# Patient Record
Sex: Female | Born: 1965
Health system: Southern US, Community
[De-identification: ages and names within clinical notes are randomized; demographics above are authoritative.]

## PROBLEM LIST (undated history)

## (undated) DIAGNOSIS — K644 Residual hemorrhoidal skin tags: Secondary | ICD-10-CM

## (undated) DIAGNOSIS — A159 Respiratory tuberculosis unspecified: Secondary | ICD-10-CM

## (undated) DIAGNOSIS — K648 Other hemorrhoids: Secondary | ICD-10-CM

## (undated) DIAGNOSIS — D509 Iron deficiency anemia, unspecified: Secondary | ICD-10-CM

## (undated) DIAGNOSIS — K56699 Other intestinal obstruction unspecified as to partial versus complete obstruction: Secondary | ICD-10-CM

## (undated) DIAGNOSIS — E538 Deficiency of other specified B group vitamins: Secondary | ICD-10-CM

## (undated) DIAGNOSIS — K602 Anal fissure, unspecified: Secondary | ICD-10-CM

## (undated) DIAGNOSIS — K5090 Crohn's disease, unspecified, without complications: Secondary | ICD-10-CM

## (undated) DIAGNOSIS — M1990 Unspecified osteoarthritis, unspecified site: Secondary | ICD-10-CM

## (undated) HISTORY — PX: APPENDECTOMY: SHX54

## (undated) HISTORY — DX: Residual hemorrhoidal skin tags: K64.4

## (undated) HISTORY — DX: Other hemorrhoids: K64.8

## (undated) HISTORY — DX: Gilbert syndrome: E80.4

## (undated) HISTORY — DX: Unspecified osteoarthritis, unspecified site: M19.90

## (undated) HISTORY — DX: Deficiency of other specified B group vitamins: E53.8

## (undated) HISTORY — PX: CHOLECYSTECTOMY: SHX55

## (undated) HISTORY — DX: Other intestinal obstruction unspecified as to partial versus complete obstruction: K56.699

## (undated) HISTORY — DX: Respiratory tuberculosis unspecified: A15.9

## (undated) HISTORY — PX: COLON RESECTION: SHX5231

## (undated) HISTORY — DX: Iron deficiency anemia, unspecified: D50.9

## (undated) HISTORY — PX: COLONOSCOPY: SHX174

## (undated) HISTORY — PX: UPPER GASTROINTESTINAL ENDOSCOPY: SHX188

## (undated) HISTORY — DX: Crohn's disease, unspecified, without complications: K50.90

## (undated) HISTORY — DX: Anal fissure, unspecified: K60.2

---

## 2000-03-22 HISTORY — PX: EXPLORATORY LAPAROTOMY: SUR591

## 2003-04-10 ENCOUNTER — Other Ambulatory Visit: Admission: RE | Admit: 2003-04-10 | Discharge: 2003-04-10 | Payer: Self-pay | Admitting: Obstetrics & Gynecology

## 2003-05-16 ENCOUNTER — Encounter: Admission: RE | Admit: 2003-05-16 | Discharge: 2003-05-16 | Payer: Self-pay | Admitting: Gastroenterology

## 2003-05-20 ENCOUNTER — Ambulatory Visit (HOSPITAL_COMMUNITY): Admission: RE | Admit: 2003-05-20 | Discharge: 2003-05-20 | Payer: Self-pay | Admitting: Gastroenterology

## 2003-05-20 ENCOUNTER — Encounter (INDEPENDENT_AMBULATORY_CARE_PROVIDER_SITE_OTHER): Payer: Self-pay | Admitting: *Deleted

## 2004-06-25 ENCOUNTER — Other Ambulatory Visit: Admission: RE | Admit: 2004-06-25 | Discharge: 2004-06-25 | Payer: Self-pay | Admitting: Obstetrics & Gynecology

## 2008-10-09 ENCOUNTER — Encounter: Admission: RE | Admit: 2008-10-09 | Discharge: 2008-10-09 | Payer: Self-pay

## 2009-05-15 ENCOUNTER — Ambulatory Visit (HOSPITAL_COMMUNITY): Admission: RE | Admit: 2009-05-15 | Discharge: 2009-05-15 | Payer: Self-pay | Admitting: Surgery

## 2010-02-10 ENCOUNTER — Encounter: Admission: RE | Admit: 2010-02-10 | Discharge: 2010-02-10 | Payer: Self-pay | Admitting: Obstetrics & Gynecology

## 2010-06-11 LAB — CBC
HCT: 40.6 % (ref 36.0–46.0)
Hemoglobin: 13.5 g/dL (ref 12.0–15.0)
MCHC: 33.3 g/dL (ref 30.0–36.0)
MCV: 93.2 fL (ref 78.0–100.0)
Platelets: 245 10*3/uL (ref 150–400)
RBC: 4.35 MIL/uL (ref 3.87–5.11)
RDW: 13.8 % (ref 11.5–15.5)
WBC: 9.1 10*3/uL (ref 4.0–10.5)

## 2010-06-11 LAB — COMPREHENSIVE METABOLIC PANEL
ALT: 12 U/L (ref 0–35)
AST: 20 U/L (ref 0–37)
Albumin: 3.3 g/dL — ABNORMAL LOW (ref 3.5–5.2)
Alkaline Phosphatase: 62 U/L (ref 39–117)
BUN: 9 mg/dL (ref 6–23)
CO2: 25 mEq/L (ref 19–32)
Calcium: 8.6 mg/dL (ref 8.4–10.5)
Chloride: 100 mEq/L (ref 96–112)
Creatinine, Ser: 0.67 mg/dL (ref 0.4–1.2)
GFR calc Af Amer: >60 mL/min (ref >60)
GFR calc non Af Amer: >60 mL/min (ref >60)
Glucose, Bld: 99 mg/dL (ref 70–99)
Potassium: 4 mEq/L (ref 3.5–5.1)
Sodium: 134 mEq/L — ABNORMAL LOW (ref 135–145)
Total Bilirubin: 1.2 mg/dL (ref 0.3–1.2)
Total Protein: 6.2 g/dL (ref 6.0–8.3)

## 2010-06-11 LAB — DIFFERENTIAL
Basophils Absolute: 0 10*3/uL (ref 0.0–0.1)
Basophils Relative: 0 % (ref 0–1)
Eosinophils Absolute: 0.1 10*3/uL (ref 0.0–0.7)
Eosinophils Relative: 1 % (ref 0–5)
Lymphocytes Relative: 24 % (ref 12–46)
Lymphs Abs: 2.1 10*3/uL (ref 0.7–4.0)
Monocytes Absolute: 0.8 10*3/uL (ref 0.1–1.0)
Monocytes Relative: 9 % (ref 3–12)
Neutro Abs: 6 10*3/uL (ref 1.7–7.7)
Neutrophils Relative %: 66 % (ref 43–77)

## 2010-08-07 NOTE — Op Note (Signed)
NAME:  Karen Knox, Karen Knox                       ACCOUNT NO.:  [id number]   MEDICAL RECORD NO.:  [id number]                   PATIENT TYPE:  AMB   LOCATION:  ENDO                                 FACILITY:  [facility]   PHYSICIAN:  Nelwyn Salisbury, M.D.               DATE OF BIRTH:  [birth year]   DATE OF PROCEDURE:  05/20/2003  DATE OF DISCHARGE:                                 OPERATIVE REPORT   PROCEDURE PERFORMED:  Colonoscopy with small bowel biopsies.   ENDOSCOPIST:  Nelwyn Salisbury, M.D.   INSTRUMENT USED:  Pediatric adjustable Olympus colonoscope.   INDICATIONS FOR PROCEDURE:  A 45 year old white female with a history of  Crohn's disease undergoing colonoscopy for what she thinks might be a recent  flare.  She is on Imuran and receives Remicade infusions every eight weeks.   PREPROCEDURE PREPARATION:  Informed consent was procured from the patient.  The patient was fasted for eight hours prior to the procedure and prepped  with a bottle of magnesium citrate and a gallon of GoLYTELY the night prior  to the procedure.   PREPROCEDURE PHYSICAL:  VITAL SIGNS:  The patient had stable vital signs.  NECK:  Supple.  CHEST: Clear to auscultation.  S1 and S2 regular.  ABDOMEN: Soft with normal bowel sounds.   DESCRIPTION OF PROCEDURE:  The patient was placed in the left lateral  decubitus position, sedated with 60 mg of Demerol and 6 mg of Versed  intravenously.  Once the patient was adequately sedated and maintained on  low flow oxygen and continuous cardiac monitoring, the Olympus video  colonoscope was advanced from the rectum to the anastomosis at 90 cm.  There  was ulceration at the anastomosis with scattered small bowel ulcerations  proximal to that.  Side-to-side anastomosis was noted.  Multiple biopsies of  these ulcers were done.  The entire colonic mucosa appeared healthy with  prominent internal hemorrhoids seen on retroflexion.  The patient tolerated  the procedure well without  immediate complications.   IMPRESSION:  1. Ulceration at anastomosis and in the distal small bowel.  Biopsies done.  2. Healthy-appearing colonic mucosa.  3. Prominent internal hemorrhoids.   RECOMMENDATIONS:  1. Await pathology results.  2. Follow up with upper GI and small bowel follow through results.  3. Outpatient follow up thereafter.  Further recommendations will be made     once the above-mentioned test results have been procured.  The patient     may require a surgical opinion for segmental resection if symptoms     worsen.                                               Nelwyn Salisbury, M.D.    JNM/MEDQ  D:  05/20/2003  T:  05/20/2003  Job:  [id number]   cc:   Thomos Lemons, M.D.  [employer]

## 2010-10-13 ENCOUNTER — Encounter: Payer: Self-pay | Admitting: Internal Medicine

## 2010-12-14 ENCOUNTER — Ambulatory Visit (INDEPENDENT_AMBULATORY_CARE_PROVIDER_SITE_OTHER): Payer: BC Managed Care – PPO | Admitting: Internal Medicine

## 2010-12-14 ENCOUNTER — Encounter: Payer: Self-pay | Admitting: Internal Medicine

## 2010-12-14 VITALS — BP 112/78 | HR 74 | Ht 62.0 in | Wt 112.0 lb

## 2010-12-14 DIAGNOSIS — R197 Diarrhea, unspecified: Secondary | ICD-10-CM

## 2010-12-14 DIAGNOSIS — K5010 Crohn's disease of large intestine without complications: Secondary | ICD-10-CM

## 2010-12-14 DIAGNOSIS — K5090 Crohn's disease, unspecified, without complications: Secondary | ICD-10-CM

## 2010-12-14 MED ORDER — HYDROCORTISONE ACE-PRAMOXINE 2.5-1 % RE CREA: TOPICAL_CREAM | Freq: Three times a day (TID) | RECTAL | Status: DC

## 2010-12-14 MED ORDER — PEG-KCL-NACL-NASULF-NA ASC-C 100 G PO SOLR: 1.0000 | Freq: Once | ORAL | Status: DC

## 2010-12-14 NOTE — Progress Notes (Signed)
Karen Knox 1965/09/06 MRN [id number]        History of Present Illness:  This is a 45 year old white female  status post terminal ileal resection in 1990 and subsequent  distal ileal resection in 2001 for anastomotic stricture. She has not seen a gastroenterologist in last 7 years. Colonoscopy in 2005 by Dr. Loreta Knox showed anastomotic Crohn's disease. She has been on Remicade for 9 years.at the dose of 5 mg per kilogram every 6 weeks administered by Dr. Ewell Poe office . She has chronic low-grade diarrhea. She denies aphthous stomatitis. She has had arthralgias controlled by Remicade. Her weight has remained stable. She has symptomatic hemorrhoids exacerbated by  diarrhea. There is a family history of colon cancer in her father. She had remote cholecystectomy in February 2011 call for cholelithiasis. She has been on B12 injections thousand micrograms every 2 weeks    Past Medical History  Diagnosis Date  . Anal fissure   . Crohn's disease   . Arthritis     ? IBD related?  . Small bowel stricture     multiple 07/05/00  . Gilbert's syndrome   . Iron deficiency anemia   . B12 deficiency   . External hemorrhoids   . Internal hemorrhoids    Past Surgical History  Procedure Date  . Exploratory laparotomy 2002    with extensive lysis of adhesions, resection of ileocolonic anastomosis,resection segment small bowel; anastomosis of small bowel  to small bowel; ileocolic anastomosis  . Appendectomy     with initial colon resection including terminal ileum  . Colon resection 1993  . Cholecystectomy     reports that she has never smoked. She has never used smokeless tobacco. She reports that she drinks alcohol. She reports that she does not use illicit drugs. family history includes Arthritis in her father; Colon cancer (age of onset:62) in her father; Melanoma in her mother; and Multiple sclerosis in her father. No Known Allergies      Review of Systems denies acid reflux.  Dysphagia odynophagia shortness of breath or chest pain   The remainder of the 10  point ROS is negative except as outlined in H&P   Physical Exam: General appearance  Well developed in no distress Eyes- non icteric HEENT nontraumatic, normocephalic Mouth no lesions, tongue papillated, no cheilosis Neck supple without adenopathy, thyroid not enlarged, no carotid bruits, no JVD Lungs Clear to auscultation bilaterally Cor normal S1 normal S2, regular rhythm , no murmur,  quiet precordium Abdomen  soft nontender with well-healed surgical scars. Hyperactive bowel sounds with soft abnormal rushes. Increased tympany throughout the abdomen  Rectal: large external skin tags. Normal rectal sphincter tone. Palpable nodularity in the rectal ampulla. Stool is Hemoccult negative. There is no fistula or drainage around the rectum  Extremities no pedal edema Skin no lesions Neurological alert and oriented x3 Psychological normal mood and  affect  ACrohn's disease of the terminal ileum status post remote resection x2. Doing reasonably well on Remicade. She will need a TB skin test. She is due for colonoscopy to assess the anastomosis. We talked about the use of probiotics a periodic antibiotics for bacterial overgrowth. She may not need B12 every 2 weeks but every month. She needs labs checked periodically. We prescribe Analpram cream 2.5% cream to use when necessary rectal irritations     12/14/2010 Karen Knox

## 2010-12-14 NOTE — Patient Instructions (Addendum)
You have been scheduled for a colonoscopy. Please follow written instructions given to you at your visit today.  Please pick up your Moviprep kit at the pharmacy within the next 2-3 days. We have sent the following medications to your pharmacy for you to pick up at your convenience: Analpram 2.5 % Please return here for your TB skin test reading between 48-72 hours from now. Cc: Dr Selena Batten

## 2010-12-15 ENCOUNTER — Encounter: Payer: Self-pay | Admitting: Internal Medicine

## 2011-01-25 ENCOUNTER — Ambulatory Visit (AMBULATORY_SURGERY_CENTER): Payer: BC Managed Care – PPO | Admitting: Internal Medicine

## 2011-01-25 ENCOUNTER — Encounter: Payer: Self-pay | Admitting: *Deleted

## 2011-01-25 ENCOUNTER — Encounter: Payer: Self-pay | Admitting: Internal Medicine

## 2011-01-25 DIAGNOSIS — K5190 Ulcerative colitis, unspecified, without complications: Secondary | ICD-10-CM

## 2011-01-25 DIAGNOSIS — K5289 Other specified noninfective gastroenteritis and colitis: Secondary | ICD-10-CM

## 2011-01-25 DIAGNOSIS — K5090 Crohn's disease, unspecified, without complications: Secondary | ICD-10-CM

## 2011-01-25 DIAGNOSIS — K5010 Crohn's disease of large intestine without complications: Secondary | ICD-10-CM

## 2011-01-25 DIAGNOSIS — R197 Diarrhea, unspecified: Secondary | ICD-10-CM

## 2011-01-25 DIAGNOSIS — K5080 Crohn's disease of both small and large intestine without complications: Secondary | ICD-10-CM

## 2011-01-25 MED ORDER — BUDESONIDE 3 MG PO CP24: 3.0000 mg | ORAL_CAPSULE | Freq: Three times a day (TID) | ORAL | Status: DC

## 2011-01-25 MED ORDER — SODIUM CHLORIDE 0.9 % IV SOLN: 500.0000 mL | INTRAVENOUS | Status: DC

## 2011-01-25 NOTE — Patient Instructions (Addendum)
Handouts given on crohns and polyps and hemorrhoids Discharge instructions per blue and green sheets Dr Juanda Chance did biopsies and we will mail you a letter in 1-2 weeks with the results and her recommendations.  At your next office visit you will discuss a more aggressive treatment with Dr Juanda Chance.  Repeat colonoscopy in 5 years- 2017. We will mail you a leter reminding you of this.  Call Dr Regino Schultze office tomorrow morning at [phone] to schedule your appointment for her next available time slot. To start entocort ec 3 mg 1 capsule by mouth 3 times a day.

## 2011-01-26 ENCOUNTER — Telehealth: Payer: Self-pay | Admitting: *Deleted

## 2011-01-26 NOTE — Telephone Encounter (Signed)
No answer. Message left on voicemail. 

## 2011-01-28 ENCOUNTER — Encounter: Payer: Self-pay | Admitting: Internal Medicine

## 2011-01-29 ENCOUNTER — Encounter: Payer: Self-pay | Admitting: Internal Medicine

## 2011-02-05 ENCOUNTER — Ambulatory Visit: Payer: BC Managed Care – PPO | Admitting: Internal Medicine

## 2011-02-26 ENCOUNTER — Ambulatory Visit (INDEPENDENT_AMBULATORY_CARE_PROVIDER_SITE_OTHER): Payer: BC Managed Care – PPO | Admitting: Internal Medicine

## 2011-02-26 ENCOUNTER — Other Ambulatory Visit: Payer: BC Managed Care – PPO

## 2011-02-26 ENCOUNTER — Encounter: Payer: Self-pay | Admitting: Internal Medicine

## 2011-02-26 VITALS — BP 136/60 | Ht 63.5 in | Wt 110.0 lb

## 2011-02-26 DIAGNOSIS — K5090 Crohn's disease, unspecified, without complications: Secondary | ICD-10-CM

## 2011-02-26 DIAGNOSIS — K5080 Crohn's disease of both small and large intestine without complications: Secondary | ICD-10-CM

## 2011-02-26 DIAGNOSIS — R197 Diarrhea, unspecified: Secondary | ICD-10-CM

## 2011-02-26 MED ORDER — CENTRUM SILVER PO TABS: 1.0000 | ORAL_TABLET | Freq: Every day | ORAL | Status: DC

## 2011-02-26 MED ORDER — MESALAMINE 1.2 G PO TBEC: DELAYED_RELEASE_TABLET | ORAL | Status: DC

## 2011-02-26 MED ORDER — INFLIXIMAB 100 MG IV SOLR: 10.0000 mg/kg | INTRAVENOUS | Status: DC

## 2011-02-26 MED ORDER — ALIGN 4 MG PO CAPS: 1.0000 | ORAL_CAPSULE | ORAL | Status: DC

## 2011-02-26 MED ORDER — CHOLESTYRAMINE 4 G PO PACK: 1.0000 | PACK | Freq: Every day | ORAL | Status: DC

## 2011-02-26 MED ORDER — ERGOCALCIFEROL 1.25 MG (50000 UT) PO CAPS: 50000.0000 [IU] | ORAL_CAPSULE | ORAL | Status: AC

## 2011-02-26 NOTE — Progress Notes (Signed)
Karen Knox Quant 1966-02-13 MRN [id number]    History of Present Illness:  This is a 45 year old white female with Crohn's disease of the terminal ileum who is post terminal ileal resection in 1990 and again in 2001. She is on Remicade 5 mg/kg every 6 weeks for the past 9 years. A recent colonoscopy 4 weeks ago showed Crohn's disease at the anastomosis. Biopsies from the distal ileum showed chronic active ileitis. Biopsies from the anastomosis showed ulcerated anastomotic mucosa with crypt distortion and patchy prominent plasma cells with neutrophilic infiltrate. Random biopsies from the colon showed patchy quiescent chronic colitis. A rectal polyp showed lymphoid hyperplasia. She has chronic diarrhea and occasional crampy abdominal pain. Her weight has been low but stable at around 112 pounds. She is here today to discuss increasing her treatment regimen for Crohn's disease. Her colonoscopy also showed mild narrowing in the distal ileum proximal to the anastomosis. She has been on Entocort 9 mg daily for 4 weeks without noticeable improvement of her symptoms. She was on Pentasa in the past but stopped taking it. She has used analpram cream for rectal Crohn's disease and uses B12 every 2 weeks.   Past Medical History  Diagnosis Date  . Anal fissure   . Crohn's disease   . Arthritis     ? IBD related?  . Small bowel stricture     multiple 07/05/00  . Gilbert's syndrome   . Iron deficiency anemia   . B12 deficiency   . External hemorrhoids   . Internal hemorrhoids    Past Surgical History  Procedure Date  . Exploratory laparotomy 2002    with extensive lysis of adhesions, resection of ileocolonic anastomosis,resection segment small bowel; anastomosis of small bowel  to small bowel; ileocolic anastomosis  . Appendectomy     with initial colon resection including terminal ileum  . Colon resection 1993  . Cholecystectomy     reports that she has never smoked. She has never used smokeless  tobacco. She reports that she drinks alcohol. She reports that she does not use illicit drugs. family history includes Arthritis in her father; Colon cancer (age of onset:62) in her father; Melanoma in her mother; and Multiple sclerosis in her father. No Known Allergies      Review of Systems: Denies dysphagia nausea shortness of breath or chest pain  The remainder of the 10 point ROS is negative except as outlined in H&P   Physical Exam: General appearance  Well developed, in no distress. Eyes- non icteric. HEENT nontraumatic, normocephalic. Mouth no lesions, tongue papillated, no cheilosis. No aphthous ulcers Neck supple without adenopathy, thyroid not enlarged, no carotid bruits, no JVD. Lungs Clear to auscultation bilaterally. Cor normal S1, normal S2, regular rhythm, no murmur,  quiet precordium. Abdomen: Soft relaxed with normoactive bowel sounds. No distention. Mild tenderness right lower quadrant. No mass effect. Rectal: Not repeated. Extremities no pedal edema. Skin no lesions., Thin hair and nails Neurological alert and oriented x 3. Psychological normal mood and affect.  Assessment and Plan:  Problem #1 Crohn's disease of the colon, anastomosis and terminal ileum as per recent colonoscopy. She also has involvement although not severe of the rectum. She has been on Remicade now for many years and needs to intensify her medical regimen. She has not noticed any improvement with Entocort. We will increase her Remicade to 10 mg/kg every 6 weeks. We are giving her a trial offLilda 1.2 g 4 tablets daily for the next 30 days. She will slowly  taper her Entocort to 6 mg for 4 weeks and subsequently 30 mg per 4 weeks before stopping it. He have discussed her diet and the need to be on a low-residue diet. We will call her in Questran 4 g packets daily at least 1 hour apart from other medications. She will continue her B12 every 4 weeks. She will also purchase protein supplements and  multiple vitamins with trace elements Centrum Silver. I have given her samples of a probiotic for bacteria overgrowth. We will check the vitamin D levels today and start her on vitamin D 50,000 units weekly for 12 weeks. I will see her again in 6 weeks.    02/26/2011 Karen Knox

## 2011-02-26 NOTE — Patient Instructions (Addendum)
We have sent the following medications to your pharmacy for you to pick up at your convenience: Questran 4 g packet once daily. Vitamin D 50,000 IU once weekly x 12 weeks We have given you samples of Align. This puts good bacteria back into your intestines. You should take 1 capsule by mouth once daily. If this works well for you, it can be purchased over the counter. We have given you samples of Lialda to take 4 tablets once daily. We will contact Dr Elmyra Ricks office with new Remicade orders for 10 mg/kg every 6 weeks. CC: Dr Selena Batten

## 2011-02-27 LAB — VITAMIN D 25 HYDROXY (VIT D DEFICIENCY, FRACTURES): Vit D, 25-Hydroxy: 47 ng/mL (ref 30–89)

## 2011-03-01 ENCOUNTER — Telehealth: Payer: Self-pay | Admitting: *Deleted

## 2011-03-01 NOTE — Telephone Encounter (Signed)
I have spoken to Dr Elmyra Ricks nurse, Dewayne Hatch to advise her that Dr Juanda Chance would like patient to begin on Remicade 10 mg/kg every 6 weeks instead of 5 mg/kg every 6 weeks. Ann verbalizes understanding and is aware that I have sent an updated order to their office.

## 2011-03-04 ENCOUNTER — Other Ambulatory Visit: Payer: Self-pay | Admitting: Internal Medicine

## 2011-03-26 ENCOUNTER — Telehealth: Payer: Self-pay | Admitting: Internal Medicine

## 2011-03-26 NOTE — Telephone Encounter (Signed)
Left voicemail for patient that she needs an appointment in order to get refills. Dr Juanda Chance wanted to see her 6 weeks after her last appointment which was on 02/26/11.

## 2011-03-28 ENCOUNTER — Other Ambulatory Visit: Payer: Self-pay | Admitting: Internal Medicine

## 2011-03-29 ENCOUNTER — Other Ambulatory Visit: Payer: Self-pay | Admitting: Internal Medicine

## 2011-03-29 MED ORDER — MESALAMINE 1.2 G PO TBEC: DELAYED_RELEASE_TABLET | ORAL | Status: DC

## 2011-03-29 NOTE — Telephone Encounter (Signed)
rx sent

## 2011-04-08 ENCOUNTER — Telehealth: Payer: Self-pay | Admitting: *Deleted

## 2011-04-08 NOTE — Telephone Encounter (Signed)
Received a phone call that Dr. Vincenza Hews Anderson's office does not have a copy of th orders sent to them on 03/01/11. Refaxed orders to [phone].

## 2011-04-20 ENCOUNTER — Other Ambulatory Visit (INDEPENDENT_AMBULATORY_CARE_PROVIDER_SITE_OTHER): Payer: BC Managed Care – PPO

## 2011-04-20 ENCOUNTER — Ambulatory Visit (INDEPENDENT_AMBULATORY_CARE_PROVIDER_SITE_OTHER): Payer: BC Managed Care – PPO | Admitting: Internal Medicine

## 2011-04-20 ENCOUNTER — Encounter: Payer: Self-pay | Admitting: Internal Medicine

## 2011-04-20 VITALS — BP 118/64 | HR 76 | Ht 63.0 in | Wt 110.0 lb

## 2011-04-20 DIAGNOSIS — K5090 Crohn's disease, unspecified, without complications: Secondary | ICD-10-CM

## 2011-04-20 DIAGNOSIS — Z79899 Other long term (current) drug therapy: Secondary | ICD-10-CM

## 2011-04-20 DIAGNOSIS — D849 Immunodeficiency, unspecified: Secondary | ICD-10-CM

## 2011-04-20 DIAGNOSIS — K5080 Crohn's disease of both small and large intestine without complications: Secondary | ICD-10-CM

## 2011-04-20 LAB — COMPREHENSIVE METABOLIC PANEL
ALT: 22 U/L (ref 0–35)
AST: 19 U/L (ref 0–37)
Albumin: 3.4 g/dL — ABNORMAL LOW (ref 3.5–5.2)
Alkaline Phosphatase: 53 U/L (ref 39–117)
BUN: 13 mg/dL (ref 6–23)
CO2: 27 mEq/L (ref 19–32)
Calcium: 8.6 mg/dL (ref 8.4–10.5)
Chloride: 105 mEq/L (ref 96–112)
Creatinine, Ser: 0.6 mg/dL (ref 0.4–1.2)
GFR: 107.48 mL/min (ref >60.00)
Glucose, Bld: 80 mg/dL (ref 70–99)
Potassium: 3.2 mEq/L — ABNORMAL LOW (ref 3.5–5.1)
Sodium: 139 mEq/L (ref 135–145)
Total Bilirubin: 2 mg/dL — ABNORMAL HIGH (ref 0.3–1.2)
Total Protein: 6.8 g/dL (ref 6.0–8.3)

## 2011-04-20 LAB — CBC WITH DIFFERENTIAL/PLATELET
Basophils Absolute: 0 10*3/uL (ref 0.0–0.1)
Basophils Relative: 0.7 % (ref 0.0–3.0)
Eosinophils Absolute: 0.1 10*3/uL (ref 0.0–0.7)
Eosinophils Relative: 1.4 % (ref 0.0–5.0)
HCT: 41.7 % (ref 36.0–46.0)
Hemoglobin: 13.8 g/dL (ref 12.0–15.0)
Lymphocytes Relative: 36.5 % (ref 12.0–46.0)
Lymphs Abs: 2 10*3/uL (ref 0.7–4.0)
MCHC: 33.1 g/dL (ref 30.0–36.0)
MCV: 87.7 fl (ref 78.0–100.0)
Monocytes Absolute: 0.6 10*3/uL (ref 0.1–1.0)
Monocytes Relative: 10.1 % (ref 3.0–12.0)
Neutro Abs: 2.9 10*3/uL (ref 1.4–7.7)
Neutrophils Relative %: 51.3 % (ref 43.0–77.0)
Platelets: 328 10*3/uL (ref 150.0–400.0)
RBC: 4.76 Mil/uL (ref 3.87–5.11)
RDW: 14.7 % — ABNORMAL HIGH (ref 11.5–14.6)
WBC: 5.6 10*3/uL (ref 4.5–10.5)

## 2011-04-20 LAB — SEDIMENTATION RATE: Sed Rate: 10 mm/hr (ref 0–22)

## 2011-04-20 MED ORDER — HYDROCORTISONE ACE-PRAMOXINE 2.5-1 % RE CREA: TOPICAL_CREAM | Freq: Three times a day (TID) | RECTAL | Status: DC

## 2011-04-20 NOTE — Patient Instructions (Addendum)
Your physician has requested that you go to the basement for the following lab work before leaving today: CBC, CMET, Sedimentation Rate We have sent the following medications to your pharmacy for you to pick up at your convenience: Analpram Please return to see Dr Juanda Chance in 3 months. CC: Dr Pearson Grippe

## 2011-04-20 NOTE — Progress Notes (Signed)
Karen Knox May 09, 1965 MRN [id number]    History of Present Illness:  This is a 46 year old white female with Crohn's disease at the ileocolic anastomosis and of the colon as per a recent colonoscopy in November 2012 which showed chronic active ileitis, ulcerated mucosa with crypt distortion and patchy prominent plasma cells as well as neutrophils. Random biopsies of the colon showed patchy quiescent colitis. The anastomosis was mildly stenosed. She had a prior terminal ileal resection in 1996 and again in 2001. We have increased her Remicade to 10 mg/kg every 8 weeks. Her Entocort has been tapered down to 3 mg a day. She is feeling overall better. The mild joint pains in her hands and her level of energy has been better,. The diarrhea remains about the same. She has not taken any Questran. She continues on mesalamine 4.8 g daily. She is on B12 supplements every 4 weeks, probiotics, vitamin D and Analpram cream.   Past Medical History  Diagnosis Date  . Anal fissure   . Crohn's disease   . Arthritis     ? IBD related?  . Small bowel stricture     multiple 07/05/00  . Gilbert's syndrome   . Iron deficiency anemia   . B12 deficiency   . External hemorrhoids   . Internal hemorrhoids    Past Surgical History  Procedure Date  . Exploratory laparotomy 2002    with extensive lysis of adhesions, resection of ileocolonic anastomosis,resection segment small bowel; anastomosis of small bowel  to small bowel; ileocolic anastomosis  . Appendectomy     with initial colon resection including terminal ileum  . Colon resection 1993  . Cholecystectomy     reports that she has never smoked. She has never used smokeless tobacco. She reports that she drinks alcohol. She reports that she does not use illicit drugs. family history includes Arthritis in her father; Colon cancer (age of onset:62) in her father; Melanoma in her mother; and Multiple sclerosis in her father. No Known Allergies       Review of Systems: Positive for diarrhea which has been improved on a low residue diet. Denies abdominal pain. Denies nausea. Weight has been stable  The remainder of the 10 point ROS is negative except as outlined in H&P   Physical Exam: General appearance  Well developed, in no distress. Eyes- non icteric. HEENT nontraumatic, normocephalic. Mouth no lesions, tongue papillated, no cheilosis. Neck supple without adenopathy, thyroid not enlarged, no carotid bruits, no JVD. Lungs Clear to auscultation bilaterally. Cor normal S1, normal S2, regular rhythm, no murmur,  quiet precordium. Abdomen: Soft abdomen with minimal tenderness in right lower quadrant and normal active bowel sounds. No distention. Rectal not done. Extremities no pedal edema., swollen right MCP joint right hand. Skin no lesions. Neurological alert and oriented x 3. Psychological normal mood and affect.  Assessment and Plan:  Problem #1 Active Crohn's colitis and ileitis at the ileocolonic anastomosis which has been resected twice. She is on complex medical therapy which she has been tolerating well. She will taper off her Entocort. She will continue all other medications. We will recheck her labs today and we will see her again in 3 months.   04/20/2011 Karen Knox

## 2011-04-21 ENCOUNTER — Telehealth: Payer: Self-pay | Admitting: *Deleted

## 2011-04-21 MED ORDER — POTASSIUM CHLORIDE CRYS ER 20 MEQ PO TBCR: EXTENDED_RELEASE_TABLET | ORAL | Status: DC

## 2011-04-21 NOTE — Telephone Encounter (Signed)
Spoke with patient and gave her results and recommendations. Rx to pharmacy.

## 2011-04-21 NOTE — Telephone Encounter (Signed)
Message copied by Daphine Deutscher on Wed Apr 21, 2011  2:39 PM ------      Message from: Hart Carwin      Created: Tue Apr 20, 2011  9:52 PM       Please call pt with normal blood tests except for low K+, due to diarrhea. Please call in [facility] , take 1 poqd x 1 week, #301 refill, , may repeat in case of severe diarrhea

## 2011-04-29 ENCOUNTER — Other Ambulatory Visit: Payer: Self-pay | Admitting: Internal Medicine

## 2011-06-08 ENCOUNTER — Other Ambulatory Visit: Payer: Self-pay | Admitting: Internal Medicine

## 2011-06-17 ENCOUNTER — Other Ambulatory Visit: Payer: Self-pay | Admitting: Internal Medicine

## 2011-07-02 ENCOUNTER — Other Ambulatory Visit: Payer: Self-pay | Admitting: Internal Medicine

## 2011-09-18 ENCOUNTER — Other Ambulatory Visit: Payer: Self-pay | Admitting: Internal Medicine

## 2011-09-21 ENCOUNTER — Other Ambulatory Visit: Payer: Self-pay | Admitting: Internal Medicine

## 2011-09-21 MED ORDER — MESALAMINE 1.2 G PO TBEC: DELAYED_RELEASE_TABLET | ORAL | Status: DC

## 2011-09-21 NOTE — Telephone Encounter (Signed)
rx sent

## 2011-10-22 ENCOUNTER — Ambulatory Visit: Payer: BC Managed Care – PPO

## 2011-10-22 ENCOUNTER — Ambulatory Visit (INDEPENDENT_AMBULATORY_CARE_PROVIDER_SITE_OTHER): Payer: BC Managed Care – PPO | Admitting: Internal Medicine

## 2011-10-22 ENCOUNTER — Encounter: Payer: Self-pay | Admitting: Internal Medicine

## 2011-10-22 VITALS — BP 138/80 | HR 107 | Ht 63.0 in | Wt 111.4 lb

## 2011-10-22 DIAGNOSIS — K5090 Crohn's disease, unspecified, without complications: Secondary | ICD-10-CM

## 2011-10-22 LAB — CBC WITH DIFFERENTIAL/PLATELET
Basophils Absolute: 0 10*3/uL (ref 0.0–0.1)
Basophils Relative: 0 % (ref 0–1)
Eosinophils Absolute: 0.1 10*3/uL (ref 0.0–0.7)
Eosinophils Relative: 1 % (ref 0–5)
HCT: 42 % (ref 36.0–46.0)
Hemoglobin: 13.7 g/dL (ref 12.0–15.0)
Lymphocytes Relative: 33 % (ref 12–46)
Lymphs Abs: 3.2 10*3/uL (ref 0.7–4.0)
MCH: 28.6 pg (ref 26.0–34.0)
MCHC: 32.6 g/dL (ref 30.0–36.0)
MCV: 87.7 fL (ref 78.0–100.0)
Monocytes Absolute: 1.1 10*3/uL — ABNORMAL HIGH (ref 0.1–1.0)
Monocytes Relative: 11 % (ref 3–12)
Neutro Abs: 5.4 10*3/uL (ref 1.7–7.7)
Neutrophils Relative %: 55 % (ref 43–77)
Platelets: 326 10*3/uL (ref 150–400)
RBC: 4.79 MIL/uL (ref 3.87–5.11)
RDW: 14.1 % (ref 11.5–15.5)
WBC: 9.9 10*3/uL (ref 4.0–10.5)

## 2011-10-22 LAB — SEDIMENTATION RATE: Sed Rate: 4 mm/hr (ref 0–22)

## 2011-10-22 MED ORDER — MESALAMINE 1.2 G PO TBEC: DELAYED_RELEASE_TABLET | ORAL | Status: DC

## 2011-10-22 MED ORDER — BUDESONIDE 3 MG PO CP24: ORAL_CAPSULE | ORAL | Status: DC

## 2011-10-22 MED ORDER — HYDROCORTISONE ACE-PRAMOXINE 2.5-1 % RE CREA: TOPICAL_CREAM | Freq: Three times a day (TID) | RECTAL | Status: DC

## 2011-10-22 NOTE — Progress Notes (Signed)
Karen Knox 1965-10-16 MRN [id number]    History of Present Illness:  This is a 46 year old white female with Crohn's disease of the terminal ileum who comes for refills of her medications. Her last appointment was in January 2013. She has an ileocolic anastomosis and is status post terminal ileum resection in 1996 and 2001. She also had a cholecystectomy. A colonoscopy in November 2012 showed chronic active ileitis, ulcerated mucosa with crypt distortion and patchy plasma cell and neutrophilic infiltrate in the colon. She has been on Remicade infusions 10 mg/kg every 6 weeks. She has chronic diarrhea which at times gets worse but she usually doesn't take anything other than occasional Questran. In addition to Remicade infusions, she is also on mesalamine 4.8 g daily. In the last several weeks, she has had  increased pain in right lower quadrant. She denies any obstructive symptoms, increased nausea, vomiting or pain. She has occasional irritation of the rectum secondary to diarrhea and she uses topical steroids for it. There is a history of Gilbert syndrome. Her weight has been stable. She is on B12 supplements monthly.   Past Medical History  Diagnosis Date  . Anal fissure   . Crohn's disease   . Arthritis     ? IBD related?  . Small bowel stricture     multiple 07/05/00  . Gilbert's syndrome   . Iron deficiency anemia   . B12 deficiency   . External hemorrhoids   . Internal hemorrhoids    Past Surgical History  Procedure Date  . Exploratory laparotomy 2002    with extensive lysis of adhesions, resection of ileocolonic anastomosis,resection segment small bowel; anastomosis of small bowel  to small bowel; ileocolic anastomosis  . Appendectomy     with initial colon resection including terminal ileum  . Colon resection 1993  . Cholecystectomy     reports that she has never smoked. She has never used smokeless tobacco. She reports that she drinks alcohol. She reports that she does  not use illicit drugs. family history includes Arthritis in her father; Colon cancer (age of onset:62) in her father; Melanoma in her mother; and Multiple sclerosis in her father. No Known Allergies      Review of Systems: Negative for reflux dysphagia  The remainder of the 10 point ROS is negative except as outlined in H&P   Physical Exam: General appearance  Well developed, in no distress. Eyes- non icteric. HEENT nontraumatic, normocephalic. Mouth no lesions, tongue papillated, no cheilosis. Neck supple without adenopathy, thyroid not enlarged, no carotid bruits, no JVD. Lungs Clear to auscultation bilaterally. Cor normal S1, normal S2, regular rhythm, no murmur,  quiet precordium. Abdomen: Very tender right lower quadrant with hyperactive bowel sounds. No rebound. Well-healed surgical scar. No fluid wave. Rectal: External hemorrhoidal tag. Normal rectal sphincter tone with nodule on anal canal secondary to Crohn's disease. Extremities no pedal edema. Skin no lesions. Neurological alert and oriented x 3. Psychological normal mood and affect.  Assessment and Plan:  Problem #1 Crohn's disease of the ileocolic anastomosis and of the colon and distal ileum she is on Remicade infusions and mesalamine. She has had a mild exacerbation recently. We will start her Entocort 9 mg daily for 2 weeks then down to 6 mg for 2 weeks then 3 mg for 2 weeks. We will refill her Analpram cream and her mesalamine. She will have labs today to check on her potassium, blood count and I will see her in 6 months. Her next Remicade infusion  will be August 23. She follows with Dr. Dareen Piano for IBD arthropathy.   10/22/2011 [name]

## 2011-10-22 NOTE — Patient Instructions (Addendum)
Your physician has requested that you go to the basement for the following lab work before leaving today: CMET, Sed Rate, CBC We have sent the following medications to your pharmacy for you to pick up at your convenience: Analpram Lialda Entocort Please follow up with Dr Juanda Chance in 6 months. CC: Dr Pearson Grippe, Dr Dareen Piano

## 2011-10-23 ENCOUNTER — Encounter: Payer: Self-pay | Admitting: Internal Medicine

## 2011-10-23 LAB — COMPREHENSIVE METABOLIC PANEL
ALT: 13 U/L (ref 0–35)
AST: 17 U/L (ref 0–37)
Albumin: 3.8 g/dL (ref 3.5–5.2)
Alkaline Phosphatase: 51 U/L (ref 39–117)
BUN: 11 mg/dL (ref 6–23)
CO2: 24 mEq/L (ref 19–32)
Calcium: 8.7 mg/dL (ref 8.4–10.5)
Chloride: 103 mEq/L (ref 96–112)
Creat: 0.6 mg/dL (ref 0.50–1.10)
Glucose, Bld: 63 mg/dL — ABNORMAL LOW (ref 70–99)
Potassium: 3.4 mEq/L — ABNORMAL LOW (ref 3.5–5.3)
Sodium: 138 mEq/L (ref 135–145)
Total Bilirubin: 2.2 mg/dL — ABNORMAL HIGH (ref 0.3–1.2)
Total Protein: 6.6 g/dL (ref 6.0–8.3)

## 2012-06-29 ENCOUNTER — Other Ambulatory Visit: Payer: Self-pay | Admitting: Internal Medicine

## 2012-09-08 ENCOUNTER — Other Ambulatory Visit: Payer: Self-pay | Admitting: Internal Medicine

## 2012-09-08 NOTE — Telephone Encounter (Signed)
NEEDS OFFICE VISIT FOR ANY FURTHER REFILLS! 

## 2012-11-14 ENCOUNTER — Other Ambulatory Visit: Payer: Self-pay | Admitting: Internal Medicine

## 2012-12-05 ENCOUNTER — Ambulatory Visit (INDEPENDENT_AMBULATORY_CARE_PROVIDER_SITE_OTHER): Payer: BC Managed Care – PPO | Admitting: Internal Medicine

## 2012-12-05 ENCOUNTER — Other Ambulatory Visit (INDEPENDENT_AMBULATORY_CARE_PROVIDER_SITE_OTHER): Payer: BC Managed Care – PPO

## 2012-12-05 ENCOUNTER — Encounter: Payer: Self-pay | Admitting: Internal Medicine

## 2012-12-05 VITALS — BP 130/84 | HR 60 | Ht 63.0 in | Wt 110.4 lb

## 2012-12-05 DIAGNOSIS — R197 Diarrhea, unspecified: Secondary | ICD-10-CM

## 2012-12-05 DIAGNOSIS — D849 Immunodeficiency, unspecified: Secondary | ICD-10-CM

## 2012-12-05 DIAGNOSIS — K5080 Crohn's disease of both small and large intestine without complications: Secondary | ICD-10-CM

## 2012-12-05 LAB — VITAMIN B12: Vitamin B-12: 838 pg/mL (ref 211–911)

## 2012-12-05 LAB — IBC PANEL
Iron: 50 ug/dL (ref 42–145)
Saturation Ratios: 10.1 % — ABNORMAL LOW (ref 20.0–50.0)
Transferrin: 353.2 mg/dL (ref 212.0–360.0)

## 2012-12-05 LAB — COMPREHENSIVE METABOLIC PANEL
ALT: 20 U/L (ref 0–35)
AST: 17 U/L (ref 0–37)
Albumin: 3.5 g/dL (ref 3.5–5.2)
Alkaline Phosphatase: 60 U/L (ref 39–117)
BUN: 11 mg/dL (ref 6–23)
CO2: 30 mEq/L (ref 19–32)
Calcium: 8.4 mg/dL (ref 8.4–10.5)
Chloride: 100 mEq/L (ref 96–112)
Creatinine, Ser: 0.7 mg/dL (ref 0.4–1.2)
GFR: 90.22 mL/min (ref >60.00)
Glucose, Bld: 95 mg/dL (ref 70–99)
Potassium: 3.7 mEq/L (ref 3.5–5.1)
Sodium: 136 mEq/L (ref 135–145)
Total Bilirubin: 1.5 mg/dL — ABNORMAL HIGH (ref 0.3–1.2)
Total Protein: 7 g/dL (ref 6.0–8.3)

## 2012-12-05 LAB — SEDIMENTATION RATE: Sed Rate: 14 mm/hr (ref 0–22)

## 2012-12-05 LAB — HEPATITIS B SURFACE ANTIGEN: Hepatitis B Surface Ag: NEGATIVE

## 2012-12-05 LAB — HEPATITIS B SURFACE ANTIBODY,QUALITATIVE: Hep B S Ab: NEGATIVE

## 2012-12-05 MED ORDER — POTASSIUM CHLORIDE CRYS ER 20 MEQ PO TBCR: EXTENDED_RELEASE_TABLET | ORAL | Status: DC

## 2012-12-05 MED ORDER — MESALAMINE 1.2 G PO TBEC: DELAYED_RELEASE_TABLET | ORAL | Status: DC

## 2012-12-05 MED ORDER — HYDROCORTISONE ACE-PRAMOXINE 2.5-1 % RE CREA: TOPICAL_CREAM | Freq: Three times a day (TID) | RECTAL | Status: DC

## 2012-12-05 NOTE — Patient Instructions (Addendum)
We have sent the following medications to your pharmacy for you to pick up at your convenience: Analpram Lialda Potassium  Your physician has requested that you go to the basement for the following lab work before leaving today: Hepatitis A and B serologies, B12, IBC, Sed Rate, CMET  We will call you with a TB skin test date.   CC: Dr Azzie Roup

## 2012-12-05 NOTE — Progress Notes (Signed)
Karen Knox June 02, 1965 MRN [id number]  History of Present Illness:  This is a 47 year old white female with known Crohn's disease of the terminal ileum, the rectum and colon. She had a terminal ileal resection in 1992 and again in June 2002 in [facility]. She also had a laparoscopic cholecystectomy. Patient has been on Remicade since 03/22/2001. Most recently, she has been on 10 mg/kg every 6 weeks. She has  IBD arthropathy, B12 deficiency and a history of anal fissures. She is having 4-5 loose bowel movements a day. Prior colonoscopies in April 2002, February 2003, February 2005 and November 2012 showed chronic active colitis and ulcerated mucosa with crypt distortion neutrophilic infiltrate consistent with active colitis at the anastomosis. She is doing well. Her weight has been stable. She denies rectal bleeding. She just completed a bone density. She also had a pneumococcal vaccine in 2013. She needs TB skin testing and updated blood tests. She needs a refill on her mesalamine, cortisone cream and potassium supplements.   Past Medical History  Diagnosis Date  . Anal fissure   . Crohn's disease   . Arthritis     ? IBD related?  . Small bowel stricture     multiple 07/05/00  . Gilbert's syndrome   . Iron deficiency anemia   . B12 deficiency   . External hemorrhoids   . Internal hemorrhoids    Past Surgical History  Procedure Laterality Date  . Exploratory laparotomy  2002    with extensive lysis of adhesions, resection of ileocolonic anastomosis,resection segment small bowel; anastomosis of small bowel  to small bowel; ileocolic anastomosis  . Appendectomy      with initial colon resection including terminal ileum  . Colon resection  1993  . Cholecystectomy      reports that she has never smoked. She has never used smokeless tobacco. She reports that  drinks alcohol. She reports that she does not use illicit drugs. family history includes Arthritis in her father;  Colon cancer (age of onset: 17) in her father; Melanoma in her mother; Multiple sclerosis in her father. No Known Allergies      Review of Systems: Denies heartburn dysphagia rectal bleeding  The remainder of the 10 point ROS is negative except as outlined in H&P   Physical Exam: General appearance  Well developed, in no distress. Eyes- non icteric. HEENT nontraumatic, normocephalic. Mouth no lesions, tongue papillated, no cheilosis. Neck supple without adenopathy, thyroid not enlarged, no carotid bruits, no JVD. Lungs Clear to auscultation bilaterally. Cor normal S1, normal S2, regular rhythm, no murmur,  quiet precordium. Abdomen: Soft with good muscle tone. Tenderness in right lower quadrant. No mass, hyperactive bowel sounds. Rectal: External skin tags. Normal rectal sphincter tone slightly tender. Stool is Hemoccult negative. Extremities no pedal edema. Skin no lesions. Neurological alert and oriented x 3. Psychological normal mood and affect.  Assessment and Plan:  Problem #4 47 year old white female with stable Crohn's disease of the terminal ileum and colon . She will continue on Remicade infusions. We will check on her hepatitis serologies and refill mesalamine, hydrocortisone cream and potassium. We will also check B12 levels, iron levels and liver function tests. A TB skin test will be applied when we get the serum in.   12/05/2012 Karen Knox

## 2012-12-06 ENCOUNTER — Other Ambulatory Visit: Payer: Self-pay | Admitting: *Deleted

## 2012-12-06 DIAGNOSIS — D509 Iron deficiency anemia, unspecified: Secondary | ICD-10-CM

## 2012-12-06 LAB — HEPATITIS A ANTIBODY, TOTAL: Hep A Total Ab: NEGATIVE

## 2012-12-22 ENCOUNTER — Telehealth: Payer: Self-pay | Admitting: Internal Medicine

## 2012-12-22 ENCOUNTER — Ambulatory Visit (INDEPENDENT_AMBULATORY_CARE_PROVIDER_SITE_OTHER): Payer: BC Managed Care – PPO | Admitting: Internal Medicine

## 2012-12-22 DIAGNOSIS — K529 Noninfective gastroenteritis and colitis, unspecified: Secondary | ICD-10-CM

## 2012-12-22 DIAGNOSIS — K5289 Other specified noninfective gastroenteritis and colitis: Secondary | ICD-10-CM

## 2012-12-22 MED ORDER — TUBERCULIN PPD 5 UNIT/0.1ML ID SOLN: 5.0000 [IU] | Freq: Once | INTRADERMAL | Status: DC

## 2012-12-22 NOTE — Telephone Encounter (Signed)
Spoke to Robin at [facility]. Advised her that patient states that she got a tube of analpram that says "#60 grams" but is on a 30 gram tube and she only got 1 tube. Advised that patient should have received 2 tubes. Zella Ball states that she will get an additional tube of Analpram ready for the patien to pick up at no charge. Patient also advised of this and I asked that in the future, she open the medication bags while at the pharmacy to make certain that she truly is getting 60 grams. She verbalizes understanding.

## 2013-02-01 ENCOUNTER — Telehealth: Payer: Self-pay | Admitting: *Deleted

## 2013-02-01 NOTE — Telephone Encounter (Signed)
Message copied by Daphine Deutscher on Thu Feb 01, 2013  8:24 AM ------      Message from: Daphine Deutscher      Created: Wed Dec 06, 2012  9:10 AM       Call and remind patient due for IBC panel for DB on 02/05/13. Lab in EPIC ------

## 2013-02-01 NOTE — Telephone Encounter (Signed)
Spoke with patient and she just had labs drawn at [facility]. She will see if they drew an iron panel or if they will add one and send [place] the results. If they cannot do this, she will come for labs here next week.

## 2013-02-21 ENCOUNTER — Telehealth: Payer: Self-pay | Admitting: *Deleted

## 2013-02-21 NOTE — Telephone Encounter (Signed)
Spoke with patient and she will come for labs sometime in the near future. Her PCP did not draw it.

## 2013-02-21 NOTE — Telephone Encounter (Signed)
Message copied by Daphine Deutscher on Wed Feb 21, 2013  2:26 PM ------      Message from: Daphine Deutscher      Created: Thu Feb 01, 2013  8:29 AM       Did patient have labs for DB or have them faxed from [facility]? ------

## 2013-03-08 ENCOUNTER — Other Ambulatory Visit: Payer: Self-pay | Admitting: Internal Medicine

## 2013-04-09 ENCOUNTER — Encounter: Payer: Self-pay | Admitting: *Deleted

## 2013-04-20 ENCOUNTER — Telehealth: Payer: Self-pay | Admitting: *Deleted

## 2013-04-20 NOTE — Telephone Encounter (Signed)
Patient was due for iron studies. Called and mailed a letter to patient. She has not had labs done.

## 2013-04-25 ENCOUNTER — Other Ambulatory Visit (INDEPENDENT_AMBULATORY_CARE_PROVIDER_SITE_OTHER): Payer: BC Managed Care – PPO

## 2013-04-25 DIAGNOSIS — D509 Iron deficiency anemia, unspecified: Secondary | ICD-10-CM

## 2013-04-25 LAB — IBC PANEL
Iron: 80 ug/dL (ref 42–145)
Saturation Ratios: 17.9 % — ABNORMAL LOW (ref 20.0–50.0)
Transferrin: 319.4 mg/dL (ref 212.0–360.0)

## 2013-06-17 ENCOUNTER — Other Ambulatory Visit: Payer: Self-pay | Admitting: Internal Medicine

## 2013-09-18 ENCOUNTER — Other Ambulatory Visit: Payer: Self-pay | Admitting: Internal Medicine

## 2013-09-26 ENCOUNTER — Other Ambulatory Visit: Payer: Self-pay | Admitting: Internal Medicine

## 2013-12-25 ENCOUNTER — Ambulatory Visit (INDEPENDENT_AMBULATORY_CARE_PROVIDER_SITE_OTHER): Payer: BC Managed Care – PPO | Admitting: Internal Medicine

## 2013-12-25 ENCOUNTER — Other Ambulatory Visit (INDEPENDENT_AMBULATORY_CARE_PROVIDER_SITE_OTHER): Payer: BC Managed Care – PPO

## 2013-12-25 ENCOUNTER — Encounter: Payer: Self-pay | Admitting: Internal Medicine

## 2013-12-25 VITALS — BP 126/84 | HR 68 | Ht 63.0 in | Wt 114.4 lb

## 2013-12-25 DIAGNOSIS — K5080 Crohn's disease of both small and large intestine without complications: Secondary | ICD-10-CM

## 2013-12-25 DIAGNOSIS — R195 Other fecal abnormalities: Secondary | ICD-10-CM

## 2013-12-25 DIAGNOSIS — K5010 Crohn's disease of large intestine without complications: Secondary | ICD-10-CM

## 2013-12-25 DIAGNOSIS — Z9225 Personal history of immunosupression therapy: Secondary | ICD-10-CM

## 2013-12-25 LAB — CBC WITH DIFFERENTIAL/PLATELET
Basophils Absolute: 0 10*3/uL (ref 0.0–0.1)
Basophils Relative: 0.4 % (ref 0.0–3.0)
Eosinophils Absolute: 0.1 10*3/uL (ref 0.0–0.7)
Eosinophils Relative: 1.5 % (ref 0.0–5.0)
HCT: 42 % (ref 36.0–46.0)
Hemoglobin: 14 g/dL (ref 12.0–15.0)
Lymphocytes Relative: 32 % (ref 12.0–46.0)
Lymphs Abs: 2.4 10*3/uL (ref 0.7–4.0)
MCHC: 33.4 g/dL (ref 30.0–36.0)
MCV: 88.4 fl (ref 78.0–100.0)
Monocytes Absolute: 0.7 10*3/uL (ref 0.1–1.0)
Monocytes Relative: 8.7 % (ref 3.0–12.0)
Neutro Abs: 4.3 10*3/uL (ref 1.4–7.7)
Neutrophils Relative %: 57.4 % (ref 43.0–77.0)
Platelets: 356 10*3/uL (ref 150.0–400.0)
RBC: 4.75 Mil/uL (ref 3.87–5.11)
RDW: 13.5 % (ref 11.5–15.5)
WBC: 7.5 10*3/uL (ref 4.0–10.5)

## 2013-12-25 LAB — SEDIMENTATION RATE: Sed Rate: 10 mm/hr (ref 0–22)

## 2013-12-25 MED ORDER — POTASSIUM CHLORIDE CRYS ER 20 MEQ PO TBCR: 20.0000 meq | EXTENDED_RELEASE_TABLET | Freq: Every day | ORAL | Status: DC

## 2013-12-25 MED ORDER — CYANOCOBALAMIN 1000 MCG/ML IJ SOLN: 1000.0000 ug | INTRAMUSCULAR | Status: DC

## 2013-12-25 MED ORDER — MESALAMINE 1.2 G PO TBEC: DELAYED_RELEASE_TABLET | ORAL | Status: DC

## 2013-12-25 MED ORDER — HYDROCORTISONE ACE-PRAMOXINE 2.5-1 % RE CREA: TOPICAL_CREAM | Freq: Three times a day (TID) | RECTAL | Status: DC

## 2013-12-25 NOTE — Progress Notes (Signed)
Karen Knox 10/24/1965 [id number]  Note: This dictation was prepared with Dragon digital system. Any transcriptional errors that result from this procedure are unintentional.   History of Present Illness: This is a 48 year old white female with Crohn's disease of the terminal ileum and colon. She had a terminal ileum resection in 1992 and again in June 2002 at [facility]. She has been on Remicade 10 mg/kg every 6 weeks since January 2003. She has IBD arthropathy. Patient has a history of a prior cholecystectomy. Her prior colonoscopies were in 2002, 2003, 2005 and in November 2012 and showed chronic active colitis as well as ileitis including the ileocolic anastomosis. There is a family history of colon cancer in her father. Her last lab work in January 2015 showed an iron saturation of 17% and B12 level of 838. She is here today for refills of her medications. She has chronic diarrhea. Her last office visit was in September 2014. She has no specific complaints today. There has been no rectal bleeding, abdominal pain or weight loss. She is having 2-3 loose bowel movements a day.    Past Medical History  Diagnosis Date  . Anal fissure   . Crohn's disease   . Arthritis     ? IBD related?  . Small bowel stricture     multiple 07/05/00  . Gilbert's syndrome   . Iron deficiency anemia   . B12 deficiency   . External hemorrhoids   . Internal hemorrhoids     Past Surgical History  Procedure Laterality Date  . Exploratory laparotomy  2002    with extensive lysis of adhesions, resection of ileocolonic anastomosis,resection segment small bowel; anastomosis of small bowel  to small bowel; ileocolic anastomosis  . Appendectomy      with initial colon resection including terminal ileum  . Colon resection  1993  . Cholecystectomy      No Known Allergies  Family history and social history have been reviewed.  Review of Systems:   The remainder of the 10 point ROS is  negative except as outlined in the H&P  Physical Exam: General Appearance Well developed, in no distress Eyes  Non icteric  HEENT  Non traumatic, normocephalic  Mouth No lesion, tongue papillated, no cheilosis Neck Supple without adenopathy, thyroid not enlarged, no carotid bruits, no JVD Lungs Clear to auscultation bilaterally COR Normal S1, normal S2, regular rhythm, no murmur, quiet precordium Abdomen hyperactive bowel sounds. Soft mild diffuse tenderness greater in the right lower quadrant. No palpable mass. Well-healed surgical scars Rectal external skin tags consistent with Crohn's disease. Normal rectal sphincter tone. Hemoccult-positive stool Extremities  No pedal edema Skin No lesions Neurological Alert and oriented x 3 Psychological Normal mood and affect  Assessment and Plan:   Problem #691 48 year old white female with Crohn's colitis, ileitis and rectal involvement who is doing well on Remicade 10 mg/kg every 6 weeks and mesalamine 4.8 g daily. She needs a TB skin test today. She needs all labs repeated today  including  blood count, B12, iron studies, sedimentation rate, metabolic panel and TSH. We will refill her medications which include B12injectable,, Analpram cream, mesalamine, and potassium.    Karen Knox 12/25/2013

## 2013-12-25 NOTE — Patient Instructions (Signed)
We have sent the following medications to your pharmacy for you to pick up at your convenience: B12 Analpram Lialda Potassium  We have given you a TB skin test today. Please make certain to come back to the office for a reading between 48-72 hours from now to avoid requiring repeat testing.  Your physician has requested that you go to the basement for the following lab work before leaving today: CBC, B12, IBC, CMET, SED rate, TSH  CC:Dr Pearson Grippe

## 2013-12-26 LAB — COMPREHENSIVE METABOLIC PANEL
ALT: 13 U/L (ref 0–35)
AST: 19 U/L (ref 0–37)
Albumin: 3.5 g/dL (ref 3.5–5.2)
Alkaline Phosphatase: 56 U/L (ref 39–117)
BUN: 10 mg/dL (ref 6–23)
CO2: 26 mEq/L (ref 19–32)
Calcium: 8.9 mg/dL (ref 8.4–10.5)
Chloride: 104 mEq/L (ref 96–112)
Creatinine, Ser: 0.6 mg/dL (ref 0.4–1.2)
GFR: 106.18 mL/min (ref >60.00)
Glucose, Bld: 46 mg/dL — CL (ref 70–99)
Potassium: 4.5 mEq/L (ref 3.5–5.1)
Sodium: 139 mEq/L (ref 135–145)
Total Bilirubin: 1.4 mg/dL — ABNORMAL HIGH (ref 0.2–1.2)
Total Protein: 7 g/dL (ref 6.0–8.3)

## 2013-12-26 LAB — IBC PANEL
Iron: 79 ug/dL (ref 42–145)
Saturation Ratios: 16.5 % — ABNORMAL LOW (ref 20.0–50.0)
Transferrin: 342.8 mg/dL (ref 212.0–360.0)

## 2013-12-26 LAB — VITAMIN B12: Vitamin B-12: 311 pg/mL (ref 211–911)

## 2013-12-26 LAB — TSH: TSH: 1.24 u[IU]/mL (ref 0.35–4.50)

## 2013-12-27 LAB — TB SKIN TEST
Induration: 0 mm
TB Skin Test: NEGATIVE

## 2013-12-28 ENCOUNTER — Telehealth: Payer: Self-pay | Admitting: *Deleted

## 2013-12-28 NOTE — Telephone Encounter (Signed)
Spoke with patient and she is calling back because she would like Dr. Juanda Chance to recommend a doctor for her to see about the low blood sugar. She states her PCP Dr. Selena Batten has not done anything about it in the past. She has had several lab tests with low blood sugar and wants to have it worked up further. Please, advise.

## 2013-12-29 NOTE — Telephone Encounter (Signed)
Dr Urbano Heir with [facility] is an excellent endoscrinologist,I recommend her.

## 2013-12-31 NOTE — Telephone Encounter (Signed)
Left a message for patient to call back. 

## 2013-12-31 NOTE — Telephone Encounter (Signed)
Patient given Dr. Brodie's recommendation. 

## 2014-01-21 ENCOUNTER — Telehealth: Payer: Self-pay | Admitting: *Deleted

## 2014-01-21 MED ORDER — POTASSIUM CHLORIDE CRYS ER 20 MEQ PO TBCR: 20.0000 meq | EXTENDED_RELEASE_TABLET | Freq: Every day | ORAL | Status: DC

## 2014-01-21 NOTE — Telephone Encounter (Signed)
Patient requests refills on Klor Con. Okay to continue filling?

## 2014-01-21 NOTE — Telephone Encounter (Signed)
Rx sent 

## 2014-01-21 NOTE — Telephone Encounter (Signed)
OK to refill Clor Kon.

## 2014-02-07 ENCOUNTER — Other Ambulatory Visit: Payer: Self-pay | Admitting: Obstetrics & Gynecology

## 2014-02-11 LAB — CYTOLOGY - PAP

## 2014-03-13 ENCOUNTER — Other Ambulatory Visit: Payer: Self-pay | Admitting: Internal Medicine

## 2014-04-03 ENCOUNTER — Telehealth: Payer: Self-pay | Admitting: Internal Medicine

## 2014-04-03 NOTE — Telephone Encounter (Signed)
I have sent a discount card for Lialda to patient's home address. I have left a voicemail to advise patient.

## 2014-05-07 ENCOUNTER — Other Ambulatory Visit: Payer: Self-pay | Admitting: Internal Medicine

## 2014-05-16 ENCOUNTER — Other Ambulatory Visit: Payer: Self-pay | Admitting: Internal Medicine

## 2014-05-16 ENCOUNTER — Telehealth: Payer: Self-pay | Admitting: Internal Medicine

## 2014-05-16 NOTE — Telephone Encounter (Signed)
Talked to the patient at 10:55 am on 05/16/14 regarding needing a refill on their B-12 shots. Jesse Fall, RN, said that since the patient's PCP has changed how they are taking their B-12, their PCP needs to put the refill in for them. I explained this to the patient and she stated she understood.

## 2014-06-09 ENCOUNTER — Other Ambulatory Visit: Payer: Self-pay | Admitting: Internal Medicine

## 2014-11-28 ENCOUNTER — Telehealth: Payer: Self-pay | Admitting: *Deleted

## 2014-11-28 NOTE — Telephone Encounter (Signed)
Left a message for patient to call back. (Needs new GI MD) 

## 2014-11-28 NOTE — Telephone Encounter (Signed)
Spoke with patient and scheduled and OV with Dr. Lavon Paganini.

## 2015-01-17 ENCOUNTER — Ambulatory Visit
Admission: RE | Admit: 2015-01-17 | Discharge: 2015-01-17 | Disposition: A | Discharge status: Home / Self Care | Payer: No Typology Code available for payment source | Source: Ambulatory Visit | Attending: Infectious Disease | Admitting: Infectious Disease

## 2015-01-17 ENCOUNTER — Other Ambulatory Visit: Payer: Self-pay | Admitting: Infectious Disease

## 2015-01-17 DIAGNOSIS — R7611 Nonspecific reaction to tuberculin skin test without active tuberculosis: Secondary | ICD-10-CM

## 2015-01-24 ENCOUNTER — Ambulatory Visit (INDEPENDENT_AMBULATORY_CARE_PROVIDER_SITE_OTHER)
Admission: RE | Admit: 2015-01-24 | Discharge: 2015-01-24 | Disposition: A | Discharge status: Home / Self Care | Payer: BLUE CROSS/BLUE SHIELD | Source: Ambulatory Visit | Attending: Gastroenterology | Admitting: Gastroenterology

## 2015-01-24 ENCOUNTER — Other Ambulatory Visit (INDEPENDENT_AMBULATORY_CARE_PROVIDER_SITE_OTHER): Payer: BLUE CROSS/BLUE SHIELD

## 2015-01-24 ENCOUNTER — Encounter: Payer: Self-pay | Admitting: Gastroenterology

## 2015-01-24 ENCOUNTER — Ambulatory Visit (INDEPENDENT_AMBULATORY_CARE_PROVIDER_SITE_OTHER): Payer: BLUE CROSS/BLUE SHIELD | Admitting: Gastroenterology

## 2015-01-24 VITALS — BP 110/60 | HR 66 | Ht 63.0 in | Wt 114.8 lb

## 2015-01-24 DIAGNOSIS — R7612 Nonspecific reaction to cell mediated immunity measurement of gamma interferon antigen response without active tuberculosis: Secondary | ICD-10-CM | POA: Diagnosis not present

## 2015-01-24 DIAGNOSIS — D649 Anemia, unspecified: Secondary | ICD-10-CM | POA: Diagnosis not present

## 2015-01-24 DIAGNOSIS — K5090 Crohn's disease, unspecified, without complications: Secondary | ICD-10-CM | POA: Diagnosis not present

## 2015-01-24 LAB — CBC WITH DIFFERENTIAL/PLATELET
Basophils Absolute: 0 10*3/uL (ref 0.0–0.1)
Basophils Relative: 0.2 % (ref 0.0–3.0)
Eosinophils Absolute: 0.1 10*3/uL (ref 0.0–0.7)
Eosinophils Relative: 1.2 % (ref 0.0–5.0)
HCT: 40.2 % (ref 36.0–46.0)
Hemoglobin: 13.2 g/dL (ref 12.0–15.0)
Lymphocytes Relative: 34.2 % (ref 12.0–46.0)
Lymphs Abs: 3.5 10*3/uL (ref 0.7–4.0)
MCHC: 32.8 g/dL (ref 30.0–36.0)
MCV: 88.5 fl (ref 78.0–100.0)
Monocytes Absolute: 1 10*3/uL (ref 0.1–1.0)
Monocytes Relative: 10.1 % (ref 3.0–12.0)
Neutro Abs: 5.5 10*3/uL (ref 1.4–7.7)
Neutrophils Relative %: 54.3 % (ref 43.0–77.0)
Platelets: 341 10*3/uL (ref 150.0–400.0)
RBC: 4.54 Mil/uL (ref 3.87–5.11)
RDW: 13.8 % (ref 11.5–15.5)
WBC: 10.1 10*3/uL (ref 4.0–10.5)

## 2015-01-24 LAB — BASIC METABOLIC PANEL
BUN: 9 mg/dL (ref 6–23)
CO2: 29 mEq/L (ref 19–32)
Calcium: 8.7 mg/dL (ref 8.4–10.5)
Chloride: 104 mEq/L (ref 96–112)
Creatinine, Ser: 0.64 mg/dL (ref 0.40–1.20)
GFR: 105.68 mL/min (ref >60.00)
Glucose, Bld: 77 mg/dL (ref 70–99)
Potassium: 3.5 mEq/L (ref 3.5–5.1)
Sodium: 138 mEq/L (ref 135–145)

## 2015-01-24 LAB — HEPATIC FUNCTION PANEL
ALT: 19 U/L (ref 0–35)
AST: 15 U/L (ref 0–37)
Albumin: 3.5 g/dL (ref 3.5–5.2)
Alkaline Phosphatase: 57 U/L (ref 39–117)
Bilirubin, Direct: 0.2 mg/dL (ref 0.0–0.3)
Total Bilirubin: 1.2 mg/dL (ref 0.2–1.2)
Total Protein: 6.6 g/dL (ref 6.0–8.3)

## 2015-01-24 LAB — VITAMIN B12: Vitamin B-12: 346 pg/mL (ref 211–911)

## 2015-01-24 LAB — HIGH SENSITIVITY CRP: CRP, High Sensitivity: 1.34 mg/L (ref 0.000–5.000)

## 2015-01-24 LAB — SEDIMENTATION RATE: Sed Rate: 16 mm/hr (ref 0–22)

## 2015-01-24 LAB — FOLATE: Folate: 14.4 ng/mL (ref >5.9)

## 2015-01-24 LAB — FERRITIN: Ferritin: 3.7 ng/mL — ABNORMAL LOW (ref 10.0–291.0)

## 2015-01-24 NOTE — Patient Instructions (Signed)
Go to the basement for labs and chest xray We will refer you to pulmonary and contact you with that appointment We will contact [facility] for your records

## 2015-01-24 NOTE — Progress Notes (Signed)
Karen Knox    [id number]    1965-08-05  Primary Care Physician:KIM, Fayrene Fearing, MD  Referring Physician: Pearson Grippe, MD [address] [place], [place] [address]  Chief complaint:  Crohn's disease  HPI:  49 year old white female status post terminal ileal resection in 1990 and subsequent distal ileal resection in 2001 for anastomotic stricture currently in remission on Remicade 5 mg per kilogram every 6 weeks administered by Dr. Tawana Scale office . She had positive quantiferon TB gold and recently sero converted. Denies any night sweat or fevers. No weight loss. No cough or sputum production. No h/o exposure to TB. Per patient she was referred to ID by Dr Shawnee Knapp office to latent TB and she was sent to [facility] clinic for evaluation instead. She had a CXR there for report not available yet.   Outpatient Encounter Prescriptions as of 01/24/2015  Medication Sig  . cyanocobalamin (,VITAMIN B-12,) 1000 MCG/ML injection Inject 1 mL (1,000 mcg total) into the muscle every 30 (thirty) days.  . hydrocortisone-pramoxine (ANALPRAM-HC) 2.5-1 % rectal cream Place rectally 3 (three) times daily.  Marland Kitchen inFLIXimab (REMICADE) 100 MG injection Inject 500 mg into the vein every 6 (six) weeks.  Marland Kitchen LIALDA 1.2 G EC tablet TAKE 4 TABLETS BY MOUTH EVERY DAY  . potassium chloride SA (K-DUR,KLOR-CON) 20 MEQ tablet Take 1 tablet (20 mEq total) by mouth daily.  . [DISCONTINUED] LIALDA 1.2 G EC tablet TAKE 4 TABLETS BY MOUTH EVERY DAY   Facility-Administered Encounter Medications as of 01/24/2015  Medication  . 0.9 %  sodium chloride infusion  . tuberculin injection 5 Units    Allergies as of 01/24/2015  . (No Known Allergies)    Past Medical History  Diagnosis Date  . Anal fissure   . Crohn's disease (HCC)   . Arthritis     ? IBD related?  . Small bowel stricture (HCC)     multiple 07/05/00  . Gilbert's syndrome   . Iron deficiency anemia   . B12 deficiency   . External  hemorrhoids   . Internal hemorrhoids   . TB (tuberculosis)     Past Surgical History  Procedure Laterality Date  . Exploratory laparotomy  2002    with extensive lysis of adhesions, resection of ileocolonic anastomosis,resection segment small bowel; anastomosis of small bowel  to small bowel; ileocolic anastomosis  . Appendectomy      with initial colon resection including terminal ileum  . Colon resection  1993  . Cholecystectomy      Family History  Problem Relation Age of Onset  . Arthritis Father     and on mothers side  . Multiple sclerosis Father   . Colon cancer Father 26  . Melanoma Mother     Social History   Social History  . Marital Status: Legally Separated    Spouse Name: N/A  . Number of Children: 2  . Years of Education: N/A   Occupational History  . [employer]     Social History Main Topics  . Smoking status: Never Smoker   . Smokeless tobacco: Never Used  . Alcohol Use: Yes     Comment: occasional  . Drug Use: No  . Sexual Activity: Not on file   Other Topics Concern  . Not on file   Social History Narrative      Review of systems: Review of Systems  Constitutional: Negative for fever and chills.  HENT: Negative.  Eyes: Negative for blurred vision.  Respiratory: Negative for cough, shortness of breath and wheezing.   Cardiovascular: Negative for chest pain and palpitations.  Gastrointestinal: as per HPI Genitourinary: Negative for dysuria, urgency, frequency and hematuria.  Musculoskeletal: Negative for myalgias, back pain and joint pain.  Skin: Negative for itching and rash.  Neurological: Negative for dizziness, tremors, focal weakness, seizures and loss of consciousness.  Endo/Heme/Allergies: Negative for environmental allergies.  Psychiatric/Behavioral: Negative for depression, suicidal ideas and hallucinations.  All other systems reviewed and are negative.   Physical Exam: Filed Vitals:   01/24/15 1408  BP: 110/60    Pulse: 66   Gen:      No acute distress HEENT:  EOMI, sclera anicteric Neck:     No masses; no thyromegaly Lungs:    Clear to auscultation bilaterally; normal respiratory effort CV:         Regular rate and rhythm; no murmurs Abd:      + bowel sounds; soft, non-tender; no palpable masses, no distension Ext:    No edema; adequate peripheral perfusion Skin:      Warm and dry; no rash Neuro: alert and oriented x 3 Psych: normal mood and affect  Data Reviewed: Colonoscopy 2012: Distal ileal stricture Evidence of colitis near ileo colonic anastamosis   Assessment and Plan/Recommendations:  49 year old female wit history of Crohn's disease currently on Remicade 5 mg/kgevery 6 weeks with positive quantiferon TB. Patient has no symptoms of active pulmonary TB Obtain CXR and Referral to pulmonary for latent TB Due for next dose of Remicade on Nov 16, we may need to hold Remicade. Will discuss her case with IBD specialist  [name] , MD [phone] Mon-Fri 8a-5p [phone] after 5p, weekends, holidays

## 2015-01-27 LAB — QUANTIFERON TB GOLD ASSAY (BLOOD)
Interferon Gamma Release Assay: POSITIVE — AB
Mitogen value: >10 IU/mL
Quantiferon Nil Value: 2.26 IU/mL
Quantiferon Tb Ag Minus Nil Value: 0.64 IU/mL
TB Ag value: 2.9 IU/mL

## 2015-01-28 ENCOUNTER — Telehealth: Payer: Self-pay | Admitting: Gastroenterology

## 2015-01-29 DIAGNOSIS — K5010 Crohn's disease of large intestine without complications: Secondary | ICD-10-CM | POA: Insufficient documentation

## 2015-01-29 NOTE — Telephone Encounter (Signed)
Please advise on her recent labs

## 2015-01-29 NOTE — Telephone Encounter (Signed)
Patient calling back regarding this. Best # [phone]

## 2015-01-29 NOTE — Telephone Encounter (Signed)
The patient had her Rheumatologist fax the results from the Quantiferon Gold test done by his office.

## 2015-01-31 ENCOUNTER — Other Ambulatory Visit: Payer: Self-pay | Admitting: Internal Medicine

## 2015-01-31 DIAGNOSIS — Z227 Latent tuberculosis: Secondary | ICD-10-CM

## 2015-01-31 MED ORDER — ISONIAZID 100 MG PO TABS: 200.0000 mg | ORAL_TABLET | ORAL | Status: DC

## 2015-01-31 MED ORDER — RIFAPENTINE 150 MG PO TABS: 900.0000 mg | ORAL_TABLET | ORAL | Status: DC

## 2015-01-31 MED ORDER — VITAMIN B-6 100 MG PO TABS: 100.0000 mg | ORAL_TABLET | Freq: Every day | ORAL | Status: DC

## 2015-01-31 MED ORDER — ISONIAZID 300 MG PO TABS: 600.0000 mg | ORAL_TABLET | ORAL | Status: DC

## 2015-01-31 MED ORDER — ONDANSETRON 8 MG PO TBDP: 8.0000 mg | ORAL_TABLET | Freq: Three times a day (TID) | ORAL | Status: DC | PRN

## 2015-01-31 NOTE — Progress Notes (Unsigned)
Karen Knox is a 49yo F with crohn's disease s/p colon resection, who is on every 6 wk remicade infusion. Her last evaluation for LTBI showed + quantiferon. She has clean cxr. No hx of TB exposure. No cough, weight loss, nightsweats .  Discussed risk of reactivation due to anti-TNF treatment, LTBI treatment options.  For now: we will try modified DOT of rifapentin-INH once a week x 12 weeks. To start on 01/31/2015  She weighs 115 lb, 52.3 kg  Will prescribe: zofran 8mg  ODT x 20 tabs to use prn Inh 800 mg rifapentin 900 mg Vit b 6 daily x 12 wk

## 2015-02-03 ENCOUNTER — Telehealth: Payer: Self-pay

## 2015-02-03 NOTE — Telephone Encounter (Signed)
Patient has moved her appointment to 02/19/15. She will call is she acutely worsens before this.

## 2015-02-03 NOTE — Telephone Encounter (Signed)
-----   Message from Napoleon Form, MD sent at 01/31/2015 11:34 AM EST ----- Regarding: RE: + quantiferon on remicade Thank you, we will try to reschedule her remicade infusion a week or if no significant worsening of symptoms for 2 weeks later.  Tyrone Nine ----- Message -----    From: Judyann Munson, MD    Sent: 01/31/2015  10:51 AM      To: Iva Boop, MD, Marlowe Kays, CMA, # Subject: RE: + quantiferon on remicade                  Bufford Spikes,  I spoke to [facility] regarding LTBI treatment. In an ideal scenario, I would like her to get 2 months of LTBI before her infusion. Given that she gets symptomatic when she gets out of a 6 wk remicade schedule, lets plan to push out this coming infusion 02/03/15 -> til the following week, possibly the 28th if she has relatively little symptoms.  In the meantime, I will start her on 12 weekly doses of rifapentin-INH which is the shortest LTBI course, but often the most intolerable SE.  She is to call me mid to late week to see how her GI symptoms are doing to decide if can push out a 2nd week.  Thanks. Call if questions [phone].  Aram Beecham ----- Message -----    From: Napoleon Form, MD    Sent: 01/30/2015   9:42 AM      To: Iva Boop, MD, Marlowe Kays, CMA, # Subject: RE: + quantiferon on remicade                  Thank you Dr Drue Second for willing to see her soon. She is due for her Remicade infusion on 02/03/15, should we hold or go ahead with it. She has not done well in the past when she skipped Remicade or it was delayed Scherry Ran  ----- Message -----    From: Iva Boop, MD    Sent: 01/29/2015   4:59 PM      To: Marlowe Kays, CMA, Judyann Munson, MD, # Subject: + quantiferon on remicade                      This is the lady that has the latent TB that I texted you about Aram Beecham.  I am ccing Veena and her CMA Zella Ball about this - thank you for being willing to see her.  Baldo Ash

## 2015-02-06 ENCOUNTER — Encounter: Payer: Self-pay | Admitting: Internal Medicine

## 2015-02-06 ENCOUNTER — Ambulatory Visit (INDEPENDENT_AMBULATORY_CARE_PROVIDER_SITE_OTHER): Payer: BLUE CROSS/BLUE SHIELD | Admitting: Internal Medicine

## 2015-02-06 VITALS — BP 121/81 | HR 64 | Temp 98.8°F | Wt 112.0 lb

## 2015-02-06 DIAGNOSIS — R7611 Nonspecific reaction to tuberculin skin test without active tuberculosis: Secondary | ICD-10-CM

## 2015-02-06 DIAGNOSIS — R110 Nausea: Secondary | ICD-10-CM

## 2015-02-06 DIAGNOSIS — Z227 Latent tuberculosis: Secondary | ICD-10-CM

## 2015-02-06 DIAGNOSIS — K50119 Crohn's disease of large intestine with unspecified complications: Secondary | ICD-10-CM | POA: Diagnosis not present

## 2015-02-06 NOTE — Progress Notes (Signed)
RFV: referral for latent TB Subjective:    Patient ID: Karen Knox, female    DOB: 10/10/1965, 49 y.o.   MRN: [id number]  HPI 49 yo F with crohns colitis, which she was diagnosed at the age of 65, she has had 2 surgeries with small and large bowel resection. She has been on remicaide for greater than 6 years. She usually gets her infusions every 6 weeks since she become symptomatic between 7-8 wks time frame. In her annual visits and screening for LTBI, she was positive by quantiferon this year whereas it had been negative in the years past. She did have a skin test in the last 21 months which was negative. We discussed various treatment options for LTBI and decided upon doing the 12 -weekly dosing regimen with INH plus rifapentin plus vit b6. She took her first dose of 12 dose of INH and rifapentin on 11/14. She premedicated with zofran which helped. She has noticed more frequent diarrhea, nad worsening headache that she is attributing to her menstrual cycle.  She is worried to delaying her remicade infusion too long since she would have significant symptoms. She is currently scheduled to have remicade infusion on 11/30  Tb risk =  No travel outside of [place]. 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   No Known Allergies Current Outpatient Prescriptions on File Prior to Visit  Medication Sig Dispense Refill  . cyanocobalamin (,VITAMIN B-12,) 1000 MCG/ML injection Inject 1 mL (1,000 mcg total) into the muscle every 30 (thirty) days. 10 mL 0  . hydrocortisone-pramoxine (ANALPRAM-HC) 2.5-1 % rectal cream Place rectally 3 (three) times daily. 60 g 2  . inFLIXimab (REMICADE) 100 MG injection Inject 500 mg into the vein every 6 (six) weeks. 1 each 3  . isoniazid (NYDRAZID) 100 MG tablet Take 2 tablets (200 mg total) by mouth once a week. In addn to 2 tab of  isoniazid for a total of  24  tablet 0  . isoniazid (NYDRAZID) 300 MG tablet Take 2 tablets (600 mg total) by mouth once a week. In addition to 2 tabsof  isoniazid for a total of  24 tablet 0  . LIALDA 1.2 G EC tablet TAKE 4 TABLETS BY MOUTH EVERY DAY 120 tablet 2  . ondansetron (ZOFRAN ODT) 8 MG disintegrating tablet Take 1 tablet (8 mg total) by mouth every 8 (eight) hours as needed for nausea or vomiting. 20 tablet 1  . potassium chloride SA (K-DUR,KLOR-CON) 20 MEQ tablet Take 1 tablet (20 mEq total) by mouth daily. 30 tablet 1  . pyridOXINE (VITAMIN B-6) 100 MG tablet Take 1 tablet (100 mg total) by mouth daily. 90 tablet 0  . Rifapentine 150 MG TABS Take 6 tablets (900 mg total) by mouth once a week. 72 each 0   Current Facility-Administered Medications on File Prior to Visit  Medication Dose Route Frequency Provider Last Rate Last Dose  . 0.9 %  sodium chloride infusion  500 mL Intravenous Continuous Hart Carwin, MD      . tuberculin injection 5 Units  5 Units Intradermal Once Hart Carwin, MD       Active Ambulatory Problems    Diagnosis Date Noted  . Crohn's colitis (HCC) 01/29/2015   Resolved Ambulatory Problems    Diagnosis Date Noted  . No Resolved Ambulatory Problems   Past Medical History  Diagnosis Date  . Anal  fissure   . Crohn's disease (HCC)   . Arthritis   . Small bowel stricture (HCC)   . Gilbert's syndrome   . Iron deficiency anemia   . B12 deficiency   . External hemorrhoids   . Internal hemorrhoids   . TB (tuberculosis)    - 2 surgeries, small and large bowel resection  Social History  Substance Use Topics  . Smoking status: Never Smoker   . Smokeless tobacco: Never Used  . Alcohol Use: Yes     Comment: occasional   family history includes Arthritis in her father; Colon cancer (age of onset: 22) in her father; Melanoma in her mother; Multiple sclerosis in her father.  Review of Systems + headache, nausea. Frequent loose bowel. Usually 3 times- usually, but recently  5-6 times (more frequent, less volume), no abdominal cramping, no blood in stool  Otherwise 10 point ros is negative    Objective:   Physical Exam  BP 121/81 mmHg  Pulse 64  Temp(Src) 98.8 F (37.1 C) (Oral)  Wt 112 lb (50.803 kg)  LMP 01/09/2015 Physical Exam  Constitutional:  oriented to person, place, and time. appears well-developed and well-nourished. No distress.  HENT: [place]/AT, PERRLA, no scleral icterus Mouth/Throat: Oropharynx is clear and moist. No oropharyngeal exudate.  Cardiovascular: Normal rate, regular rhythm and normal heart sounds. Exam reveals no gallop and no friction rub.  No murmur heard.  Pulmonary/Chest: Effort normal and breath sounds normal. No respiratory distress.  has no wheezes.  Neck = supple, no nuchal rigidity Abdominal: Soft. Bowel sounds are normal.  exhibits no distension. mils tenderness in RLQ Lymphadenopathy: no cervical adenopathy. No axillary adenopathy Neurological: alert and oriented to person, place, and time.  Skin: Skin is warm and dry. No rash noted. No erythema.  Psychiatric: a normal mood and affect.  behavior is normal.      Assessment & Plan:  Latent TB = will do a 12 wkly dose regimen of rifapentin plus inh, to take daily vit b6 during this period. She will be taking the meds every Monday night. She has tolerated her first of 12 doses. Ideally would like to have her 4 wk on LTBI before getting humira, will have to weigh her GI symptoms to see if she could tolerate any further postponement. Patient will continue to text after each dose for updates  For now, she will have had 3 doses prior to her humira infusion.  Will get monthly cmp while on ltbi treatment  Nausea = continue to premedicate with zofran and use as needed  Crohn's = plan for next infusion on 11/30

## 2015-02-24 ENCOUNTER — Telehealth: Payer: Self-pay | Admitting: *Deleted

## 2015-02-24 MED ORDER — MESALAMINE 1.2 G PO TBEC: 4.8000 g | DELAYED_RELEASE_TABLET | Freq: Every day | ORAL | Status: DC

## 2015-02-24 NOTE — Telephone Encounter (Signed)
Medication Lialda sent per fax request from pharmacy

## 2015-03-03 ENCOUNTER — Other Ambulatory Visit: Payer: Self-pay | Admitting: Obstetrics & Gynecology

## 2015-03-03 DIAGNOSIS — N6310 Unspecified lump in the right breast, unspecified quadrant: Secondary | ICD-10-CM

## 2015-03-07 ENCOUNTER — Other Ambulatory Visit: Payer: BLUE CROSS/BLUE SHIELD

## 2015-03-18 ENCOUNTER — Other Ambulatory Visit: Payer: Self-pay | Admitting: Internal Medicine

## 2015-04-16 ENCOUNTER — Telehealth: Payer: Self-pay | Admitting: *Deleted

## 2015-04-16 NOTE — Telephone Encounter (Signed)
Faxed office note from 11/17 to [facility] at [phone] per their request. Andree Coss, RN

## 2015-05-11 ENCOUNTER — Other Ambulatory Visit: Payer: Self-pay | Admitting: Internal Medicine

## 2015-11-10 ENCOUNTER — Encounter: Payer: Self-pay | Admitting: Gastroenterology

## 2016-01-05 ENCOUNTER — Encounter: Payer: Self-pay | Admitting: Gastroenterology

## 2016-01-13 ENCOUNTER — Other Ambulatory Visit: Payer: Self-pay | Admitting: *Deleted

## 2016-01-13 MED ORDER — HYDROCORTISONE ACE-PRAMOXINE 2.5-1 % RE CREA: TOPICAL_CREAM | Freq: Three times a day (TID) | RECTAL | 2 refills | Status: DC

## 2016-01-13 NOTE — Telephone Encounter (Signed)
ANAL PRAM SENT PER FAX REQUEST FROM PHARMACY

## 2016-01-16 ENCOUNTER — Other Ambulatory Visit: Payer: Self-pay | Admitting: *Deleted

## 2016-01-16 NOTE — Telephone Encounter (Signed)
Fax request for Analpram but was already refilled on 01/13/2016

## 2016-02-07 ENCOUNTER — Other Ambulatory Visit: Payer: Self-pay | Admitting: Gastroenterology

## 2016-03-10 ENCOUNTER — Other Ambulatory Visit: Payer: Self-pay | Admitting: Obstetrics & Gynecology

## 2016-03-10 ENCOUNTER — Other Ambulatory Visit: Payer: Self-pay | Admitting: Nurse Practitioner

## 2016-03-10 DIAGNOSIS — N6310 Unspecified lump in the right breast, unspecified quadrant: Secondary | ICD-10-CM

## 2016-03-18 ENCOUNTER — Ambulatory Visit
Admission: RE | Admit: 2016-03-18 | Discharge: 2016-03-18 | Disposition: A | Discharge status: Home / Self Care | Payer: BLUE CROSS/BLUE SHIELD | Source: Ambulatory Visit | Attending: Nurse Practitioner | Admitting: Nurse Practitioner

## 2016-03-18 DIAGNOSIS — N6310 Unspecified lump in the right breast, unspecified quadrant: Secondary | ICD-10-CM

## 2016-08-31 ENCOUNTER — Other Ambulatory Visit (INDEPENDENT_AMBULATORY_CARE_PROVIDER_SITE_OTHER): Payer: BLUE CROSS/BLUE SHIELD

## 2016-08-31 ENCOUNTER — Encounter: Payer: Self-pay | Admitting: Gastroenterology

## 2016-08-31 ENCOUNTER — Ambulatory Visit (INDEPENDENT_AMBULATORY_CARE_PROVIDER_SITE_OTHER): Payer: BLUE CROSS/BLUE SHIELD | Admitting: Gastroenterology

## 2016-08-31 VITALS — BP 138/90 | HR 60 | Ht 63.0 in | Wt 112.0 lb

## 2016-08-31 DIAGNOSIS — K50018 Crohn's disease of small intestine with other complication: Secondary | ICD-10-CM

## 2016-08-31 DIAGNOSIS — K5000 Crohn's disease of small intestine without complications: Secondary | ICD-10-CM

## 2016-08-31 LAB — VITAMIN B12: Vitamin B-12: 573 pg/mL (ref 211–911)

## 2016-08-31 LAB — FERRITIN: Ferritin: 5.1 ng/mL — ABNORMAL LOW (ref 10.0–291.0)

## 2016-08-31 LAB — HIGH SENSITIVITY CRP: CRP, High Sensitivity: 2.72 mg/L (ref 0.000–5.000)

## 2016-08-31 LAB — FOLATE: Folate: 16.4 ng/mL (ref >5.9)

## 2016-08-31 MED ORDER — NA SULFATE-K SULFATE-MG SULF 17.5-3.13-1.6 GM/177ML PO SOLN: ORAL | 0 refills | Status: DC

## 2016-08-31 NOTE — Patient Instructions (Addendum)
Go to the basement for labs today  Follow up with your primary care provider for blood pressure check in 1-2 weeks   You have been scheduled for a colonoscopy. Please follow written instructions given to you at your visit today.  Please pick up your prep supplies at the pharmacy within the next 1-3 days. If you use inhalers (even only as needed), please bring them with you on the day of your procedure. Your physician has requested that you go to www.startemmi.com and enter the access code given to you at your visit today. This web site gives a general overview about your procedure. However, you should still follow specific instructions given to you by our office regarding your preparation for the procedure.  Follow up in 6 months

## 2016-08-31 NOTE — Progress Notes (Addendum)
Karen Knox    [id number]    11/11/1965  Primary Care Physician:Kim, Fayrene Fearing, MD  Referring Physician: Donnetta Hail, MD [address] [place], [place] [address]  Chief complaint:  Crohn's disease  HPI: 51 year old female with history of Crohn's disease involving small bowel with stricture status post terminal ileal resection in 1990 and resection of neoterminal ileum in 2001 for an anastomotic stricture. Patient is on anti-TNF. She had positive TB quantiferon in November 2016, was negative prior to that. Chest x-ray was negative and no symptoms of active TB . She was treated for latent TB by Dr. Ilsa Iha. She restarted anti-TNF after completion of treatment for latent TB. Patient is doing well. She continues to have intermittent episodes of lower abdominal discomfort associated with constipation and bloating infrequently pelvic once every 2 months, lasts about 24-48 hours. She usually changes her diet to liquid and it resolves within a day or 2. She notices it when she eats high-fiber diet, pork, granola or salads.   Colonoscopy November 2012: Multiple aphthous ulcers in the distal neoterminal ileum and colonic anastomosis associated with 5 cm stenosis in the neoterminal ileum.  Pathology showed evidence of chronic active ileitis and anastomotic involvement with Crohn's disease  Outpatient Encounter Prescriptions as of 08/31/2016  Medication Sig  . cyanocobalamin (,VITAMIN B-12,) 1000 MCG/ML injection Inject 1 mL (1,000 mcg total) into the muscle every 30 (thirty) days.  . inFLIXimab (REMICADE) 100 MG injection Inject 500 mg into the vein every 6 (six) weeks.  . mesalamine (LIALDA) 1.2 g EC tablet TAKE 4 TABLETS EVERY DAY  . potassium chloride SA (K-DUR,KLOR-CON) 20 MEQ tablet Take 20 mEq by mouth daily.  . ondansetron (ZOFRAN-ODT) 8 MG disintegrating tablet TAKE 1 TABLET (8 MG TOTAL) BY MOUTH EVERY 8 (EIGHT) HOURS AS NEEDED FOR NAUSEA OR VOMITING. (Patient  not taking: Reported on 08/31/2016)  . [DISCONTINUED] hydrocortisone-pramoxine (ANALPRAM-HC) 2.5-1 % rectal cream Place rectally 3 (three) times daily.  . [DISCONTINUED] isoniazid (NYDRAZID) 100 MG tablet Take 2 tablets (200 mg total) by mouth once a week. In addn to 2 tab of 300mg  isoniazid for a total of 800mg   . [DISCONTINUED] isoniazid (NYDRAZID) 300 MG tablet Take 2 tablets (600 mg total) by mouth once a week. In addition to 2 tabsof 100mg  isoniazid for a total of 800mg   . [DISCONTINUED] potassium chloride SA (K-DUR,KLOR-CON) 20 MEQ tablet Take 1 tablet (20 mEq total) by mouth daily.  . [DISCONTINUED] pyridOXINE (VITAMIN B-6) 100 MG tablet Take 1 tablet (100 mg total) by mouth daily.  . [DISCONTINUED] Rifapentine 150 MG TABS Take 6 tablets (900 mg total) by mouth once a week.   Facility-Administered Encounter Medications as of 08/31/2016  Medication  . 0.9 %  sodium chloride infusion  . tuberculin injection 5 Units    Allergies as of 08/31/2016  . (No Known Allergies)    Past Medical History:  Diagnosis Date  . Anal fissure   . Arthritis    ? IBD related?  . B12 deficiency   . Crohn's disease (HCC)   . External hemorrhoids   . Gilbert's syndrome   . Internal hemorrhoids   . Iron deficiency anemia   . Small bowel stricture (HCC)    multiple 07/05/00  . TB (tuberculosis)     Past Surgical History:  Procedure Laterality Date  . APPENDECTOMY     with initial colon resection including terminal ileum  . CHOLECYSTECTOMY    .  COLON RESECTION  1993  . EXPLORATORY LAPAROTOMY  2002   with extensive lysis of adhesions, resection of ileocolonic anastomosis,resection segment small bowel; anastomosis of small bowel  to small bowel; ileocolic anastomosis    Family History  Problem Relation Age of Onset  . Arthritis Father        and on mothers side  . Multiple sclerosis Father   . Colon cancer Father 47  . Melanoma Mother     Social History   Social History  . Marital  status: Legally Separated    Spouse name: N/A  . Number of children: 2  . Years of education: N/A   Occupational History  . [employer]     Social History Main Topics  . Smoking status: Never Smoker  . Smokeless tobacco: Never Used  . Alcohol use Yes     Comment: occasional  . Drug use: No  . Sexual activity: Not on file   Other Topics Concern  . Not on file   Social History Narrative  . No narrative on file      Review of systems: Review of Systems  Constitutional: Negative for fever and chills.  HENT: Negative.   Eyes: Negative for blurred vision.  Respiratory: Negative for cough, shortness of breath and wheezing.   Cardiovascular: Negative for chest pain and palpitations.  Gastrointestinal: as per HPI Genitourinary: Negative for dysuria, urgency, frequency and hematuria.  Musculoskeletal: Negative for myalgias, back pain and joint pain.  Skin: Negative for itching and rash.  Neurological: Negative for dizziness, tremors, focal weakness, seizures and loss of consciousness.  Endo/Heme/Allergies: Negative for seasonal allergies.  Psychiatric/Behavioral: Negative for depression, suicidal ideas and hallucinations.  All other systems reviewed and are negative.   Physical Exam: Vitals:   08/31/16 0936  BP: 138/90  Pulse: 60   Body mass index is 19.84 kg/m. Gen:      No acute distress HEENT:  EOMI, sclera anicteric Neck:     No masses; no thyromegaly Lungs:    Clear to auscultation bilaterally; normal respiratory effort CV:         Regular rate and rhythm; no murmurs Abd:      + bowel sounds; soft, non-tender; no palpable masses, no distension Ext:    No edema; adequate peripheral perfusion Skin:      Warm and dry; no rash Neuro: alert and oriented x 3 Psych: normal mood and affect  Data Reviewed:  Reviewed labs, radiology imaging, old records and pertinent past GI work up   Assessment and Plan/Recommendations:  51 year old female with history of  Crohn's disease predominantly involving terminal ileum with stricturing disease status post resection of terminal ileum in 1990 and 2001 with evidence of stenosis in the neoterminal ileum and active disease at site of ileocolonic anastomosis She is on infliximab infusion 10 mg/kg every 6 weeks Status post therapy for latent TB  We'll check TB QuantiFERON, CRP, ferritin, B12 and folate level She had CBC and CMP done last week through Dr. Shawnee Knapp office, we'll request the labs  Due for colonoscopy for surveillance, we'll schedule it The risks and benefits as well as alternatives of endoscopic procedure(s) have been discussed and reviewed. All questions answered. The patient agrees to proceed.  Blood pressure elevated at 138/90, she had high blood pressure prior to Remicade infusion and last week at doctor's visit too, thinks it's related to stress Advised patient to recheck blood pressure at home and if it continues to be persistently elevated, will need follow  up with PMD  Return in 6 months or sooner if needed  K. Scherry Ran , MD [phone] Mon-Fri 8a-5p [phone] after 5p, weekends, holidays  CC: Donnetta Hail, MD  Addendum Reviewed labs Total bilirubin elevated at 1.9 do not have direct or indirect bilirubin fraction Alkaline phosphatase 70, AST 13, ALT 17, albumin 3.7 Creatinine 0.6 WBC 6.6 Hemoglobin 12.7 and hematocrit 40.8 with low MCHC 31.1

## 2016-09-02 LAB — QUANTIFERON TB GOLD ASSAY (BLOOD)
Interferon Gamma Release Assay: NEGATIVE
Mitogen-Nil: 3.38 IU/mL
Quantiferon Nil Value: 5.38 IU/mL
Quantiferon Tb Ag Minus Nil Value: <0 IU/mL

## 2016-09-09 ENCOUNTER — Encounter: Payer: Self-pay | Admitting: Gastroenterology

## 2016-09-23 ENCOUNTER — Ambulatory Visit (AMBULATORY_SURGERY_CENTER): Payer: BLUE CROSS/BLUE SHIELD | Admitting: Gastroenterology

## 2016-09-23 ENCOUNTER — Encounter: Payer: Self-pay | Admitting: Gastroenterology

## 2016-09-23 VITALS — BP 125/71 | HR 53 | Temp 97.1°F | Resp 10 | Ht 63.0 in | Wt 112.0 lb

## 2016-09-23 DIAGNOSIS — K5000 Crohn's disease of small intestine without complications: Secondary | ICD-10-CM | POA: Diagnosis present

## 2016-09-23 MED ORDER — SODIUM CHLORIDE 0.9 % IV SOLN: 500.0000 mL | INTRAVENOUS | Status: DC

## 2016-09-23 NOTE — Progress Notes (Signed)
Report to PACU, RN, vss, BBS= Clear.  

## 2016-09-23 NOTE — Progress Notes (Signed)
Called to room to assist during endoscopic procedure.  Patient ID and intended procedure confirmed with present staff. Received instructions for my participation in the procedure from the performing physician.  

## 2016-09-23 NOTE — Patient Instructions (Signed)
YOU HAD AN ENDOSCOPIC PROCEDURE TODAY AT THE [facility]:   Refer to the procedure report that was given to you for any specific questions about what was found during the examination.  If the procedure report does not answer your questions, please call your gastroenterologist to clarify.  If you requested that your care partner not be given the details of your procedure findings, then the procedure report has been included in a sealed envelope for you to review at your convenience later.  YOU SHOULD EXPECT: Some feelings of bloating in the abdomen. Passage of more gas than usual.  Walking can help get rid of the air that was put into your GI tract during the procedure and reduce the bloating. If you had a lower endoscopy (such as a colonoscopy or flexible sigmoidoscopy) you may notice spotting of blood in your stool or on the toilet paper. If you underwent a bowel prep for your procedure, you may not have a normal bowel movement for a few days.  Please Note:  You might notice some irritation and congestion in your nose or some drainage.  This is from the oxygen used during your procedure.  There is no need for concern and it should clear up in a day or so.  SYMPTOMS TO REPORT IMMEDIATELY:   Following lower endoscopy (colonoscopy or flexible sigmoidoscopy):  Excessive amounts of blood in the stool  Significant tenderness or worsening of abdominal pains  Swelling of the abdomen that is new, acute  Fever of 100F or higher    For urgent or emergent issues, a gastroenterologist can be reached at any hour by calling [phone].   DIET:  We do recommend a small meal at first, but then you may proceed to your regular diet.  Drink plenty of fluids but you should avoid alcoholic beverages for 24 hours.  ACTIVITY:  You should plan to take it easy for the rest of today and you should NOT DRIVE or use heavy machinery until tomorrow (because of the sedation medicines used during the test).     FOLLOW UP: Our staff will call the number listed on your records the next business day following your procedure to check on you and address any questions or concerns that you may have regarding the information given to you following your procedure. If we do not reach you, we will leave a message.  However, if you are feeling well and you are not experiencing any problems, there is no need to return our call.  We will assume that you have returned to your regular daily activities without incident.  If any biopsies were taken you will be contacted by phone or by letter within the next 1-3 weeks.  Please call us at [phone] if you have not heard about the biopsies in 3 weeks.    SIGNATURES/CONFIDENTIALITY: You and/or your care partner have signed paperwork which will be entered into your electronic medical record.  These signatures attest to the fact that that the information above on your After Visit Summary has been reviewed and is understood.  Full responsibility of the confidentiality of this discharge information lies with you and/or your care-partner.   Resume medication.Return to see doctor in 2 months.

## 2016-09-23 NOTE — Op Note (Signed)
[facility] Patient Name: Karen Knox Procedure Date: 09/23/2016 8:25 AM MRN: [id number] Endoscopist: Napoleon Form , MD Age: 51 Referring MD:  Date of Birth: 01-04-1966 Gender: Female Account #: [id number] Procedure:                Colonoscopy Indications:              High risk colon cancer surveillance: Crohn's                            disease small intestine Medicines:                Monitored Anesthesia Care Procedure:                Pre-Anesthesia Assessment:                           - Prior to the procedure, a History and Physical                            was performed, and patient medications and                            allergies were reviewed. The patient's tolerance of                            previous anesthesia was also reviewed. The risks                            and benefits of the procedure and the sedation                            options and risks were discussed with the patient.                            All questions were answered, and informed consent                            was obtained. Prior Anticoagulants: The patient has                            taken no previous anticoagulant or antiplatelet                            agents. ASA Grade Assessment: II - A patient with                            mild systemic disease. After reviewing the risks                            and benefits, the patient was deemed in                            satisfactory condition to undergo the procedure.  After obtaining informed consent, the colonoscope                            was passed under direct vision. Throughout the                            procedure, the patient's blood pressure, pulse, and                            oxygen saturations were monitored continuously. The                            Colonoscope was introduced through the anus and                            advanced to the the cecum, identified  by                            appendiceal orifice and ileocecal valve. The                            colonoscopy was performed without difficulty. The                            patient tolerated the procedure well. The quality                            of the bowel preparation was excellent. The                            terminal ileum, ileocecal valve, appendiceal                            orifice, and rectum were photographed. Scope In: 8:31:15 AM Scope Out: 8:47:27 AM Scope Withdrawal Time: 0 hours 13 minutes 14 seconds  Total Procedure Duration: 0 hours 16 minutes 12 seconds  Findings:                 The perianal and digital rectal examinations were                            normal.                           There was evidence of a patent end-to-side                            ileo-colonic anastomosis found in the neo-terminal                            Ileum. This was characterized by erosion, erythema,                            friable mucosa, severe stenosis and ulceration.  This could not be traversed. Biopsies were taken                            with a cold forceps for histology.                           Non-bleeding internal hemorrhoids were found during                            retroflexion. The hemorrhoids were small.                           Normal mucosa was found in the entire colon. Complications:            No immediate complications. Estimated Blood Loss:     Estimated blood loss was minimal. Impression:               - Patent end-to-side ileo-colonic anastomosis,                            characterized by erosion, erythema, friable mucosa,                            ulceration and severe stenosis. Biopsied.                           - Non-bleeding internal hemorrhoids.                           - Normal mucosa in the entire examined colon. Recommendation:           - Patient has a contact number available for                             emergencies. The signs and symptoms of potential                            delayed complications were discussed with the                            patient. Return to normal activities tomorrow.                            Written discharge instructions were provided to the                            patient.                           - Resume previous diet.                           - Continue present medications.                           - Await pathology results.                           -  Repeat colonoscopy date to be determined after                            pending pathology results are reviewed for                            surveillance.                           - Return to GI clinic in 2 months. Napoleon Form, MD 09/23/2016 8:56:11 AM This report has been signed electronically.

## 2016-09-24 ENCOUNTER — Telehealth: Payer: Self-pay | Admitting: *Deleted

## 2016-09-24 NOTE — Telephone Encounter (Signed)
  Follow up Call-  Call back number 09/23/2016  Post procedure Call Back phone  # [phone]  Permission to leave phone message Yes  Some recent data might be hidden     Patient questions:  Do you have a fever, pain , or abdominal swelling? No. Pain Score  0 *  Have you tolerated food without any problems? Yes.    Have you been able to return to your normal activities? Yes.    Do you have any questions about your discharge instructions: Diet   No. Medications  No. Follow up visit  No.  Do you have questions or concerns about your Care? No.  Actions: * If pain score is 4 or above: No action needed, pain <4.

## 2016-09-28 ENCOUNTER — Telehealth: Payer: Self-pay | Admitting: Gastroenterology

## 2016-09-28 DIAGNOSIS — K50018 Crohn's disease of small intestine with other complication: Secondary | ICD-10-CM

## 2016-09-28 MED ORDER — BUDESONIDE 3 MG PO CPEP: 9.0000 mg | ORAL_CAPSULE | Freq: Every day | ORAL | 1 refills | Status: DC

## 2016-09-28 NOTE — Telephone Encounter (Signed)
Patient returning Dr.Nandigam's call about results from colon on 7.5.18

## 2016-09-28 NOTE — Telephone Encounter (Signed)
Medication sent to pharmacy and lab orders put in for 7/19  Pt aware to have drawn

## 2016-09-28 NOTE — Telephone Encounter (Signed)
Patient called to check if the prescription was sent. Can you please send Rx for Entocort (Budesonide 9mg  daily) X 30 days with 1 refills and also put in labs for Remicade trough and Ab level on July 19th. Thanks

## 2016-10-07 ENCOUNTER — Other Ambulatory Visit: Payer: BLUE CROSS/BLUE SHIELD

## 2016-10-07 DIAGNOSIS — K50018 Crohn's disease of small intestine with other complication: Secondary | ICD-10-CM

## 2016-10-12 LAB — INFLIXIMAB (IFX) CONC+ IFX AB
Anti-Infliximab Antibody: <22 ng/mL
Infliximab Drug Level: 21 ug/mL

## 2016-10-19 ENCOUNTER — Telehealth: Payer: Self-pay | Admitting: Gastroenterology

## 2016-10-27 ENCOUNTER — Telehealth: Payer: Self-pay | Admitting: Gastroenterology

## 2016-10-28 ENCOUNTER — Emergency Department (HOSPITAL_COMMUNITY)
Admission: EM | Admit: 2016-10-28 | Discharge: 2016-10-28 | Disposition: A | Discharge status: Home / Self Care | Payer: BLUE CROSS/BLUE SHIELD | Attending: Emergency Medicine | Admitting: Emergency Medicine

## 2016-10-28 ENCOUNTER — Encounter (HOSPITAL_COMMUNITY): Payer: Self-pay | Admitting: Emergency Medicine

## 2016-10-28 DIAGNOSIS — W228XXA Striking against or struck by other objects, initial encounter: Secondary | ICD-10-CM | POA: Insufficient documentation

## 2016-10-28 DIAGNOSIS — Y999 Unspecified external cause status: Secondary | ICD-10-CM | POA: Diagnosis not present

## 2016-10-28 DIAGNOSIS — Z23 Encounter for immunization: Secondary | ICD-10-CM | POA: Diagnosis not present

## 2016-10-28 DIAGNOSIS — Y929 Unspecified place or not applicable: Secondary | ICD-10-CM | POA: Insufficient documentation

## 2016-10-28 DIAGNOSIS — S61411A Laceration without foreign body of right hand, initial encounter: Secondary | ICD-10-CM | POA: Diagnosis not present

## 2016-10-28 DIAGNOSIS — Z79899 Other long term (current) drug therapy: Secondary | ICD-10-CM | POA: Diagnosis not present

## 2016-10-28 DIAGNOSIS — Y9389 Activity, other specified: Secondary | ICD-10-CM | POA: Insufficient documentation

## 2016-10-28 DIAGNOSIS — S6991XA Unspecified injury of right wrist, hand and finger(s), initial encounter: Secondary | ICD-10-CM | POA: Diagnosis present

## 2016-10-28 MED ORDER — LIDOCAINE HCL 2 % IJ SOLN
20.0000 mL | Freq: Once | INTRAMUSCULAR | Status: AC
  Administered 2016-10-28: 400 mg via INTRADERMAL
  Filled 2016-10-28: qty 20

## 2016-10-28 MED ORDER — TETANUS-DIPHTH-ACELL PERTUSSIS 5-2.5-18.5 LF-MCG/0.5 IM SUSP
0.5000 mL | Freq: Once | INTRAMUSCULAR | Status: AC
  Administered 2016-10-28: 0.5 mL via INTRAMUSCULAR
  Filled 2016-10-28: qty 0.5

## 2016-10-28 NOTE — ED Triage Notes (Signed)
Pt has laceration between right thumb and index finger. Pt pulling on horse bridle and hook broke.

## 2016-10-28 NOTE — Telephone Encounter (Signed)
Called patient, unable to reach. Left a message for patient to call back. If she is not noticing any significant difference, can stop taking entocort. I wanted to try for 4 weeks. Follow up in office visit.

## 2016-10-28 NOTE — Discharge Instructions (Signed)
Keep area clean and dry for 24 hours. You can wash with soap and water Change bandage at least once daily, more if it is dirty Watch for signs of infection (redness, drainage) Have stitches removed in 10 days

## 2016-10-28 NOTE — Telephone Encounter (Signed)
Pt to call back, has appt already scheduled for 12/08/16@8 :30am.

## 2016-10-28 NOTE — ED Provider Notes (Signed)
[facility]EMERGENCY DEPT Provider Note   CSN: [id number] Arrival date & time: 10/28/16  2058     History   Chief Complaint Chief Complaint  Patient presents with  . Laceration    HPI Karen Knox is a 51 y.o.right hand dominant female who presents with a right hand laceration. She states that about 2-3 hours ago she was holding a lead rope which was attached to her horses' halter and the horse threw his had and caused the rope to cut in to her hand. It caused a laceration between her thumb and index finger. She was able to get bleeding under control and washed her hand prior to arrival. She has full range of motion of her thumb. She denies numbness, tingling, weakness. Tetanus is not up to date.  HPI  Past Medical History:  Diagnosis Date  . Anal fissure   . Arthritis    ? IBD related?  . B12 deficiency   . Crohn's disease (HCC)   . External hemorrhoids   . Gilbert's syndrome   . Internal hemorrhoids   . Iron deficiency anemia   . Small bowel stricture (HCC)    multiple 07/05/00  . TB (tuberculosis)    "latent"- was treated because pt was on Remicade; October 2016    Patient Active Problem List   Diagnosis Date Noted  . Crohn's colitis (HCC) 01/29/2015    Past Surgical History:  Procedure Laterality Date  . APPENDECTOMY     with initial colon resection including terminal ileum  . CHOLECYSTECTOMY    . COLON RESECTION  1993, 2002   x2  . COLONOSCOPY    . EXPLORATORY LAPAROTOMY  2002   with extensive lysis of adhesions, resection of ileocolonic anastomosis,resection segment small bowel; anastomosis of small bowel  to small bowel; ileocolic anastomosis  . UPPER GASTROINTESTINAL ENDOSCOPY      OB History    No data available       Home Medications    Prior to Admission medications   Medication Sig Start Date End Date Taking? Authorizing Provider  budesonide (ENTOCORT EC) 3 MG 24 hr capsule Take 3 capsules (9 mg total) by mouth daily. 09/28/16   Napoleon Form, MD  cyanocobalamin (,VITAMIN B-12,) 1000 MCG/ML injection Inject 1 mL (1,000 mcg total) into the muscle every 30 (thirty) days. 12/25/13   Hart Carwin, MD  inFLIXimab (REMICADE) 100 MG injection Inject 500 mg into the vein every 6 (six) weeks. 02/26/11   Hart Carwin, MD  mesalamine (LIALDA) 1.2 g EC tablet TAKE 4 TABLETS EVERY DAY 02/09/16   Nandigam, Eleonore Chiquito, MD  ondansetron (ZOFRAN-ODT) 8 MG disintegrating tablet TAKE 1 TABLET (8 MG TOTAL) BY MOUTH EVERY 8 (EIGHT) HOURS AS NEEDED FOR NAUSEA OR VOMITING. Patient not taking: Reported on 08/31/2016 03/20/15   Judyann Munson, MD  potassium chloride SA (K-DUR,KLOR-CON) 20 MEQ tablet Take 20 mEq by mouth daily.    [provider]    Family History Family History  Problem Relation Age of Onset  . Arthritis Father        and on mothers side  . Multiple sclerosis Father   . Colon cancer Father 81  . Melanoma Mother   . Esophageal cancer Neg Hx   . Rectal cancer Neg Hx   . Stomach cancer Neg Hx     Social History Social History  Substance Use Topics  . Smoking status: Never Smoker  . Smokeless tobacco: Never Used  . Alcohol use Yes  Comment: occasional     Allergies   Patient has no known allergies.   Review of Systems Review of Systems  Skin: Positive for wound.  Neurological: Negative for weakness and numbness.  Hematological: Does not bruise/bleed easily.     Physical Exam Updated Vital Signs BP (!) 167/102 (BP Location: Left Arm)   Pulse 67   Temp 98.5 F (36.9 C) (Oral)   Resp 16   Ht 5\' 3"  (1.6 m)   Wt 49.9 kg (110 lb)   SpO2 99%   BMI 19.49 kg/m   Physical Exam  Constitutional: She is oriented to person, place, and time. She appears well-developed and well-nourished. No distress.  HENT:  Head: Normocephalic and atraumatic.  Eyes: Pupils are equal, round, and reactive to light. Conjunctivae are normal. Right eye exhibits no discharge. Left eye exhibits no discharge. No scleral  icterus.  Neck: Normal range of motion.  Cardiovascular: Normal rate.   Pulmonary/Chest: Effort normal. No respiratory distress.  Abdominal: She exhibits no distension.  Neurological: She is alert and oriented to person, place, and time.  Skin: Skin is warm and dry.  4cm laceration in the web space between thumb and index finger. Bleeding is controlled. FROM of thumb and index finger. 2+ radial pulse  Psychiatric: She has a normal mood and affect. Her behavior is normal.  Nursing note and vitals reviewed.    ED Treatments / Results  Labs (all labs ordered are listed, but only abnormal results are displayed) Labs Reviewed - No data to display  EKG  EKG Interpretation None       Radiology No results found.  Procedures Procedures (including critical care time)  LACERATION REPAIR Performed by: Bethel Born Authorized by: Bethel Born Consent: Verbal consent obtained. Risks and benefits: risks, benefits and alternatives were discussed Consent given by: patient Patient identity confirmed: provided demographic data Prepped and Draped in normal sterile fashion Wound explored  Laceration Location: Right web space between thumb and index finger  Laceration Length: 4 cm  No Foreign Bodies seen or palpated  Anesthesia: local infiltration  Local anesthetic: lidocaine 2%  Anesthetic total: 8 ml  Irrigation method: Saf-Clens Amount of cleaning: standard  Skin closure: 4-0 Prolene  Number of sutures: 4  Technique: Simple interrupted  Patient tolerance: Patient tolerated the procedure well with no immediate complications.   Medications Ordered in ED Medications  lidocaine (XYLOCAINE) 2 % (with pres) injection 400 mg (400 mg Intradermal Given 10/28/16 2320)  Tdap (BOOSTRIX) injection 0.5 mL (0.5 mLs Intramuscular Given 10/28/16 2321)     Initial Impression / Assessment and Plan / ED Course  I have reviewed the triage vital signs and the nursing  notes.  Pertinent labs & imaging results that were available during my care of the patient were reviewed by me and considered in my medical decision making (see chart for details).  51 year old with right hand laceration. It was repaired and irrigated in the ED. Bottom of the wound visualized and bleeding controlled. 4 sutures placed. Wound care discussed and advised to return to have stitches removed in 10 days. Tdap was updated. Return precautions discussed.   Final Clinical Impressions(s) / ED Diagnoses   Final diagnoses:  Laceration of right hand without foreign body, initial encounter    New Prescriptions New Prescriptions   No medications on file     Beryle Quant 10/29/16 Barbette Reichmann, MD 11/01/16 [phone]

## 2016-10-28 NOTE — Telephone Encounter (Signed)
Patient called back. She feels better with entocort and low fiber diet. Advised her to stop Entocort as she completed the 4 weeks course. She will call back and let me know in 2 weeks if she notices any worsening of symptoms off entocort.  Will increased the Remicade infusion interval to every 8 weeks instead of every 6 weeks as drug trough was high at 21.  Follow up in office visit and will recheck Remicade trough again in few months.

## 2016-11-17 ENCOUNTER — Other Ambulatory Visit: Payer: Self-pay | Admitting: Gastroenterology

## 2016-11-19 ENCOUNTER — Other Ambulatory Visit: Payer: Self-pay | Admitting: Gastroenterology

## 2016-12-08 ENCOUNTER — Encounter: Payer: Self-pay | Admitting: Gastroenterology

## 2016-12-08 ENCOUNTER — Ambulatory Visit: Payer: BLUE CROSS/BLUE SHIELD | Admitting: Gastroenterology

## 2016-12-08 ENCOUNTER — Ambulatory Visit (INDEPENDENT_AMBULATORY_CARE_PROVIDER_SITE_OTHER): Payer: BLUE CROSS/BLUE SHIELD | Admitting: Gastroenterology

## 2016-12-08 VITALS — BP 122/80 | HR 64 | Ht 63.0 in | Wt 110.5 lb

## 2016-12-08 DIAGNOSIS — K50018 Crohn's disease of small intestine with other complication: Secondary | ICD-10-CM | POA: Diagnosis not present

## 2016-12-08 NOTE — Patient Instructions (Signed)
You will need to come to our lab in the basement at your 16th week Remicade treatment , the day before your treatment have these labs drawn  (4 months) )CBC CMET CRP Infliximab concentration)  The orders will be in the system for you to have them drawn  STOP Mesalamine   Follow up in 6 months

## 2016-12-08 NOTE — Progress Notes (Signed)
Karen Knox    [id number]    September 22, 1965  Primary Care Physician:Kim, Jeneen Rinks, MD  Referring Physician: Jani Gravel, MD [address] [place], [place] [address]  Chief complaint: Crohn's disease  HPI: 20 yr with h/o Crohn's ileitis is here for follow up visit. Colonoscopy 09/23/2016 shows near terminal ileal stricture with erosions and friable tissue, biopsies showed evidence of active ileitis. Patient treated with Entocort for 30 days with significant improvement.  Infliximab antibody level undetectable with elevated drug level of 21, Remicade infusion changed to every 8 weeks instead of every 6 weeks that she was getting previously. Last infusion on Dec 01, 2016.  She is avoiding high fiber diet and is doing small frequent meals.  Denies any nausea, vomiting, abdominal pain, melena or bright red blood per rectum    Outpatient Encounter Prescriptions as of 12/08/2016  Medication Sig  . budesonide (ENTOCORT EC) 3 MG 24 hr capsule TAKE 3 CAPSULES (9 MG TOTAL) BY MOUTH DAILY.  . cyanocobalamin (,VITAMIN B-12,) 1000 MCG/ML injection Inject 1 mL (1,000 mcg total) into the muscle every 30 (thirty) days.  . inFLIXimab (REMICADE) 100 MG injection Inject 500 mg into the vein every 6 (six) weeks.  . mesalamine (LIALDA) 1.2 g EC tablet TAKE 4 TABLETS EVERY DAY  . mesalamine (LIALDA) 1.2 g EC tablet TAKE 4 TABLETS EVERY DAY  . ondansetron (ZOFRAN-ODT) 8 MG disintegrating tablet TAKE 1 TABLET (8 MG TOTAL) BY MOUTH EVERY 8 (EIGHT) HOURS AS NEEDED FOR NAUSEA OR VOMITING. (Patient not taking: Reported on 08/31/2016)  . potassium chloride SA (K-DUR,KLOR-CON) 20 MEQ tablet Take 20 mEq by mouth daily.   Facility-Administered Encounter Medications as of 12/08/2016  Medication  . 0.9 %  sodium chloride infusion    Allergies as of 12/08/2016  . (No Known Allergies)    Past Medical History:  Diagnosis Date  . Anal fissure   . Arthritis    ? IBD related?  . B12  deficiency   . Crohn's disease ([facility])   . External hemorrhoids   . Gilbert's syndrome   . Internal hemorrhoids   . Iron deficiency anemia   . Small bowel stricture ([place])    multiple 07/05/00  . TB (tuberculosis)    "latent"- was treated because pt was on Remicade; October 2016    Past Surgical History:  Procedure Laterality Date  . APPENDECTOMY     with initial colon resection including terminal ileum  . CHOLECYSTECTOMY    . [place], 2002   x2  . COLONOSCOPY    . EXPLORATORY LAPAROTOMY  2002   with extensive lysis of adhesions, resection of ileocolonic anastomosis,resection segment small bowel; anastomosis of small bowel  to small bowel; ileocolic anastomosis  . UPPER GASTROINTESTINAL ENDOSCOPY      Family History  Problem Relation Age of Onset  . Arthritis Father        and on mothers side  . Multiple sclerosis Father   . Colon cancer Father 89  . Melanoma Mother   . Esophageal cancer Neg Hx   . Rectal cancer Neg Hx   . Stomach cancer Neg Hx     Social History   Social History  . Marital status: Legally Separated    Spouse name: N/A  . Number of children: 2  . Years of education: N/A   Occupational History  . [employer]     Social History Main Topics  .  Smoking status: Never Smoker  . Smokeless tobacco: Never Used  . Alcohol use Yes     Comment: occasional  . Drug use: No  . Sexual activity: Yes    Birth control/ protection: Other-see comments     Comment: pt has had vasectomy   Other Topics Concern  . Not on file   Social History Narrative  . No narrative on file      Review of systems: Review of Systems  Constitutional: Negative for fever and chills.  HENT: Negative.   Eyes: Negative for blurred vision.  Respiratory: Negative for cough, shortness of breath and wheezing.   Cardiovascular: Negative for chest pain and palpitations.  Gastrointestinal: as per HPI Genitourinary: Negative for dysuria, urgency, frequency and  hematuria.  Musculoskeletal: Negative for myalgias, back pain and joint pain.  Skin: Negative for itching and rash.  Neurological: Negative for dizziness, tremors, focal weakness, seizures and loss of consciousness.  Endo/Heme/Allergies: Positive for seasonal allergies.  Psychiatric/Behavioral: Negative for depression, suicidal ideas and hallucinations.  All other systems reviewed and are negative.   Physical Exam: Vitals:   12/08/16 0828  BP: 122/80  Pulse: 64   Body mass index is 19.57 kg/m. Gen:      No acute distress HEENT:  EOMI, sclera anicteric Neck:     No masses; no thyromegaly Lungs:    Clear to auscultation bilaterally; normal respiratory effort CV:         Regular rate and rhythm; no murmurs Abd:      + bowel sounds; soft, non-tender; no palpable masses, no distension Ext:    No edema; adequate peripheral perfusion Skin:      Warm and dry; no rash Neuro: alert and oriented x 3 Psych: normal mood and affect  Data Reviewed:  Reviewed labs, radiology imaging, old records and pertinent past GI work up   Assessment and Plan/Recommendations:  37 yr F with h/o Crohn's ileitis s/p terminal ileal resection with evidence of active inflammation and stricture in the terminal ileum on last colnoscopy in July 2018 Patient was treated with short course of entocort with improvement of symptoms Elevated Infliximab level, increased interval between infusion to every 8 weeks Recheck Infliximab trough in [redacted] weeks along with CBC, CMP and CRP  DC Mesalamine, reassess disease activity with colonoscopy in 6-8 months  Continue dietary and lifestyle modifications Avoid NSAID's Return in 6 months or sooner if needed 25 minutes was spent face-to-face with the patient. Greater than 50% of the time used for counseling as well as treatment plan and follow-up. She had multiple questions which were answered to her satisfaction  K. Denzil Magnuson , MD [phone] Mon-Fri 8a-5p [phone] after  5p, weekends, holidays  CC: Jani Gravel, MD

## 2016-12-22 ENCOUNTER — Telehealth: Payer: Self-pay | Admitting: Gastroenterology

## 2016-12-22 DIAGNOSIS — K5090 Crohn's disease, unspecified, without complications: Secondary | ICD-10-CM

## 2016-12-22 NOTE — Telephone Encounter (Signed)
Can patient have potassium rx refills?

## 2016-12-22 NOTE — Telephone Encounter (Signed)
Please advise patient to do a BMP check prior to Potassium refill, no recent labs

## 2016-12-22 NOTE — Telephone Encounter (Signed)
Patient states she needs potassium medication refilled at [facility]

## 2016-12-23 NOTE — Telephone Encounter (Signed)
Spoke with patient, we will refill Potassium, after she has the BMET drawn  Orders are in computer for BMET ONLY, The other lab orders are for Jan before remicade infusion

## 2017-01-12 ENCOUNTER — Telehealth: Payer: Self-pay

## 2017-01-17 NOTE — Telephone Encounter (Signed)
Last procedure done 01-25-11.which she did get billed $1067.50. She called patient accounts and made a payment on 03-10-11 with her [employer] card.  Looks like her procedures are not routine screening at all. Called pt back but she did not answer. Left a voicemail to return call.

## 2017-02-21 NOTE — Telephone Encounter (Signed)
Patient calling back if questions regarding the procedure she had this year, and would like to discuss this with someone else.

## 2017-03-24 ENCOUNTER — Telehealth: Payer: Self-pay

## 2017-03-24 NOTE — Telephone Encounter (Signed)
-----   Message from Marlowe Kays, CMA sent at 12/08/2016  9:02 AM EDT ----- Regarding: LABS BEFORE REMICADE  Make sure pt is reminded to have labs drawn before 16th week remicade treatment  Orders are in

## 2017-03-24 NOTE — Telephone Encounter (Signed)
Left a message on her voicemail reminding her to have her labs drawn as ordered.

## 2017-04-19 ENCOUNTER — Other Ambulatory Visit (INDEPENDENT_AMBULATORY_CARE_PROVIDER_SITE_OTHER): Payer: BLUE CROSS/BLUE SHIELD

## 2017-04-19 ENCOUNTER — Other Ambulatory Visit: Payer: Self-pay | Admitting: *Deleted

## 2017-04-19 DIAGNOSIS — K50018 Crohn's disease of small intestine with other complication: Secondary | ICD-10-CM

## 2017-04-19 LAB — COMPREHENSIVE METABOLIC PANEL
ALT: 13 U/L (ref 0–35)
AST: 13 U/L (ref 0–37)
Albumin: 3.7 g/dL (ref 3.5–5.2)
Alkaline Phosphatase: 54 U/L (ref 39–117)
BUN: 12 mg/dL (ref 6–23)
CO2: 28 mEq/L (ref 19–32)
Calcium: 8.5 mg/dL (ref 8.4–10.5)
Chloride: 104 mEq/L (ref 96–112)
Creatinine, Ser: 0.6 mg/dL (ref 0.40–1.20)
GFR: 112.78 mL/min (ref >60.00)
Glucose, Bld: 97 mg/dL (ref 70–99)
Potassium: 4.3 mEq/L (ref 3.5–5.1)
Sodium: 138 mEq/L (ref 135–145)
Total Bilirubin: 1.7 mg/dL — ABNORMAL HIGH (ref 0.2–1.2)
Total Protein: 6.8 g/dL (ref 6.0–8.3)

## 2017-04-19 LAB — CBC WITH DIFFERENTIAL/PLATELET
Basophils Absolute: 0.1 10*3/uL (ref 0.0–0.1)
Basophils Relative: 0.6 % (ref 0.0–3.0)
Eosinophils Absolute: 0.2 10*3/uL (ref 0.0–0.7)
Eosinophils Relative: 2 % (ref 0.0–5.0)
HCT: 41.3 % (ref 36.0–46.0)
Hemoglobin: 13.7 g/dL (ref 12.0–15.0)
Lymphocytes Relative: 35.2 % (ref 12.0–46.0)
Lymphs Abs: 2.9 10*3/uL (ref 0.7–4.0)
MCHC: 33.2 g/dL (ref 30.0–36.0)
MCV: 87.5 fl (ref 78.0–100.0)
Monocytes Absolute: 0.7 10*3/uL (ref 0.1–1.0)
Monocytes Relative: 8.6 % (ref 3.0–12.0)
Neutro Abs: 4.4 10*3/uL (ref 1.4–7.7)
Neutrophils Relative %: 53.6 % (ref 43.0–77.0)
Platelets: 340 10*3/uL (ref 150.0–400.0)
RBC: 4.72 Mil/uL (ref 3.87–5.11)
RDW: 14.5 % (ref 11.5–15.5)
WBC: 8.3 10*3/uL (ref 4.0–10.5)

## 2017-04-19 LAB — HIGH SENSITIVITY CRP: CRP, High Sensitivity: 1.18 mg/L (ref 0.000–5.000)

## 2017-04-28 LAB — INFLIXIMAB (IFX) CONC+ IFX AB
Anti-Infliximab Antibody: <22 ng/mL
Infliximab Drug Level: 11 ug/mL

## 2017-05-06 LAB — HM PAP SMEAR
HM Pap smear: NORMAL
HPV, high-risk: NEGATIVE

## 2017-05-10 ENCOUNTER — Other Ambulatory Visit: Payer: Self-pay | Admitting: Nurse Practitioner

## 2017-05-10 DIAGNOSIS — N6310 Unspecified lump in the right breast, unspecified quadrant: Secondary | ICD-10-CM

## 2017-05-16 ENCOUNTER — Ambulatory Visit
Admission: RE | Admit: 2017-05-16 | Discharge: 2017-05-16 | Disposition: A | Discharge status: Home / Self Care | Payer: BLUE CROSS/BLUE SHIELD | Source: Ambulatory Visit | Attending: Nurse Practitioner | Admitting: Nurse Practitioner

## 2017-05-16 DIAGNOSIS — N6310 Unspecified lump in the right breast, unspecified quadrant: Secondary | ICD-10-CM

## 2017-05-16 LAB — HM PAP SMEAR

## 2017-06-06 ENCOUNTER — Ambulatory Visit: Payer: BLUE CROSS/BLUE SHIELD | Admitting: Physician Assistant

## 2017-06-06 ENCOUNTER — Encounter: Payer: Self-pay | Admitting: Physician Assistant

## 2017-06-06 ENCOUNTER — Other Ambulatory Visit (INDEPENDENT_AMBULATORY_CARE_PROVIDER_SITE_OTHER): Payer: BLUE CROSS/BLUE SHIELD

## 2017-06-06 VITALS — BP 160/100 | HR 60 | Ht 63.0 in | Wt 116.2 lb

## 2017-06-06 DIAGNOSIS — D509 Iron deficiency anemia, unspecified: Secondary | ICD-10-CM

## 2017-06-06 DIAGNOSIS — R1084 Generalized abdominal pain: Secondary | ICD-10-CM | POA: Diagnosis not present

## 2017-06-06 DIAGNOSIS — K50919 Crohn's disease, unspecified, with unspecified complications: Secondary | ICD-10-CM

## 2017-06-06 LAB — IBC PANEL
Iron: 39 ug/dL — ABNORMAL LOW (ref 42–145)
Saturation Ratios: 8.2 % — ABNORMAL LOW (ref 20.0–50.0)
Transferrin: 338 mg/dL (ref 212.0–360.0)

## 2017-06-06 LAB — FERRITIN: Ferritin: 13.5 ng/mL (ref 10.0–291.0)

## 2017-06-06 NOTE — Progress Notes (Signed)
[facility] 

## 2017-06-06 NOTE — Progress Notes (Signed)
Chief Complaint: Crohn's disease, abdominal pain, reflux  HPI:    Karen Knox is a 52 year old female with a past medical history of Crohn's ileitis, who follows with Dr. Silverio Decamp and presents clinic today with a complaint of abdominal pain and reflux.    Last OV 12/08/16 had just been treated with Entocort for 30 days after her last colonoscopy 09/23/16 showing terminal ileal stricture with erosions and friable tissue with biopsies showing evidence of active ileitis.  Infliximab antibody level undetectable with elevated drug level of 21, Remicade infusion changed to every 8 weeks instead of every 6 weeks she was getting previously.  At that time it was recommended she have a repeat colonoscopy in a year and recheck of infliximab trough 16 weeks was ordered along with CBC, CMP and CRP.  She was told to DC mesalamine and reassess disease activity a colonoscopy in July of this year.    Today, presents to clinic and explains that she recently went from having Remicade infusions every 6 weeks to every 8 weeks back in September of last year.  She has noticed breakthrough symptoms of abdominal tightening and decreased food intake as well as diarrhea within weeks 6-8.  Patient is tired of living with these symptoms.  She tells me that when she was having infusions every 6 weeks this prevented these times.  These have not been consistent every day over the 2 weeks interim but are becoming more frequent.  Patient tells me what she was previously on 8 weeks more than 20 years ago at the beginning of her diagnosis and they found that she started with symptoms around 7 weeks, so this is why it was decreased to 6 weeks between infusions initially.      Also describes symptoms of burping constantly.  Describes that she feels as though she needs to burp in order to get food in.  Her family has heartburn and reflux problems and empirically she started Nexium 20 mg twice a day which she has been over on over the past  week.  Her symptoms have improved and she is wondering what to do from here.  She believes this may have had something to do with the iron supplementation she was on for a decreased ferritin per Dr. Silverio Decamp at the end of last year.  She is not currently on iron any longer and also wonders what her levels are like.  Does describe some dizzy spells she was having when her iron was low and these have gotten better.    Denies fever, chills, blood in her stool, melena, weight loss, anorexia, nausea or vomiting.  Past Medical History:  Diagnosis Date  . Anal fissure   . Arthritis    ? IBD related?  . B12 deficiency   . Crohn's disease ([place])   . External hemorrhoids   . Gilbert's syndrome   . Internal hemorrhoids   . Iron deficiency anemia   . Small bowel stricture ([place])    multiple 07/05/00  . TB (tuberculosis)    "latent"- was treated because pt was on Remicade; October 2016    Past Surgical History:  Procedure Laterality Date  . APPENDECTOMY     with initial colon resection including terminal ileum  . CHOLECYSTECTOMY    . [place], 2002   x2  . COLONOSCOPY    . EXPLORATORY LAPAROTOMY  2002   with extensive lysis of adhesions, resection of ileocolonic anastomosis,resection segment small bowel; anastomosis of small bowel  to  small bowel; ileocolic anastomosis  . UPPER GASTROINTESTINAL ENDOSCOPY      Current Outpatient Medications  Medication Sig Dispense Refill  . cyanocobalamin (,VITAMIN B-12,) 1000 MCG/ML injection Inject 1 mL (1,000 mcg total) into the muscle every 30 (thirty) days. 10 mL 0  . inFLIXimab (REMICADE) 100 MG injection Inject 500 mg into the vein every 6 (six) weeks. 1 each 3  . ondansetron (ZOFRAN-ODT) 8 MG disintegrating tablet TAKE 1 TABLET (8 MG TOTAL) BY MOUTH EVERY 8 (EIGHT) HOURS AS NEEDED FOR NAUSEA OR VOMITING. 20 tablet 1   Current Facility-Administered Medications  Medication Dose Route Frequency Provider Last Rate Last Dose  . 0.9 %  sodium  chloride infusion  500 mL Intravenous Continuous Mauri Pole, MD        Allergies as of 06/06/2017  . (No Known Allergies)    Family History  Problem Relation Age of Onset  . Arthritis Father        and on mothers side  . Multiple sclerosis Father   . Colon cancer Father 85  . Melanoma Mother   . Breast cancer Mother 64  . Esophageal cancer Neg Hx   . Rectal cancer Neg Hx   . Stomach cancer Neg Hx     Social History   Socioeconomic History  . Marital status: Legally Separated    Spouse name: Not on file  . Number of children: 2  . Years of education: Not on file  . Highest education level: Not on file  Social Needs  . Financial resource strain: Not on file  . Food insecurity - worry: Not on file  . Food insecurity - inability: Not on file  . Transportation needs - medical: Not on file  . Transportation needs - non-medical: Not on file  Occupational History  . Occupation: [employer]   Tobacco Use  . Smoking status: Never Smoker  . Smokeless tobacco: Never Used  Substance and Sexual Activity  . Alcohol use: Yes    Comment: occasional  . Drug use: No  . Sexual activity: Yes    Birth control/protection: Other-see comments    Comment: pt has had vasectomy  Other Topics Concern  . Not on file  Social History Narrative  . Not on file    Review of Systems:    Constitutional: No weight loss, fever or chills Cardiovascular: No chest pain   Respiratory: No SOB  Gastrointestinal: See HPI and otherwise negative   Physical Exam:  Vital signs: BP (!) 160/100 (BP Location: Left Arm, Patient Position: Sitting, Cuff Size: Normal)   Pulse 60   Ht _0  (1.6 m)   Wt 116 lb 4 oz (52.7 kg)   LMP 05/18/2017   BMI 20.59 kg/m   Constitutional:   Pleasant Caucasian female appears to be in NAD, Well developed, Well nourished, alert and cooperative Head:  Normocephalic and atraumatic. Eyes:   PEERL, EOMI. No icterus. Conjunctiva pink. Ears:  Normal auditory  acuity. Neck:  Supple Throat: Oral cavity and pharynx without inflammation, swelling or lesion.  Respiratory: Respirations even and unlabored. Lungs clear to auscultation bilaterally.   No wheezes, crackles, or rhonchi.  Cardiovascular: Normal S1, S2. No MRG. Regular rate and rhythm. No peripheral edema, cyanosis or pallor.  Gastrointestinal:  Soft, nondistended, mild generalized ttp. No rebound or guarding. Normal bowel sounds. No appreciable masses or hepatomegaly. Rectal:  Not performed.  Msk:  Symmetrical without gross deformities. Without edema, no deformity or joint abnormality.  Neurologic:  Alert and  oriented x4;  grossly normal neurologically.  Skin:   Dry and intact without significant lesions or rashes. Psychiatric: Demonstrates good judgement and reason without abnormal affect or behaviors.  RELEVANT LABS AND IMAGING: CBC    Component Value Date/Time   WBC 8.3 04/19/2017 1027   RBC 4.72 04/19/2017 1027   HGB 13.7 04/19/2017 1027   HCT 41.3 04/19/2017 1027   PLT 340.0 04/19/2017 1027   MCV 87.5 04/19/2017 1027   MCH 28.6 10/22/2011 1737   MCHC 33.2 04/19/2017 1027   RDW 14.5 04/19/2017 1027   LYMPHSABS 2.9 04/19/2017 1027   MONOABS 0.7 04/19/2017 1027   EOSABS 0.2 04/19/2017 1027   BASOSABS 0.1 04/19/2017 1027    CMP     Component Value Date/Time   NA 138 04/19/2017 1027   K 4.3 04/19/2017 1027   CL 104 04/19/2017 1027   CO2 28 04/19/2017 1027   GLUCOSE 97 04/19/2017 1027   BUN 12 04/19/2017 1027   CREATININE 0.60 04/19/2017 1027   CREATININE 0.60 10/22/2011 1737   CALCIUM 8.5 04/19/2017 1027   PROT 6.8 04/19/2017 1027   ALBUMIN 3.7 04/19/2017 1027   AST 13 04/19/2017 1027   ALT 13 04/19/2017 1027   ALKPHOS 54 04/19/2017 1027   BILITOT 1.7 (H) 04/19/2017 1027   GFRNONAA >60 05/14/2009 1342   GFRAA  05/14/2009 1342    >60        The eGFR has been calculated using the MDRD equation. This calculation has not been validated in all  clinical situations. eGFR's persistently <60 mL/min signify possible Chronic Kidney Disease.    Assessment: 1.  Crohn's ileitis: Status post terminal ileal resection with evidence of active inflammation and stricture in the terminal ileum on last colonoscopy in July 2018, treated with a short course of Entocort with improvement of symptoms, elevated infliximab level, interval increase between infusions from 6 to every 8 weeks.  Recent infliximab trough normal in January.  Patient continued on this regimen, but now with intermittent symptoms on 8-week regimen, Mesalamine was also DC'd recently 2.  GERD: Better on Nexium 20 mg twice daily, described as burping 3. IDA  Plan: 1.  Recommend patient stay on her OTC Nexium 20 mg twice daily which she has been taking for the past week for 2 weeks.  She may then discontinue and see how she feels.  If she feels the need to restart due to symptoms would recommend she start 20 mg daily initially. 2.  Ordered labs include an iron panel and ferritin 3.  We had a very long discussion regarding the necessity of repeat colonoscopy in July of this year and why this cannot be classified as a "screening" and how in fact none of her colonoscopies would be classified as "screening".  Patient was discussing cost as her insurance is not able to cover much of these colonoscopies.  Discussed that she is at increased risk for colon cancer with her history of Crohn's and with known inflammation in her ileum at time of last colonoscopy, we need to ensure that this is better on her current medication regimen or we may need to discuss changing medication regimens completely.  This makes patient very nervous as she has done well "symptom wise" on the Remicade over the past 27 years.  Explained that I will let her and Dr. Silverio Decamp have this discussion further. 4.  Patient requesting to move her Remicade infusions back to every 6 weeks.  These were lengthened due to elevated drug  levels at last check in the fall.  Patient had previously been on this regimen for 20+ years.  She is due for her next infusion on 28 March and believes she can make it until then.  Will discuss with Dr. Silverio Decamp prior to need for another dose. 5.  Offered Entocort and patient declined. 6. Could consider more regular monitoring with CRP/ESR and Lactoferrin, will leave decision to Dr. Silverio Decamp 7.  Patient scheduled for follow-up with Dr. Silverio Decamp in 2 months so they can discuss the need for colonoscopies further and possibly other biologics  Over 40 minutes spent in discussion/education with this patient.  Ellouise Newer, PA-C [facility] 06/06/2017, 12:34 PM  Cc: Jani Gravel, MD

## 2017-06-06 NOTE — Patient Instructions (Signed)
If you are age 50 or older, your body mass index should be between 23-30. Your Body mass index is 20.59 kg/m. If this is out of the aforementioned range listed, please consider follow up with your Primary Care Provider.  If you are age 1 or younger, your body mass index should be between 19-25. Your Body mass index is 20.59 kg/m. If this is out of the aformentioned range listed, please consider follow up with your Primary Care Provider.   Your physician has requested that you go to the basement for  work before leaving today.  Please purchase the following medications over the counter and take as directed: Nexium 20mg  two times daily for two bottles worth then stop.

## 2017-06-13 NOTE — Progress Notes (Signed)
See Dr. Sharol Given addendum on my last note. Please restart oral iron and order recheck of infliximab levels prior to next infusion. Dr. Lavon Paganini does not want to change to 6 week dosing yet. Thanks-JLL

## 2017-06-13 NOTE — Progress Notes (Signed)
Reviewed and agree with documentation and assessment and plan. We can recheck Infliximab drug trough and Ab level prior to next infusion to check if her drug levels are in the therapeutic range. CRP is normal. Ferritin and iron saturation is low, restart oral iron.  [name] , MD

## 2017-06-20 ENCOUNTER — Telehealth: Payer: Self-pay

## 2017-06-20 DIAGNOSIS — K50119 Crohn's disease of large intestine with unspecified complications: Secondary | ICD-10-CM

## 2017-06-20 NOTE — Telephone Encounter (Signed)
-----   Message from Unk Lightning, [place] sent at 06/13/2017  9:15 AM EDT -----   ----- Message ----- From: Napoleon Form, MD Sent: 06/13/2017   9:11 AM To: Unk Lightning, PA    ----- Message ----- From: Unk Lightning, [place] Sent: 06/06/2017   1:10 PM To: Napoleon Form, MD

## 2017-06-20 NOTE — Telephone Encounter (Signed)
Left message on machine to call back  

## 2017-06-20 NOTE — Telephone Encounter (Signed)
Author: Unk Lightning, PA Service: Gastroenterology Author Type: Physician Assistant  Filed: 06/13/2017 9:15 AM Encounter Date: 06/06/2017 Status: Signed  Editor: Unk Lightning, PA (Physician Assistant)       [] Hide copied text  [] Hover for details   See Dr. Sharol Given addendum on my last note. Please restart oral iron and order recheck of infliximab levels prior to next infusion. Dr. Lavon Paganini does not want to change to 6 week dosing yet. Thanks-JLL

## 2017-06-21 NOTE — Telephone Encounter (Signed)
The pt has been advised to have labs prior to next infusion and restart oral iron.  Lab order is in [employer]

## 2017-07-21 ENCOUNTER — Encounter: Payer: Self-pay | Admitting: Gastroenterology

## 2017-07-21 ENCOUNTER — Ambulatory Visit: Payer: BLUE CROSS/BLUE SHIELD | Admitting: Gastroenterology

## 2017-07-21 VITALS — BP 132/76 | HR 64 | Ht 63.0 in | Wt 114.1 lb

## 2017-07-21 DIAGNOSIS — E611 Iron deficiency: Secondary | ICD-10-CM

## 2017-07-21 DIAGNOSIS — Z79899 Other long term (current) drug therapy: Secondary | ICD-10-CM | POA: Diagnosis not present

## 2017-07-21 DIAGNOSIS — K50919 Crohn's disease, unspecified, with unspecified complications: Secondary | ICD-10-CM

## 2017-07-21 DIAGNOSIS — Z79620 Long term (current) use of immunosuppressive biologic: Secondary | ICD-10-CM

## 2017-07-21 NOTE — Patient Instructions (Addendum)
If you are age 50 or older, your body mass index should be between 23-30. Your Body mass index is 20.22 kg/m. If this is out of the aforementioned range listed, please consider follow up with your Primary Care Provider.  If you are age 52 or younger, your body mass index should be between 19-25. Your Body mass index is 20.22 kg/m. If this is out of the aformentioned range listed, please consider follow up with your Primary Care Provider.   Your provider has requested that you go to the basement level for lab work on 08/09/2017. Press "B" on the elevator. The lab is located at the first door on the left as you exit the elevator.  Please purchase the following medications over the counter and take as directed: Ferrous Gluconate 1 tablet twice daily.  IB Gard I capsule three times daily as needed.    Thank you for choosing [facility]  Dr. Lavon Paganini

## 2017-07-21 NOTE — Progress Notes (Signed)
Karen Knox    [id number]    10-03-1965  Primary Care Physician:Kim, Fayrene Fearing, MD  Referring Physician: Pearson Grippe, MD [address] [place], [place] [address]  Chief complaint: Crohn's disease HPI:  52 year old female with history of Crohn's ileitis status post terminal ileal resection with ileocolonic anastomosis.  Colonoscopy July 2018 showed terminal ileal stricture with erosions and friable tissue, biopsies showed evidence of active ileitis.  She was treated with Entocort for 30 days with improvement of symptoms. Infliximab infusion frequency was changed from every 8 weeks to every 6 weeks after noted elevated drug trough of 21 with undetectable antibody October 07, 2016.  Repeat infliximab drug trough was 11 with undetectable antibodies on April 19, 2017. Since the infusion was switched to every 8 weeks she is noticing mild abdominal bloating and cramps about 2 to 3 weeks prior to her infusion date. Denies any nausea, vomiting, abdominal pain, melena or bright red blood per rectum   Outpatient Encounter Medications as of 07/21/2017  Medication Sig  . cyanocobalamin (,VITAMIN B-12,) 1000 MCG/ML injection Inject 1 mL (1,000 mcg total) into the muscle every 30 (thirty) days.  . inFLIXimab (REMICADE) 100 MG injection Inject 500 mg into the vein every 6 (six) weeks.  . [DISCONTINUED] ondansetron (ZOFRAN-ODT) 8 MG disintegrating tablet TAKE 1 TABLET (8 MG TOTAL) BY MOUTH EVERY 8 (EIGHT) HOURS AS NEEDED FOR NAUSEA OR VOMITING.   Facility-Administered Encounter Medications as of 07/21/2017  Medication  . 0.9 %  sodium chloride infusion    Allergies as of 07/21/2017  . (No Known Allergies)    Past Medical History:  Diagnosis Date  . Anal fissure   . Arthritis    ? IBD related?  . B12 deficiency   . Crohn's disease ([facility])   . External hemorrhoids   . Gilbert's syndrome   . Internal hemorrhoids   . Iron deficiency anemia   . Small bowel stricture ([facility])     multiple 07/05/00  . TB (tuberculosis)    "latent"- was treated because pt was on Remicade; October 2016    Past Surgical History:  Procedure Laterality Date  . APPENDECTOMY     with initial colon resection including terminal ileum  . CHOLECYSTECTOMY    . COLON RESECTION  1993, 2002   x2  . COLONOSCOPY    . EXPLORATORY LAPAROTOMY  2002   with extensive lysis of adhesions, resection of ileocolonic anastomosis,resection segment small bowel; anastomosis of small bowel  to small bowel; ileocolic anastomosis  . UPPER GASTROINTESTINAL ENDOSCOPY      Family History  Problem Relation Age of Onset  . Arthritis Father        and on mothers side  . Multiple sclerosis Father   . Colon cancer Father 57  . Melanoma Mother   . Breast cancer Mother 59  . Esophageal cancer Neg Hx   . Rectal cancer Neg Hx   . Stomach cancer Neg Hx     Social History   Socioeconomic History  . Marital status: Legally Separated    Spouse name: Not on file  . Number of children: 2  . Years of education: Not on file  . Highest education level: Not on file  Occupational History  . Occupation: Field seismologist   Social Needs  . Financial resource strain: Not on file  . Food insecurity:    Worry: Not on file    Inability: Not on file  .  Transportation needs:    Medical: Not on file    Non-medical: Not on file  Tobacco Use  . Smoking status: Never Smoker  . Smokeless tobacco: Never Used  Substance and Sexual Activity  . Alcohol use: Yes    Comment: occasional  . Drug use: No  . Sexual activity: Yes    Birth control/protection: Other-Knox comments    Comment: pt has had vasectomy  Lifestyle  . Physical activity:    Days per week: Not on file    Minutes per session: Not on file  . Stress: Not on file  Relationships  . Social connections:    Talks on phone: Not on file    Gets together: Not on file    Attends religious service: Not on file    Active member of club or organization: Not on file     Attends meetings of clubs or organizations: Not on file    Relationship status: Not on file  . Intimate partner violence:    Fear of current or ex partner: Not on file    Emotionally abused: Not on file    Physically abused: Not on file    Forced sexual activity: Not on file  Other Topics Concern  . Not on file  Social History Narrative  . Not on file      Review of systems: Review of Systems  Constitutional: Negative for fever and chills.  HENT: Negative.   Eyes: Negative for blurred vision.  Respiratory: Negative for cough, shortness of breath and wheezing.   Cardiovascular: Negative for chest pain and palpitations.  Gastrointestinal: as per HPI Genitourinary: Negative for dysuria, urgency, frequency and hematuria.  Musculoskeletal: Negative for myalgias, back pain and joint pain.  Skin: Negative for itching and rash.  Neurological: Negative for dizziness, tremors, focal weakness, seizures and loss of consciousness.  Endo/Heme/Allergies: Negative for seasonal allergies.  Psychiatric/Behavioral: Negative for depression, suicidal ideas and hallucinations.  All other systems reviewed and are negative.   Physical Exam: Vitals:   07/21/17 0837  BP: 132/76  Pulse: 64   Body mass index is 20.22 kg/m. Gen:      No acute distress HEENT:  EOMI, sclera anicteric Neck:     No masses; no thyromegaly Lungs:    Clear to auscultation bilaterally; normal respiratory effort CV:         Regular rate and rhythm; no murmurs Abd:      + bowel sounds; soft, non-tender; no palpable masses, no distension Ext:    No edema; adequate peripheral perfusion Skin:      Warm and dry; no rash Neuro: alert and oriented x 3 Psych: normal mood and affect  Data Reviewed:  Reviewed labs, radiology imaging, old records and pertinent past GI work up   Assessment and Plan/Recommendations: 52 year old female with history of Crohn's disease, ileitis status post terminal ileal resection with  ileocolonic anastomosis on chronic infliximab infusion here for follow-up visit Patient started noticing mild bloating and abdominal cramps since her infusion frequency was switched from every 6 weeks to every 8 weeks She did have features suggestive of active ileitis on biopsies in July 2018 At this point it is unclear if her symptoms are related to irritable bowel syndrome versus active Crohn's disease versus secondary to ileocolonic anastomosis with altered anatomy and adhesions Will recheck infliximab drug trough and antibody level, if drug trough is lower than 10, we will consider switching her infusion back to every 6 weeks Trial of IBgard 1 capsule up  to 3 times daily as needed Low ferritin and iron saturation: Advised patient to start ferrous gluconate 1 tablet twice daily with meals  25 minutes was spent face-to-face with the patient. Greater than 50% of the time used for counseling as well as treatment plan and follow-up. She had multiple questions which were answered to her satisfaction  K. Scherry Ran , MD [phone]    CC: Pearson Grippe, MD

## 2017-07-26 ENCOUNTER — Encounter: Payer: Self-pay | Admitting: Gastroenterology

## 2017-08-16 ENCOUNTER — Other Ambulatory Visit: Payer: Self-pay

## 2017-08-16 ENCOUNTER — Other Ambulatory Visit (INDEPENDENT_AMBULATORY_CARE_PROVIDER_SITE_OTHER): Payer: BLUE CROSS/BLUE SHIELD

## 2017-08-16 DIAGNOSIS — E611 Iron deficiency: Secondary | ICD-10-CM | POA: Diagnosis not present

## 2017-08-16 DIAGNOSIS — K50919 Crohn's disease, unspecified, with unspecified complications: Secondary | ICD-10-CM

## 2017-08-16 LAB — C-REACTIVE PROTEIN: CRP: 0.1 mg/dL — ABNORMAL LOW (ref 0.5–20.0)

## 2017-08-20 LAB — INFLIXIMAB (IFX) CONC+ IFX AB
Anti-Infliximab Antibody: <22 ng/mL
Infliximab Drug Level: 8 ug/mL

## 2018-01-16 ENCOUNTER — Encounter: Payer: Self-pay | Admitting: Gastroenterology

## 2018-02-02 LAB — CBC AND DIFFERENTIAL
HCT: 43 (ref 36–46)
Hemoglobin: 14 (ref 12.0–16.0)
Neutrophils Absolute: 47
Platelets: 346 (ref 150–399)
WBC: 6

## 2018-02-02 LAB — BASIC METABOLIC PANEL
BUN: 11 (ref 4–21)
Creatinine: 0.8 (ref 0.5–1.1)
Glucose: 47
Potassium: 3.9 (ref 3.4–5.3)
Sodium: 142 (ref 137–147)

## 2018-02-02 LAB — HEPATIC FUNCTION PANEL
ALT: 18 (ref 7–35)
AST: 18 (ref 13–35)
Alkaline Phosphatase: 67 (ref 25–125)
Bilirubin, Total: 1.6

## 2018-02-14 ENCOUNTER — Ambulatory Visit: Payer: BLUE CROSS/BLUE SHIELD | Admitting: Family Medicine

## 2018-02-14 ENCOUNTER — Other Ambulatory Visit: Payer: Self-pay

## 2018-02-14 ENCOUNTER — Encounter: Payer: Self-pay | Admitting: Family Medicine

## 2018-02-14 VITALS — BP 112/78 | HR 68 | Temp 98.1°F | Resp 16 | Ht 63.0 in | Wt 115.4 lb

## 2018-02-14 DIAGNOSIS — E559 Vitamin D deficiency, unspecified: Secondary | ICD-10-CM

## 2018-02-14 DIAGNOSIS — Z23 Encounter for immunization: Secondary | ICD-10-CM

## 2018-02-14 DIAGNOSIS — Z0000 Encounter for general adult medical examination without abnormal findings: Secondary | ICD-10-CM | POA: Insufficient documentation

## 2018-02-14 DIAGNOSIS — Z13220 Encounter for screening for lipoid disorders: Secondary | ICD-10-CM | POA: Diagnosis not present

## 2018-02-14 LAB — VITAMIN D 25 HYDROXY (VIT D DEFICIENCY, FRACTURES): VITD: 17.73 ng/mL — ABNORMAL LOW (ref 30.00–100.00)

## 2018-02-14 LAB — LIPID PANEL
Cholesterol: 141 mg/dL (ref 0–200)
HDL: 70.5 mg/dL (ref >39.00)
LDL Cholesterol: 35 mg/dL (ref 0–99)
NonHDL: 70.9
Total CHOL/HDL Ratio: 2
Triglycerides: 180 mg/dL — ABNORMAL HIGH (ref 0.0–149.0)
VLDL: 36 mg/dL (ref 0.0–40.0)

## 2018-02-14 NOTE — Assessment & Plan Note (Signed)
Pt's PE WNL.  UTD on GYN, colonoscopy, Tdap.  Due for Pneumovax- given today.  Reviewed labs from Dr Dierdre Forth- WNL.  Will get screening lipids and Vit D as these were not done.  Anticipatory guidance provided.

## 2018-02-14 NOTE — Addendum Note (Signed)
Addended by: Davis Gourd on: 02/14/2018 09:18 AM   Modules accepted: Orders

## 2018-02-14 NOTE — Assessment & Plan Note (Signed)
Pt has hx of this.  Check labs.  Replete prn. 

## 2018-02-14 NOTE — Progress Notes (Signed)
   Subjective:    Patient ID: Karen Knox, female    DOB: [birth year], 52 y.o.   MRN: [id number]  HPI New to establish.  Previous MD- Selena B[facility] but has not seen recently)  UTD on pap, mammo.  UTD on Tdap.  Due for Pneumovax (q5 schedule due to Remicade).  Declines flu shot.   Review of Systems Patient reports no vision/ hearing changes, adenopathy,fever, weight change,  persistant/recurrent hoarseness , swallowing issues, chest pain, palpitations, edema, persistant/recurrent cough, hemoptysis, dyspnea (rest/exertional/paroxysmal nocturnal), gastrointestinal bleeding (melena, rectal bleeding), abdominal pain, significant heartburn, bowel changes, GU symptoms (dysuria, hematuria, incontinence), Gyn symptoms (abnormal  bleeding, pain),  syncope, focal weakness, memory loss, numbness & tingling, skin/hair/nail changes, abnormal bruising or bleeding, anxiety, or depression.     Objective:   Physical Exam General Appearance:    Alert, cooperative, no distress, appears stated age  Head:    Normocephalic, without obvious abnormality, atraumatic  Eyes:    PERRL, conjunctiva/corneas clear, EOM's intact, fundi    benign, both eyes  Ears:    Normal TM's and external ear canals, both ears  Nose:   Nares normal, septum midline, mucosa normal, no drainage    or sinus tenderness  Throat:   Lips, mucosa, and tongue normal; teeth and gums normal  Neck:   Supple, symmetrical, trachea midline, no adenopathy;    Thyroid: no enlargement/tenderness/nodules  Back:     Symmetric, no curvature, ROM normal, no CVA tenderness  Lungs:     Clear to auscultation bilaterally, respirations unlabored  Chest Wall:    No tenderness or deformity   Heart:    Regular rate and rhythm, S1 and S2 normal, no murmur, rub   or gallop  Breast Exam:    Deferred to GYN  Abdomen:     Soft, non-tender, bowel sounds active all four quadrants,    no masses, no organomegaly  Genitalia:    Deferred to GYN  Rectal:      Extremities:   Extremities normal, atraumatic, no cyanosis or edema  Pulses:   2+ and symmetric all extremities  Skin:   Skin color, texture, turgor normal, no rashes or lesions  Lymph nodes:   Cervical, supraclavicular, and axillary nodes normal  Neurologic:   CNII-XII intact, normal strength, sensation and reflexes    throughout          Assessment & Plan:

## 2018-02-14 NOTE — Patient Instructions (Signed)
Follow up in 1 year or as needed We'll notify you of your lab results and make any changes if needed Keep up the good work!  You look great!! Call with any questions or concerns Welcome!  We're glad to have you! Happy Holidays!!!

## 2018-02-15 ENCOUNTER — Other Ambulatory Visit: Payer: Self-pay | Admitting: *Deleted

## 2018-02-15 ENCOUNTER — Encounter: Payer: Self-pay | Admitting: General Practice

## 2018-02-15 MED ORDER — VITAMIN D (ERGOCALCIFEROL) 1.25 MG (50000 UNIT) PO CAPS: 50000.0000 [IU] | ORAL_CAPSULE | ORAL | 0 refills | Status: DC

## 2018-02-20 ENCOUNTER — Ambulatory Visit: Payer: Self-pay | Admitting: *Deleted

## 2018-02-20 ENCOUNTER — Ambulatory Visit: Payer: Self-pay

## 2018-02-20 NOTE — Telephone Encounter (Signed)
Received Pneumonia vaccine on 02/14/18. Reports that evening having fever, aches, redness,itching  And swelling from the site to the elbow. All of these symptoms resolved within 3-4 days. Patient wanted this information on her record. No triage completed.  Answer Assessment - Initial Assessment Questions 1. SYMPTOMS: "What is the main symptom?" (e.g., redness, swelling, pain)      No symptoms at this time. She had redness, swelling, itching, body aching and fever after the Pneumonia vaccine was given. 2. ONSET: "When was the vaccine (shot) given?" "How much later did the __ begin?" (e.g., hours, days ago)      Later in the evening.  3. SEVERITY: "How bad is it?"       4. FEVER: "Is there a fever?" If so, ask: "What is it, how was it measured, and when did it start?"      Could not say how high. 5. IMMUNIZATIONS GIVEN: "What shots have you recently received?"     Pneumonia 6. PAST REACTIONS: "Have you reacted to immunizations before?" If so, ask: "What happened?"     no 7. OTHER SYMPTOMS: "Do you have any other symptoms?"     none  Protocols used: IMMUNIZATION REACTIONS-A-AH

## 2018-03-03 ENCOUNTER — Ambulatory Visit: Payer: BLUE CROSS/BLUE SHIELD | Admitting: Family Medicine

## 2018-05-03 ENCOUNTER — Other Ambulatory Visit: Payer: Self-pay | Admitting: Family Medicine

## 2018-11-22 ENCOUNTER — Ambulatory Visit (INDEPENDENT_AMBULATORY_CARE_PROVIDER_SITE_OTHER): Payer: BC Managed Care – PPO | Admitting: Family Medicine

## 2018-11-22 ENCOUNTER — Encounter: Payer: Self-pay | Admitting: Family Medicine

## 2018-11-22 ENCOUNTER — Other Ambulatory Visit: Payer: Self-pay

## 2018-11-22 VITALS — BP 120/82 | HR 76 | Temp 97.9°F | Resp 16 | Ht 63.0 in | Wt 113.2 lb

## 2018-11-22 DIAGNOSIS — K50119 Crohn's disease of large intestine with unspecified complications: Secondary | ICD-10-CM

## 2018-11-22 DIAGNOSIS — Z23 Encounter for immunization: Secondary | ICD-10-CM

## 2018-11-22 DIAGNOSIS — Z0000 Encounter for general adult medical examination without abnormal findings: Secondary | ICD-10-CM

## 2018-11-22 DIAGNOSIS — E559 Vitamin D deficiency, unspecified: Secondary | ICD-10-CM | POA: Diagnosis not present

## 2018-11-22 NOTE — Progress Notes (Signed)
   Subjective:    Patient ID: Karen Knox, female    DOB: January 02, 1966, 53 y.o.   MRN: [id number]  HPI CPE- UTD on pap, mammo (GYN), colonoscopy, Tdap.  Due for flu.  No concerns.   Review of Systems Patient reports no vision/ hearing changes, adenopathy,fever, weight change,  persistant/recurrent hoarseness , swallowing issues, chest pain, palpitations, edema, persistant/recurrent cough, hemoptysis, dyspnea (rest/exertional/paroxysmal nocturnal), gastrointestinal bleeding (melena, rectal bleeding), abdominal pain, significant heartburn, bowel changes, GU symptoms (dysuria, hematuria, incontinence), Gyn symptoms (abnormal  bleeding, pain),  syncope, focal weakness, memory loss, numbness & tingling, skin/hair/nail changes, abnormal bruising or bleeding, anxiety, or depression.     Objective:   Physical Exam General Appearance:    Alert, cooperative, no distress, appears stated age  Head:    Normocephalic, without obvious abnormality, atraumatic  Eyes:    PERRL, conjunctiva/corneas clear, EOM's intact, fundi    benign, both eyes  Ears:    Normal TM's and external ear canals, both ears  Nose:   Nares normal, septum midline, mucosa normal, no drainage    or sinus tenderness  Throat:   Lips, mucosa, and tongue normal; teeth and gums normal  Neck:   Supple, symmetrical, trachea midline, no adenopathy;    Thyroid: no enlargement/tenderness/nodules  Back:     Symmetric, no curvature, ROM normal, no CVA tenderness  Lungs:     Clear to auscultation bilaterally, respirations unlabored  Chest Wall:    No tenderness or deformity   Heart:    Regular rate and rhythm, S1 and S2 normal, no murmur, rub   or gallop  Breast Exam:    Deferred to GYN  Abdomen:     Soft, non-tender, bowel sounds active all four quadrants,    no masses, no organomegaly  Genitalia:    Deferred to GYN  Rectal:    Extremities:   Extremities normal, atraumatic, no cyanosis or edema  Pulses:   2+ and symmetric all  extremities  Skin:   Skin color, texture, turgor normal, no rashes or lesions  Lymph nodes:   Cervical, supraclavicular, and axillary nodes normal  Neurologic:   CNII-XII intact, normal strength, sensation and reflexes    throughout          Assessment & Plan:

## 2018-11-22 NOTE — Assessment & Plan Note (Signed)
Chronic problem, following w/ Dr Silverio Decamp (GI)  On Remicade

## 2018-11-22 NOTE — Assessment & Plan Note (Signed)
Pt has hx of this.  Check labs and replete prn. 

## 2018-11-22 NOTE — Patient Instructions (Signed)
Follow up in 1 yr or as needed We'll notify you of your lab results and make any changes if needed Keep up the good work on healthy diet and regular exercise- you look great!!! Call with any questions or concerns! Stay Safe!

## 2018-11-23 ENCOUNTER — Other Ambulatory Visit: Payer: Self-pay | Admitting: General Practice

## 2018-11-23 LAB — HEPATIC FUNCTION PANEL
ALT: 17 U/L (ref 0–35)
AST: 16 U/L (ref 0–37)
Albumin: 3.7 g/dL (ref 3.5–5.2)
Alkaline Phosphatase: 66 U/L (ref 39–117)
Bilirubin, Direct: 0.2 mg/dL (ref 0.0–0.3)
Total Bilirubin: 1 mg/dL (ref 0.2–1.2)
Total Protein: 6.3 g/dL (ref 6.0–8.3)

## 2018-11-23 LAB — VITAMIN D 25 HYDROXY (VIT D DEFICIENCY, FRACTURES): VITD: 14.22 ng/mL — ABNORMAL LOW (ref 30.00–100.00)

## 2018-11-23 LAB — LIPID PANEL
Cholesterol: 148 mg/dL (ref 0–200)
HDL: 68 mg/dL (ref >39.00)
LDL Cholesterol: 42 mg/dL (ref 0–99)
NonHDL: 79.64
Total CHOL/HDL Ratio: 2
Triglycerides: 190 mg/dL — ABNORMAL HIGH (ref 0.0–149.0)
VLDL: 38 mg/dL (ref 0.0–40.0)

## 2018-11-23 LAB — BASIC METABOLIC PANEL
BUN: 12 mg/dL (ref 6–23)
CO2: 31 mEq/L (ref 19–32)
Calcium: 8.9 mg/dL (ref 8.4–10.5)
Chloride: 103 mEq/L (ref 96–112)
Creatinine, Ser: 0.74 mg/dL (ref 0.40–1.20)
GFR: 82.77 mL/min (ref >60.00)
Glucose, Bld: 96 mg/dL (ref 70–99)
Potassium: 4.1 mEq/L (ref 3.5–5.1)
Sodium: 140 mEq/L (ref 135–145)

## 2018-11-23 LAB — CBC WITH DIFFERENTIAL/PLATELET
Basophils Absolute: 0 10*3/uL (ref 0.0–0.1)
Basophils Relative: 0.7 % (ref 0.0–3.0)
Eosinophils Absolute: 0.1 10*3/uL (ref 0.0–0.7)
Eosinophils Relative: 1.4 % (ref 0.0–5.0)
HCT: 40 % (ref 36.0–46.0)
Hemoglobin: 13.2 g/dL (ref 12.0–15.0)
Lymphocytes Relative: 38 % (ref 12.0–46.0)
Lymphs Abs: 2.7 10*3/uL (ref 0.7–4.0)
MCHC: 33.1 g/dL (ref 30.0–36.0)
MCV: 88.4 fl (ref 78.0–100.0)
Monocytes Absolute: 0.5 10*3/uL (ref 0.1–1.0)
Monocytes Relative: 7.2 % (ref 3.0–12.0)
Neutro Abs: 3.8 10*3/uL (ref 1.4–7.7)
Neutrophils Relative %: 52.7 % (ref 43.0–77.0)
Platelets: 373 10*3/uL (ref 150.0–400.0)
RBC: 4.53 Mil/uL (ref 3.87–5.11)
RDW: 13.3 % (ref 11.5–15.5)
WBC: 7.2 10*3/uL (ref 4.0–10.5)

## 2018-11-23 LAB — TSH: TSH: 0.92 u[IU]/mL (ref 0.35–4.50)

## 2018-11-23 MED ORDER — VITAMIN D (ERGOCALCIFEROL) 1.25 MG (50000 UNIT) PO CAPS: 50000.0000 [IU] | ORAL_CAPSULE | ORAL | 0 refills | Status: DC

## 2019-03-12 ENCOUNTER — Other Ambulatory Visit: Payer: Self-pay | Admitting: Family Medicine

## 2019-05-28 ENCOUNTER — Ambulatory Visit: Payer: BC Managed Care – PPO | Attending: Internal Medicine

## 2019-05-28 DIAGNOSIS — Z20822 Contact with and (suspected) exposure to covid-19: Secondary | ICD-10-CM

## 2019-05-29 LAB — NOVEL CORONAVIRUS, NAA: SARS-CoV-2, NAA: NOT DETECTED

## 2019-05-31 ENCOUNTER — Other Ambulatory Visit: Payer: Self-pay | Admitting: Family Medicine

## 2019-07-30 ENCOUNTER — Other Ambulatory Visit: Payer: Self-pay | Admitting: Family Medicine

## 2019-07-30 DIAGNOSIS — N6310 Unspecified lump in the right breast, unspecified quadrant: Secondary | ICD-10-CM

## 2019-09-13 ENCOUNTER — Other Ambulatory Visit: Payer: Self-pay | Admitting: Family Medicine

## 2019-09-21 ENCOUNTER — Telehealth: Payer: Self-pay | Admitting: Gastroenterology

## 2019-09-21 MED ORDER — CYANOCOBALAMIN 1000 MCG/ML IJ SOLN: 1000.0000 ug | INTRAMUSCULAR | 6 refills | Status: DC

## 2019-09-21 NOTE — Telephone Encounter (Signed)
Sent B12 to patients pharmacy......Marland Kitchen And left her a message to schedule an appointment   Appointment is 11/28/2019

## 2019-09-21 NOTE — Telephone Encounter (Signed)
Dr Lyndel Safe you are Doc of the Day, This patient is wanting a refill on B12 but has not been seen since 2019 , she has crohns disease can she have this refill? Or does she need an office appointemnt with Dr Silverio Decamp first   Thanks

## 2019-09-21 NOTE — Telephone Encounter (Signed)
DOD  Proceed with B12 1 mg IM q. Monthly, #1, 6 refills Should see Dr. Silverio Decamp -first available appointment-since has not been seen for a while.  RG

## 2019-11-28 ENCOUNTER — Other Ambulatory Visit: Payer: Self-pay | Admitting: General Practice

## 2019-11-28 ENCOUNTER — Ambulatory Visit: Payer: BC Managed Care – PPO | Admitting: Gastroenterology

## 2019-11-28 ENCOUNTER — Encounter: Payer: Self-pay | Admitting: Gastroenterology

## 2019-11-28 ENCOUNTER — Other Ambulatory Visit: Payer: Self-pay

## 2019-11-28 ENCOUNTER — Ambulatory Visit (INDEPENDENT_AMBULATORY_CARE_PROVIDER_SITE_OTHER): Payer: BC Managed Care – PPO | Admitting: Family Medicine

## 2019-11-28 ENCOUNTER — Encounter: Payer: Self-pay | Admitting: Family Medicine

## 2019-11-28 VITALS — BP 121/81 | HR 76 | Temp 97.9°F | Resp 16 | Ht 63.0 in | Wt 113.5 lb

## 2019-11-28 VITALS — BP 132/72 | HR 66 | Ht 63.0 in | Wt 113.2 lb

## 2019-11-28 DIAGNOSIS — Z0000 Encounter for general adult medical examination without abnormal findings: Secondary | ICD-10-CM | POA: Diagnosis not present

## 2019-11-28 DIAGNOSIS — K50119 Crohn's disease of large intestine with unspecified complications: Secondary | ICD-10-CM

## 2019-11-28 DIAGNOSIS — Z23 Encounter for immunization: Secondary | ICD-10-CM | POA: Diagnosis not present

## 2019-11-28 DIAGNOSIS — E559 Vitamin D deficiency, unspecified: Secondary | ICD-10-CM

## 2019-11-28 DIAGNOSIS — K50812 Crohn's disease of both small and large intestine with intestinal obstruction: Secondary | ICD-10-CM | POA: Diagnosis not present

## 2019-11-28 DIAGNOSIS — R109 Unspecified abdominal pain: Secondary | ICD-10-CM

## 2019-11-28 LAB — LIPID PANEL
Cholesterol: 163 mg/dL (ref 0–200)
HDL: 71.5 mg/dL (ref >39.00)
LDL Cholesterol: 57 mg/dL (ref 0–99)
NonHDL: 91.18
Total CHOL/HDL Ratio: 2
Triglycerides: 171 mg/dL — ABNORMAL HIGH (ref 0.0–149.0)
VLDL: 34.2 mg/dL (ref 0.0–40.0)

## 2019-11-28 LAB — CBC WITH DIFFERENTIAL/PLATELET
Basophils Absolute: 0 10*3/uL (ref 0.0–0.1)
Basophils Relative: 0.5 % (ref 0.0–3.0)
Eosinophils Absolute: 0.1 10*3/uL (ref 0.0–0.7)
Eosinophils Relative: 1.7 % (ref 0.0–5.0)
HCT: 42.3 % (ref 36.0–46.0)
Hemoglobin: 14 g/dL (ref 12.0–15.0)
Lymphocytes Relative: 47.7 % — ABNORMAL HIGH (ref 12.0–46.0)
Lymphs Abs: 3.6 10*3/uL (ref 0.7–4.0)
MCHC: 33.2 g/dL (ref 30.0–36.0)
MCV: 86.7 fl (ref 78.0–100.0)
Monocytes Absolute: 0.7 10*3/uL (ref 0.1–1.0)
Monocytes Relative: 9.5 % (ref 3.0–12.0)
Neutro Abs: 3.1 10*3/uL (ref 1.4–7.7)
Neutrophils Relative %: 40.6 % — ABNORMAL LOW (ref 43.0–77.0)
Platelets: 346 10*3/uL (ref 150.0–400.0)
RBC: 4.87 Mil/uL (ref 3.87–5.11)
RDW: 13.5 % (ref 11.5–15.5)
WBC: 7.6 10*3/uL (ref 4.0–10.5)

## 2019-11-28 LAB — BASIC METABOLIC PANEL
BUN: 8 mg/dL (ref 6–23)
CO2: 29 mEq/L (ref 19–32)
Calcium: 9.4 mg/dL (ref 8.4–10.5)
Chloride: 103 mEq/L (ref 96–112)
Creatinine, Ser: 0.65 mg/dL (ref 0.40–1.20)
GFR: 95.74 mL/min (ref >60.00)
Glucose, Bld: 87 mg/dL (ref 70–99)
Potassium: 4 mEq/L (ref 3.5–5.1)
Sodium: 140 mEq/L (ref 135–145)

## 2019-11-28 LAB — HEPATIC FUNCTION PANEL
ALT: 14 U/L (ref 0–35)
AST: 17 U/L (ref 0–37)
Albumin: 4 g/dL (ref 3.5–5.2)
Alkaline Phosphatase: 84 U/L (ref 39–117)
Bilirubin, Direct: 0.2 mg/dL (ref 0.0–0.3)
Total Bilirubin: 1.8 mg/dL — ABNORMAL HIGH (ref 0.2–1.2)
Total Protein: 6.8 g/dL (ref 6.0–8.3)

## 2019-11-28 LAB — TSH: TSH: 1.62 u[IU]/mL (ref 0.35–4.50)

## 2019-11-28 LAB — VITAMIN D 25 HYDROXY (VIT D DEFICIENCY, FRACTURES): VITD: 17.84 ng/mL — ABNORMAL LOW (ref 30.00–100.00)

## 2019-11-28 MED ORDER — VITAMIN D (ERGOCALCIFEROL) 1.25 MG (50000 UNIT) PO CAPS: 50000.0000 [IU] | ORAL_CAPSULE | ORAL | 0 refills | Status: DC

## 2019-11-28 MED ORDER — HYOSCYAMINE SULFATE 0.125 MG SL SUBL
SUBLINGUAL_TABLET | SUBLINGUAL | 1 refills | Status: DC
Start: 2019-11-28 — End: 2020-06-24

## 2019-11-28 NOTE — Progress Notes (Signed)
   Subjective:    Patient ID: Karen Knox, female    DOB: 1965-12-22, 54 y.o.   MRN: [id number]  HPI CPE- UTD on pap, needs to schedule mammo.  Has GI appt upcoming.  UTD on Tdap, Pneumovax, COVID vaccines.  Will get flu today.  Reviewed past medical, surgical, family and social histories.   Patient Care Team    Relationship Specialty Notifications Start End  Midge Minium, MD PCP - General Family Medicine  02/14/18   Hennie Duos, MD Consulting Physician Rheumatology  02/14/18   Linda Hedges, Kim Physician Obstetrics and Gynecology  02/14/18   Mauri Pole, MD Consulting Physician Gastroenterology  02/14/18       Review of Systems Patient reports no vision/ hearing changes, adenopathy,fever, weight change,  persistant/recurrent hoarseness , swallowing issues, chest pain, palpitations, edema, persistant/recurrent cough, hemoptysis, dyspnea (rest/exertional/paroxysmal nocturnal), gastrointestinal bleeding (melena, rectal bleeding), abdominal pain, significant heartburn, bowel changes, GU symptoms (dysuria, hematuria, incontinence), Gyn symptoms (abnormal  bleeding, pain),  syncope, focal weakness, memory loss, numbness & tingling, skin/hair/nail changes, abnormal bruising or bleeding, anxiety, or depression.     Objective:   Physical Exam General Appearance:    Alert, cooperative, no distress, appears stated age  Head:    Normocephalic, without obvious abnormality, atraumatic  Eyes:    PERRL, conjunctiva/corneas clear, EOM's intact, fundi    benign, both eyes  Ears:    Normal TM's and external ear canals, both ears  Nose:   Deferred due to COVID  Throat:   Neck:   Supple, symmetrical, trachea midline, no adenopathy;    Thyroid: no enlargement/tenderness/nodules  Back:     Symmetric, no curvature, ROM normal, no CVA tenderness  Lungs:     Clear to auscultation bilaterally, respirations unlabored  Chest Wall:    No tenderness or deformity   Heart:     Regular rate and rhythm, S1 and S2 normal, no murmur, rub   or gallop  Breast Exam:    Deferred to GYN  Abdomen:     Soft, non-tender, bowel sounds active all four quadrants,    no masses, no organomegaly  Genitalia:    Deferred to GYN  Rectal:    Extremities:   Extremities normal, atraumatic, no cyanosis or edema  Pulses:   2+ and symmetric all extremities  Skin:   Skin color, texture, turgor normal, no rashes or lesions  Lymph nodes:   Cervical, supraclavicular, and axillary nodes normal  Neurologic:   CNII-XII intact, normal strength, sensation and reflexes    throughout          Assessment & Plan:

## 2019-11-28 NOTE — Progress Notes (Signed)
Karen Knox    [id number]    1965-09-14  Primary Care Physician:Tabori, Aundra Millet, MD  Referring Physician: Midge Minium, MD [address] [place],  [place] [address]   Chief complaint:  Crohn's disease  HPI:  54 year old very pleasant female with longstanding history of Crohn's ileitis, s/p terminal ileal resection with ileocolonic anastomosis  She is having intermittent episodes of abdominal bloating and right lower quadrant discomfort, she is not able to identify any particular triggers but she had an episode of right before she was due for Remicade infusion  She is currently getting Remicade infusions every 8 weeks  Colonoscopy July 2018 showed terminal ileal stricture with erosions and friable tissue, biopsies showed evidence of active ileitis.    After colonoscopy she was treated with Entocort for 30 days with improvement of symptoms.  October 07, 2016: Infliximab  drug trough of 21 with undetectable antibody.  She was getting infliximab infusions every 6 weeks  Repeat on April 19, 2017 :infliximab drug trough was 11 with undetectable antibodies   Denies any nausea, vomiting, change in bowel habits, decreased appetite, weight loss, melena or bright red blood per rectum   Outpatient Encounter Medications as of 11/28/2019  Medication Sig  . cyanocobalamin (,VITAMIN B-12,) 1000 MCG/ML injection Inject 1 mL (1,000 mcg total) into the muscle every 30 (thirty) days.  . Ferrous Sulfate (IRON PO) Take 1 tablet by mouth daily as needed.   . inFLIXimab (REMICADE) 100 MG injection Inject 500 mg into the vein every 6 (six) weeks. (Patient taking differently: Inject 10 mg/kg into the vein every 8 (eight) weeks. )   No facility-administered encounter medications on file as of 11/28/2019.    Allergies as of 11/28/2019  . (No Known Allergies)    Past Medical History:  Diagnosis Date  . Anal fissure   . Arthritis    ? IBD related?  . B12 deficiency     . Crohn's disease ([place])   . External hemorrhoids   . Gilbert's syndrome   . Internal hemorrhoids   . Iron deficiency anemia   . Small bowel stricture ([place])    multiple 07/05/00  . TB (tuberculosis)    "latent"- was treated because pt was on Remicade; October 2016    Past Surgical History:  Procedure Laterality Date  . APPENDECTOMY     with initial colon resection including terminal ileum  . CHOLECYSTECTOMY    . [place], 2002   x2  . COLONOSCOPY    . EXPLORATORY LAPAROTOMY  2002   with extensive lysis of adhesions, resection of ileocolonic anastomosis,resection segment small bowel; anastomosis of small bowel  to small bowel; ileocolic anastomosis  . UPPER GASTROINTESTINAL ENDOSCOPY      Family History  Problem Relation Age of Onset  . Arthritis Father        and on mothers side  . Multiple sclerosis Father   . Colon cancer Father 2  . Melanoma Mother   . Breast cancer Mother 67  . Esophageal cancer Neg Hx   . Rectal cancer Neg Hx   . Stomach cancer Neg Hx     Social History   Socioeconomic History  . Marital status: Legally Separated    Spouse name: Not on file  . Number of children: 2  . Years of education: Not on file  . Highest education level: Not on file  Occupational History  . Occupation: [employer]   Tobacco Use  . Smoking status: Never Smoker  . Smokeless tobacco: Never Used  Vaping Use  . Vaping Use: Never used  Substance and Sexual Activity  . Alcohol use: Yes    Comment: occasional  . Drug use: No  . Sexual activity: Yes    Birth control/protection: Other-see comments  Other Topics Concern  . Not on file  Social History Narrative  . Not on file   Social Determinants of Health   Financial Resource Strain:   . Difficulty of Paying Living Expenses: Not on file  Food Insecurity:   . Worried About Charity fundraiser in the Last Year: Not on file  . Ran Out of Food in the Last Year: Not on file  Transportation Needs:   .  Lack of Transportation (Medical): Not on file  . Lack of Transportation (Non-Medical): Not on file  Physical Activity:   . Days of Exercise per Week: Not on file  . Minutes of Exercise per Session: Not on file  Stress:   . Feeling of Stress : Not on file  Social Connections:   . Frequency of Communication with Friends and Family: Not on file  . Frequency of Social Gatherings with Friends and Family: Not on file  . Attends Religious Services: Not on file  . Active Member of Clubs or Organizations: Not on file  . Attends Archivist Meetings: Not on file  . Marital Status: Not on file  Intimate Partner Violence:   . Fear of Current or Ex-Partner: Not on file  . Emotionally Abused: Not on file  . Physically Abused: Not on file  . Sexually Abused: Not on file      Review of systems: All other review of systems negative except as mentioned in the HPI.   Physical Exam: Vitals:   11/28/19 0942  BP: 132/72  Pulse: 66  SpO2: 96%   Body mass index is 20.05 kg/m. Gen:      No acute distress HEENT:  sclera anicteric Abd:      soft, non-tender; no palpable masses, no distension Ext:    No edema Neuro: alert and oriented x 3 Psych: normal mood and affect  Data Reviewed:  Reviewed labs, radiology imaging, old records and pertinent past GI work up   Assessment and Plan/Recommendations:  54 year old very pleasant female with history of Crohn's ileitis s/p terminal ileal resection with ileocolonic anastomosis Evidence of persistent inflammation in the neoterminal ileum and ileocolonic anastomosis based on last colonoscopy in 2018  Currently on Remicade infusions 10 mg/kg every 8 weeks  She is having intermittent episodes concerning for possible Crohn's flare versus partial SBO due to fibrostenotic lesion at ileocolonic anastomosis   Trial of hyoscyamine sublingual 1 tablet up to 3 times daily as needed for abdominal cramping Continue with low residue diet prevent any  episodes of small bowel obstruction  Check CBC, CMP, vitamin D, TB QuantiFERON gold, CRP B12, folate and iron panel for IBD health maintenance  Check infliximab drug trough and antibody level  We will plan for colonoscopy or imaging for further evaluation based on labs  Return in 3 to 4 months or sooner if needed  The patient was provided an opportunity to ask questions and all were answered. The patient agreed with the plan and demonstrated an understanding of the instructions.  Damaris Hippo , MD    CC: Midge Minium, MD

## 2019-11-28 NOTE — Assessment & Plan Note (Signed)
Check labs.  Replete prn. 

## 2019-11-28 NOTE — Assessment & Plan Note (Signed)
Pt's PE WNL.  UTD on pneumonia vaccine, Tdap.  Flu shot given today.  UTD on pap, due for mammo- pt to call.  Check labs.  Anticipatory guidance provided.

## 2019-11-28 NOTE — Patient Instructions (Addendum)
We will send Hyoscyamine to your pharmacy  Go to the basement for labs on September 16 th, orders will already be in the system for you to have drawn  Follow up in 3-4 months  I appreciate the  opportunity to care for you  Thank You   Harl Bowie , MD

## 2019-11-28 NOTE — Assessment & Plan Note (Signed)
Following w/ GI.

## 2019-11-28 NOTE — Patient Instructions (Signed)
Follow up in 1 year or as needed We'll notify you of your lab results and make any changes if needed Try and restart your exercise- you can do it! Give yourself some grace!  This is hard! Call and schedule your mammogram Call with any questions or concerns Stay Safe!  Stay Healthy!

## 2019-11-30 ENCOUNTER — Encounter: Payer: Self-pay | Admitting: Gastroenterology

## 2019-12-05 DIAGNOSIS — K589 Irritable bowel syndrome without diarrhea: Secondary | ICD-10-CM | POA: Insufficient documentation

## 2019-12-05 DIAGNOSIS — D649 Anemia, unspecified: Secondary | ICD-10-CM | POA: Insufficient documentation

## 2019-12-06 ENCOUNTER — Other Ambulatory Visit (INDEPENDENT_AMBULATORY_CARE_PROVIDER_SITE_OTHER): Payer: BC Managed Care – PPO

## 2019-12-06 DIAGNOSIS — K50812 Crohn's disease of both small and large intestine with intestinal obstruction: Secondary | ICD-10-CM | POA: Diagnosis not present

## 2019-12-06 DIAGNOSIS — R109 Unspecified abdominal pain: Secondary | ICD-10-CM | POA: Diagnosis not present

## 2019-12-06 LAB — COMPREHENSIVE METABOLIC PANEL
ALT: 15 U/L (ref 0–35)
AST: 15 U/L (ref 0–37)
Albumin: 3.9 g/dL (ref 3.5–5.2)
Alkaline Phosphatase: 74 U/L (ref 39–117)
BUN: 12 mg/dL (ref 6–23)
CO2: 32 mEq/L (ref 19–32)
Calcium: 9 mg/dL (ref 8.4–10.5)
Chloride: 101 mEq/L (ref 96–112)
Creatinine, Ser: 0.66 mg/dL (ref 0.40–1.20)
GFR: 94.06 mL/min (ref >60.00)
Glucose, Bld: 110 mg/dL — ABNORMAL HIGH (ref 70–99)
Potassium: 4 mEq/L (ref 3.5–5.1)
Sodium: 138 mEq/L (ref 135–145)
Total Bilirubin: 1.4 mg/dL — ABNORMAL HIGH (ref 0.2–1.2)
Total Protein: 6.9 g/dL (ref 6.0–8.3)

## 2019-12-06 LAB — CBC WITH DIFFERENTIAL/PLATELET
Basophils Absolute: 0.1 10*3/uL (ref 0.0–0.1)
Basophils Relative: 1 % (ref 0.0–3.0)
Eosinophils Absolute: 0.2 10*3/uL (ref 0.0–0.7)
Eosinophils Relative: 1.9 % (ref 0.0–5.0)
HCT: 41.3 % (ref 36.0–46.0)
Hemoglobin: 13.6 g/dL (ref 12.0–15.0)
Lymphocytes Relative: 40.9 % (ref 12.0–46.0)
Lymphs Abs: 3.3 10*3/uL (ref 0.7–4.0)
MCHC: 32.9 g/dL (ref 30.0–36.0)
MCV: 87 fl (ref 78.0–100.0)
Monocytes Absolute: 0.7 10*3/uL (ref 0.1–1.0)
Monocytes Relative: 8.4 % (ref 3.0–12.0)
Neutro Abs: 3.8 10*3/uL (ref 1.4–7.7)
Neutrophils Relative %: 47.8 % (ref 43.0–77.0)
Platelets: 286 10*3/uL (ref 150.0–400.0)
RBC: 4.75 Mil/uL (ref 3.87–5.11)
RDW: 13.7 % (ref 11.5–15.5)
WBC: 8 10*3/uL (ref 4.0–10.5)

## 2019-12-06 LAB — VITAMIN D 25 HYDROXY (VIT D DEFICIENCY, FRACTURES): VITD: 30.06 ng/mL (ref 30.00–100.00)

## 2019-12-06 LAB — FOLATE: Folate: 19 ng/mL (ref >5.9)

## 2019-12-06 LAB — IBC PANEL
Iron: 54 ug/dL (ref 42–145)
Saturation Ratios: 9.5 % — ABNORMAL LOW (ref 20.0–50.0)
Transferrin: 406 mg/dL — ABNORMAL HIGH (ref 212.0–360.0)

## 2019-12-06 LAB — HIGH SENSITIVITY CRP: CRP, High Sensitivity: 0.76 mg/L (ref 0.000–5.000)

## 2019-12-06 LAB — VITAMIN B12: Vitamin B-12: 303 pg/mL (ref 211–911)

## 2019-12-07 ENCOUNTER — Telehealth: Payer: Self-pay | Admitting: Gastroenterology

## 2019-12-07 NOTE — Telephone Encounter (Signed)
She has a drug level pending. [facility] does the infusions. I have sent the request to Dr Silverio Decamp . The patient would like to have the infusion 2 days early. Infusion center needs an order if this is approved byt the provider. See the telephone message.

## 2019-12-08 LAB — QUANTIFERON-TB GOLD PLUS
Mitogen-NIL: >10 IU/mL
NIL: 1.2 IU/mL
QuantiFERON-TB Gold Plus: POSITIVE — AB
TB1-NIL: 0.64 IU/mL
TB2-NIL: 0.05 IU/mL

## 2019-12-14 LAB — SERIAL MONITORING

## 2019-12-14 LAB — INFLIXIMAB+AB (SERIAL MONITOR)
Anti-Infliximab Antibody: <22 ng/mL
Infliximab Drug Level: 12 ug/mL

## 2020-02-12 ENCOUNTER — Other Ambulatory Visit: Payer: BC Managed Care – PPO

## 2020-02-13 ENCOUNTER — Other Ambulatory Visit: Payer: Self-pay | Admitting: Family Medicine

## 2020-02-20 ENCOUNTER — Encounter: Payer: Self-pay | Admitting: Gastroenterology

## 2020-02-20 ENCOUNTER — Ambulatory Visit: Payer: BC Managed Care – PPO | Admitting: Gastroenterology

## 2020-02-20 VITALS — BP 113/64 | HR 67 | Ht 63.0 in | Wt 116.0 lb

## 2020-02-20 DIAGNOSIS — K50812 Crohn's disease of both small and large intestine with intestinal obstruction: Secondary | ICD-10-CM | POA: Diagnosis not present

## 2020-02-20 DIAGNOSIS — R1031 Right lower quadrant pain: Secondary | ICD-10-CM

## 2020-02-20 DIAGNOSIS — R140 Abdominal distension (gaseous): Secondary | ICD-10-CM

## 2020-02-20 NOTE — Progress Notes (Signed)
Karen Knox    [id number]    November 03, 1965  Primary Care Physician:Tabori, Aundra Millet, MD  Referring Physician: Midge Minium, MD [address] [place],  [place] [address]   Chief complaint:  Crohn's disease  HPI:  54 year old very pleasant female with longstanding history of Crohn's ileitis, s/p terminal ileal resection with ileocolonic anastomosis  She is having intermittent episodes of abdominal bloating and right lower quadrant discomfort, usually once every 4 to 6 weeks.  It lasts for 2 days, she feels her system shuts down, she stops eating and then it slowly improves. She is currently getting Remicade infusions every 8 weeks. Most recent Remicade drug trough within therapeutic range with no detectable antibody level in September 2021 Denies any nausea, vomiting, change in bowel habits, decreased appetite, weight loss, melena or bright red blood per rectum. She completed weekly B12 injections for a month and is getting maintenance injections every 3 weeks approximately  GI history Colonoscopy July 2018 showed terminal ileal stricture with erosions and friable tissue, biopsies showed evidence of active ileitis.   After colonoscopy she was treated with Entocort for 30 days with improvement of symptoms.  October 07, 2016: Infliximab  drug trough of 21 with undetectable antibody.  She was getting infliximab infusions every 6 weeks  Repeat on April 19, 2017 :infliximab drug trough was 11 with undetectable antibodies      Outpatient Encounter Medications as of 02/20/2020  Medication Sig  . cyanocobalamin (,VITAMIN B-12,) 1000 MCG/ML injection Inject 1 mL (1,000 mcg total) into the muscle every 30 (thirty) days.  . Ferrous Sulfate (IRON PO) Take 1 tablet by mouth daily as needed.   . hyoscyamine (LEVSIN SL) 0.125 MG SL tablet Take 1 three times a day as needed  . inFLIXimab (REMICADE) 100 MG injection Inject 500 mg into the vein every 6 (six)  weeks. (Patient taking differently: Inject 10 mg/kg into the vein every 8 (eight) weeks. )  . Vitamin D, Ergocalciferol, (DRISDOL) 1.25 MG (50000 UNIT) CAPS capsule Take 1 capsule (50,000 Units total) by mouth every 7 (seven) days.   No facility-administered encounter medications on file as of 02/20/2020.    Allergies as of 02/20/2020  . (No Known Allergies)    Past Medical History:  Diagnosis Date  . Anal fissure   . Arthritis    ? IBD related?  . B12 deficiency   . Crohn's disease ([place])   . External hemorrhoids   . Gilbert's syndrome   . Internal hemorrhoids   . Iron deficiency anemia   . Small bowel stricture ([place])    multiple 07/05/00  . TB (tuberculosis)    "latent"- was treated because pt was on Remicade; October 2016    Past Surgical History:  Procedure Laterality Date  . APPENDECTOMY     with initial colon resection including terminal ileum  . CHOLECYSTECTOMY    . [place], 2002   x2  . COLONOSCOPY    . EXPLORATORY LAPAROTOMY  2002   with extensive lysis of adhesions, resection of ileocolonic anastomosis,resection segment small bowel; anastomosis of small bowel  to small bowel; ileocolic anastomosis  . UPPER GASTROINTESTINAL ENDOSCOPY      Family History  Problem Relation Age of Onset  . Arthritis Father        and on mothers side  . Multiple sclerosis Father   . Colon cancer Father 48  . Melanoma Mother   .  Breast cancer Mother 69  . Esophageal cancer Neg Hx   . Rectal cancer Neg Hx   . Stomach cancer Neg Hx     Social History   Socioeconomic History  . Marital status: Legally Separated    Spouse name: Not on file  . Number of children: 2  . Years of education: Not on file  . Highest education level: Not on file  Occupational History  . Occupation: [employer]   Tobacco Use  . Smoking status: Never Smoker  . Smokeless tobacco: Never Used  Vaping Use  . Vaping Use: Never used  Substance and Sexual Activity  . Alcohol use: Yes      Comment: occasional  . Drug use: No  . Sexual activity: Yes    Birth control/protection: Other-see comments  Other Topics Concern  . Not on file  Social History Narrative  . Not on file   Social Determinants of Health   Financial Resource Strain:   . Difficulty of Paying Living Expenses: Not on file  Food Insecurity:   . Worried About Charity fundraiser in the Last Year: Not on file  . Ran Out of Food in the Last Year: Not on file  Transportation Needs:   . Lack of Transportation (Medical): Not on file  . Lack of Transportation (Non-Medical): Not on file  Physical Activity:   . Days of Exercise per Week: Not on file  . Minutes of Exercise per Session: Not on file  Stress:   . Feeling of Stress : Not on file  Social Connections:   . Frequency of Communication with Friends and Family: Not on file  . Frequency of Social Gatherings with Friends and Family: Not on file  . Attends Religious Services: Not on file  . Active Member of Clubs or Organizations: Not on file  . Attends Archivist Meetings: Not on file  . Marital Status: Not on file  Intimate Partner Violence:   . Fear of Current or Ex-Partner: Not on file  . Emotionally Abused: Not on file  . Physically Abused: Not on file  . Sexually Abused: Not on file      Review of systems: All other review of systems negative except as mentioned in the HPI.   Physical Exam: Vitals:   02/20/20 0815  BP: 113/64  Pulse: 67  SpO2: 99%   Body mass index is 20.55 kg/m. Gen:      No acute distress HEENT:  sclera anicteric Abd:      soft, non-tender; no palpable masses, mild distention in right lower quadrant with tenderness Ext:    No edema Neuro: alert and oriented x 3 Psych: normal mood and affect  Data Reviewed:  Reviewed labs, radiology imaging, old records and pertinent past GI work up   Assessment and Plan/Recommendations: 54 year old very pleasant female with history of Crohn's ileitis s/p  terminal ileal resection with ileocolonic anastomosis  She is having intermittent episodes of possible partial small bowel obstruction that spontaneously resolve with bowel rest.  We will plan to schedule CT enterography to exclude possible Crohn's related fibrostenotic stricture at ileocolonic anastomosis, adhesions or persistent inflammation related to Crohn's   She had evidence of inflammation in the neoterminal ileum and ileocolonic anastomosis based on last colonoscopy in 2018 Currently Remicade drug level is within therapeutic range, continue infusion at 10 mg/kg every 8 weeks  Schedule colonoscopy after CT to assess disease activity and may consider dilation of ileocolonic stricture based on CT findings  Continue B12 injections every 3 weeks, will recheck level at next follow-up visit  She completed Covid vaccination series including booster and also received flu shot  She is up-to-date with IBD health maintenance  Return in 3 months or sooner if needed   This visit required 40 minutes of patient care (this includes precharting, chart review, review of results, face-to-face time used for counseling as well as treatment plan and follow-up. The patient was provided an opportunity to ask questions and all were answered. The patient agreed with the plan and demonstrated an understanding of the instructions.  Damaris Hippo , MD    CC: Midge Minium, MD

## 2020-02-20 NOTE — Patient Instructions (Addendum)
You have been scheduled for a CT/Enterography  scan of the abdomen and pelvis at  [facility] are scheduled for your CT Scan on 03/04/2020 at 12:30pm. You should arrive 1 1/2 hours early  prior to your appointment time for registration. Please follow the written instructions below on the day of your exam:   If you have any questions regarding your exam or if you need to reschedule, you may call the CT department at [phone] between the hours of 8:00 am and 5:00 pm, Monday-Friday.   You have been scheduled for a colonoscopy. Please follow written instructions given to you at your visit today.  Please pick up your prep supplies at the pharmacy within the next 1-3 days. If you use inhalers (even only as needed), please bring them with you on the day of your procedure.  We have given you a PLENVU sample prep kit today   If you are age 43 or older, your body mass index should be between 23-30. Your Body mass index is 20.55 kg/m. If this is out of the aforementioned range listed, please consider follow up with your Primary Care Provider.  If you are age 53 or younger, your body mass index should be between 19-25. Your Body mass index is 20.55 kg/m. If this is out of the aformentioned range listed, please consider follow up with your Primary Care Provider.    Due to recent changes in healthcare laws, you may see the results of your imaging and laboratory studies on MyChart before your provider has had a chance to review them.  We understand that in some cases there may be results that are confusing or concerning to you. Not all laboratory results come back in the same time frame and the provider may be waiting for multiple results in order to interpret others.  Please give [place] 48 hours in order for your provider to thoroughly review all the results before contacting the office for clarification of your results.   I appreciate the  opportunity to care for you  Thank You    Harl Bowie , MD   ________________________________________________________________________

## 2020-03-04 ENCOUNTER — Ambulatory Visit (HOSPITAL_COMMUNITY): Payer: BC Managed Care – PPO

## 2020-04-08 ENCOUNTER — Other Ambulatory Visit: Payer: Self-pay

## 2020-04-08 ENCOUNTER — Encounter (HOSPITAL_COMMUNITY): Payer: Self-pay

## 2020-04-08 ENCOUNTER — Ambulatory Visit (HOSPITAL_COMMUNITY)
Admission: RE | Admit: 2020-04-08 | Discharge: 2020-04-08 | Disposition: A | Discharge status: Home / Self Care | Payer: 59 | Source: Ambulatory Visit | Attending: Gastroenterology | Admitting: Gastroenterology

## 2020-04-08 DIAGNOSIS — R1031 Right lower quadrant pain: Secondary | ICD-10-CM | POA: Diagnosis present

## 2020-04-08 DIAGNOSIS — R140 Abdominal distension (gaseous): Secondary | ICD-10-CM | POA: Diagnosis present

## 2020-04-08 DIAGNOSIS — K50812 Crohn's disease of both small and large intestine with intestinal obstruction: Secondary | ICD-10-CM | POA: Insufficient documentation

## 2020-04-08 MED ORDER — IOHEXOL 300 MG/ML  SOLN
100.0000 mL | Freq: Once | INTRAMUSCULAR | Status: AC | PRN
  Administered 2020-04-08: 100 mL via INTRAVENOUS

## 2020-04-08 MED ORDER — BARIUM SULFATE 0.1 % PO SUSP
ORAL | Status: AC
  Filled 2020-04-08: qty 3

## 2020-04-18 ENCOUNTER — Encounter: Payer: Self-pay | Admitting: Gastroenterology

## 2020-04-18 ENCOUNTER — Ambulatory Visit (AMBULATORY_SURGERY_CENTER): Payer: 59 | Admitting: Gastroenterology

## 2020-04-18 ENCOUNTER — Other Ambulatory Visit: Payer: Self-pay

## 2020-04-18 VITALS — BP 114/72 | HR 57 | Temp 97.5°F | Resp 11 | Ht 63.0 in | Wt 116.0 lb

## 2020-04-18 DIAGNOSIS — K50818 Crohn's disease of both small and large intestine with other complication: Secondary | ICD-10-CM

## 2020-04-18 DIAGNOSIS — K50812 Crohn's disease of both small and large intestine with intestinal obstruction: Secondary | ICD-10-CM

## 2020-04-18 MED ORDER — BUDESONIDE 3 MG PO CPEP: 9.0000 mg | ORAL_CAPSULE | Freq: Every day | ORAL | 3 refills | Status: DC

## 2020-04-18 MED ORDER — SODIUM CHLORIDE 0.9 % IV SOLN: 500.0000 mL | INTRAVENOUS | Status: DC

## 2020-04-18 NOTE — Patient Instructions (Signed)
Impression/Recommendations:  Resume previous diet. Continue present medications. Await pathology results.  Repeat colonoscopy in 3-5 years for surveillance.  Date to be determined after pathology results reviewed.  Return to GI clinic at next available appointment.  Use Entocort EC (budesonide) 9 mg. By mouth one time per day for 3 months.  YOU HAD AN ENDOSCOPIC PROCEDURE TODAY AT THE [facility]:   Refer to the procedure report that was given to you for any specific questions about what was found during the examination.  If the procedure report does not answer your questions, please call your gastroenterologist to clarify.  If you requested that your care partner not be given the details of your procedure findings, then the procedure report has been included in a sealed envelope for you to review at your convenience later.  YOU SHOULD EXPECT: Some feelings of bloating in the abdomen. Passage of more gas than usual.  Walking can help get rid of the air that was put into your GI tract during the procedure and reduce the bloating. If you had a lower endoscopy (such as a colonoscopy or flexible sigmoidoscopy) you may notice spotting of blood in your stool or on the toilet paper. If you underwent a bowel prep for your procedure, you may not have a normal bowel movement for a few days.  Please Note:  You might notice some irritation and congestion in your nose or some drainage.  This is from the oxygen used during your procedure.  There is no need for concern and it should clear up in a day or so.  SYMPTOMS TO REPORT IMMEDIATELY:   Following lower endoscopy (colonoscopy or flexible sigmoidoscopy):  Excessive amounts of blood in the stool  Significant tenderness or worsening of abdominal pains  Swelling of the abdomen that is new, acute  Fever of 100F or higher  For urgent or emergent issues, a gastroenterologist can be reached at any hour by calling [phone]. Do not use  MyChart messaging for urgent concerns.    DIET:  We do recommend a small meal at first, but then you may proceed to your regular diet.  Drink plenty of fluids but you should avoid alcoholic beverages for 24 hours.  ACTIVITY:  You should plan to take it easy for the rest of today and you should NOT DRIVE or use heavy machinery until tomorrow (because of the sedation medicines used during the test).    FOLLOW UP: Our staff will call the number listed on your records 48-72 hours following your procedure to check on you and address any questions or concerns that you may have regarding the information given to you following your procedure. If we do not reach you, we will leave a message.  We will attempt to reach you two times.  During this call, we will ask if you have developed any symptoms of COVID 19. If you develop any symptoms (ie: fever, flu-like symptoms, shortness of breath, cough etc.) before then, please call [phone].  If you test positive for Covid 19 in the 2 weeks post procedure, please call and report this information to [place].    If any biopsies were taken you will be contacted by phone or by letter within the next 1-3 weeks.  Please call us at [phone] if you have not heard about the biopsies in 3 weeks.    SIGNATURES/CONFIDENTIALITY: You and/or your care partner have signed paperwork which will be entered into your electronic medical record.  These signatures attest  to the fact that that the information above on your After Visit Summary has been reviewed and is understood.  Full responsibility of the confidentiality of this discharge information lies with you and/or your care-partner. 

## 2020-04-18 NOTE — Op Note (Signed)
[facility] Patient Name: Karen Knox Procedure Date: 04/18/2020 9:41 AM MRN: [id number] Endoscopist: Mauri Pole , MD Age: 55 Referring MD:  Date of Birth: [birth year] Gender: Female Account #: [id number] Procedure:                Colonoscopy Indications:              Evaluation of abnormal imaging study (likely to be                            clinically significant), Abdominal pain in the                            right lower quadrant Medicines:                Monitored Anesthesia Care Procedure:                Pre-Anesthesia Assessment:                           - Prior to the procedure, a History and Physical                            was performed, and patient medications and                            allergies were reviewed. The patient's tolerance of                            previous anesthesia was also reviewed. The risks                            and benefits of the procedure and the sedation                            options and risks were discussed with the patient.                            All questions were answered, and informed consent                            was obtained. Prior Anticoagulants: The patient has                            taken no previous anticoagulant or antiplatelet                            agents. ASA Grade Assessment: II - A patient with                            mild systemic disease. After reviewing the risks                            and benefits, the patient was deemed in  satisfactory condition to undergo the procedure.                           After obtaining informed consent, the colonoscope                            was passed under direct vision. Throughout the                            procedure, the patient's blood pressure, pulse, and                            oxygen saturations were monitored continuously. The                            Olympus PFC-[id number] (#[id number])  Colonoscope was                            introduced through the anus and advanced to the the                            ileocolonic anastomosis. The colonoscopy was                            performed without difficulty. The patient tolerated                            the procedure well. The quality of the bowel                            preparation was excellent. The ileocecal valve,                            appendiceal orifice, and rectum were photographed. Scope In: 9:59:12 AM Scope Out: 10:13:32 AM Scope Withdrawal Time: 0 hours 10 minutes 17 seconds  Total Procedure Duration: 0 hours 14 minutes 20 seconds  Findings:                 The perianal and digital rectal examinations were                            normal.                           There was evidence of a prior end-to-side                            ileo-colonic anastomosis at the ileocecal valve.                            This was patent and was characterized by                            ulceration. The anastomosis was traversed.  The terminal ileum contained a few five mm ulcers.                            No bleeding was present. Biopsies were taken with a                            cold forceps for histology.                           Normal mucosa was found in the entire colon.                           The exam was otherwise without abnormality. Complications:            No immediate complications. Estimated Blood Loss:     Estimated blood loss was minimal. Impression:               - Patent end-to-side ileo-colonic anastomosis,                            characterized by ulceration.                           - A few ulcers in the terminal ileum. Biopsied.                           - Normal mucosa in the entire examined colon.                           - The examination was otherwise normal. Recommendation:           - Patient has a contact number available for                             emergencies. The signs and symptoms of potential                            delayed complications were discussed with the                            patient. Return to normal activities tomorrow.                            Written discharge instructions were provided to the                            patient.                           - Resume previous diet.                           - Continue present medications.                           - Await pathology results.                           -  Repeat colonoscopy in 3 - 5 years for                            surveillance based on pathology results.                           - Return to GI clinic at the next available                            appointment.                           - Use Entocort EC (budesonide) 9 mg PO one time per                            day for 3 months. Mauri Pole, MD 04/18/2020 10:23:16 AM This report has been signed electronically.

## 2020-04-18 NOTE — Progress Notes (Signed)
Called to room to assist during endoscopic procedure.  Patient ID and intended procedure confirmed with present staff. Received instructions for my participation in the procedure from the performing physician.  

## 2020-04-18 NOTE — Progress Notes (Signed)
PT taken to PACU. Monitors in place. VSS. Report given to RN. 

## 2020-04-18 NOTE — Progress Notes (Signed)
Pt. Held in recovery area until pt. Alert and Dr. Silverio Decamp could speak to her.

## 2020-04-22 ENCOUNTER — Telehealth: Payer: Self-pay | Admitting: *Deleted

## 2020-04-22 NOTE — Telephone Encounter (Signed)
Follow up call made. 

## 2020-04-22 NOTE — Telephone Encounter (Signed)
  Follow up Call-  Call back number 04/18/2020  Post procedure Call Back phone  # [phone]  Permission to leave phone message Yes  Some recent data might be hidden     Patient questions:  Do you have a fever, pain , or abdominal swelling? No. Pain Score  0 *  Have you tolerated food without any problems? Yes.    Have you been able to return to your normal activities? Yes.    Do you have any questions about your discharge instructions: Diet   No. Medications  No. Follow up visit  No.  Do you have questions or concerns about your Care? No.  Actions: * If pain score is 4 or above: No action needed, pain <4.  1. Have you developed a fever since your procedure? no  2.   Have you had an respiratory symptoms (SOB or cough) since your procedure? no  3.   Have you tested positive for COVID 19 since your procedure no  4.   Have you had any family members/close contacts diagnosed with the COVID 19 since your procedure?  no   If yes to any of these questions please route to Joylene John, RN and Joella Prince, RN

## 2020-05-06 ENCOUNTER — Encounter: Payer: Self-pay | Admitting: Gastroenterology

## 2020-05-07 ENCOUNTER — Encounter: Payer: Self-pay | Admitting: Gastroenterology

## 2020-05-07 ENCOUNTER — Other Ambulatory Visit: Payer: Self-pay

## 2020-05-07 ENCOUNTER — Ambulatory Visit: Payer: BC Managed Care – PPO | Admitting: Gastroenterology

## 2020-05-07 VITALS — BP 130/90 | HR 64 | Ht 63.0 in | Wt 115.0 lb

## 2020-05-07 DIAGNOSIS — K50018 Crohn's disease of small intestine with other complication: Secondary | ICD-10-CM

## 2020-05-07 MED ORDER — PREDNISONE 10 MG PO TABS: 20.0000 mg | ORAL_TABLET | Freq: Two times a day (BID) | ORAL | 0 refills | Status: DC

## 2020-05-07 MED ORDER — MERCAPTOPURINE 50 MG PO TABS: 50.0000 mg | ORAL_TABLET | Freq: Every day | ORAL | 3 refills | Status: DC

## 2020-05-07 NOTE — Progress Notes (Signed)
Karen Knox    [id number]    08-23-1965  Primary Care Physician:Tabori, Aundra Millet, MD  Referring Physician: Midge Minium, MD [address] [place] [address] [place],  [place] [address]   Chief complaint:  Crohn's disease  HPI:  55 year old very pleasant female with longstanding history of Crohn's ileitis, s/p terminal ileal resection with ileocolonic anastomosis here for follow-up visit for acute flare of Crohn's disease  Colonoscopy April 18, 2020: - Patent end-to-side ileo-colonic anastomosis, characterized by ulceration. - A few ulcers in the terminal ileum. Biopsied. - Normal mucosa in the entire examined colon. - The examination was otherwise normal.  Surgical [P], small bowel, terminal ileum ulcers - SEVERELY ACTIVE CHRONIC ILEITIS WITH ULCERATION, CONSISTENT WITH PATIENT'S CLINICAL HISTORY OF CROHN'S DISEASE - NEGATIVE FOR GRANULOMAS OR DYSPLASIA - VIRAL CYTOPATHIC CHANGES ARE NOT SEENSurgical [P], small bowel, terminal ileum ulcers - SEVERELY ACTIVE CHRONIC ILEITIS WITH ULCERATION, CONSISTENT WITH PATIENT'S CLINICAL HISTORY OF CROHN'S DISEASE - NEGATIVE FOR GRANULOMAS OR DYSPLASIA - VIRAL CYTOPATHIC CHANGES ARE NOT SEEN  No significant improvement of her symptoms with budesonide, she continues to have intermittent episodes of abdominal bloating and right lower quadrant discomfort  She is on Remicade infusions every 8 weeks. Most recent Remicade drug trough within therapeutic range, 12 with no detectable antibody level in September 2021 Denies any nausea, vomiting,change in bowel habits, decreased appetite, weight loss, melena or bright red blood per rectum. B12 injections every 3 weeks approximately   GI history Colonoscopy July 2018 showed terminal ileal stricture with erosions and friable tissue, biopsies showed evidence of active ileitis.  After colonoscopy she was treated with Entocort for 30 days with improvement of symptoms.  October 07, 2016:Infliximab drug trough of 21 with undetectable antibody. She was getting infliximab infusions every 6 weeks  [place] April 19, 2017:infliximab drug trough was 11 with undetectable antibodies  Colonoscopy 04/18/20: - Patent end-to-side ileo-colonic anastomosis, characterized by ulceration. - A few ulcers in the terminal ileum. Biopsied. - Normal mucosa in the entire examined colon. - The examination was otherwise normal.  Surgical [P], small bowel, terminal ileum ulcers - SEVERELY ACTIVE CHRONIC ILEITIS WITH ULCERATION, CONSISTENT WITH PATIENT'S CLINICAL HISTORY OF CROHN'S DISEASE - NEGATIVE FOR GRANULOMAS OR DYSPLASIA - VIRAL CYTOPATHIC CHANGES ARE NOT SEEN  Outpatient Encounter Medications as of 05/07/2020  Medication Sig  . budesonide (ENTOCORT EC) 3 MG 24 hr capsule Take 3 capsules (9 mg total) by mouth daily.  . cyanocobalamin (,VITAMIN B-12,) 1000 MCG/ML injection Inject 1 mL (1,000 mcg total) into the muscle every 30 (thirty) days.  . Ferrous Sulfate (IRON PO) Take 1 tablet by mouth daily as needed.  (Patient not taking: Reported on 04/18/2020)  . hyoscyamine (LEVSIN SL) 0.125 MG SL tablet Take 1 three times a day as needed (Patient not taking: Reported on 04/18/2020)  . inFLIXimab (REMICADE) 100 MG injection Inject 500 mg into the vein every 6 (six) weeks. (Patient taking differently: Inject 10 mg/kg into the vein every 8 (eight) weeks.)  . Vitamin D, Ergocalciferol, (DRISDOL) 1.25 MG (50000 UNIT) CAPS capsule Take 1 capsule (50,000 Units total) by mouth every 7 (seven) days. (Patient not taking: Reported on 04/18/2020)   No facility-administered encounter medications on file as of 05/07/2020.    Allergies as of 05/07/2020  . (No Known Allergies)    Past Medical History:  Diagnosis Date  . Anal fissure   . Arthritis    ? IBD related?  Marland Kitchen  B12 deficiency   . Crohn's disease ([place])   . External hemorrhoids   . Gilbert's syndrome   . Internal hemorrhoids   . Iron  deficiency anemia   . Small bowel stricture ([place])    multiple 07/05/00  . TB (tuberculosis)    "latent"- was treated because pt was on Remicade; October 2016    Past Surgical History:  Procedure Laterality Date  . APPENDECTOMY     with initial colon resection including terminal ileum  . CHOLECYSTECTOMY    . [place], 2002   x2  . COLONOSCOPY    . EXPLORATORY LAPAROTOMY  2002   with extensive lysis of adhesions, resection of ileocolonic anastomosis,resection segment small bowel; anastomosis of small bowel  to small bowel; ileocolic anastomosis  . UPPER GASTROINTESTINAL ENDOSCOPY      Family History  Problem Relation Age of Onset  . Arthritis Father        and on mothers side  . Multiple sclerosis Father   . Colon cancer Father 67  . Melanoma Mother   . Breast cancer Mother 69  . Esophageal cancer Neg Hx   . Rectal cancer Neg Hx   . Stomach cancer Neg Hx     Social History   Socioeconomic History  . Marital status: Married    Spouse name: tom  . Number of children: 2  . Years of education: Not on file  . Highest education level: Not on file  Occupational History  . Occupation: [employer]   Tobacco Use  . Smoking status: Never Smoker  . Smokeless tobacco: Never Used  Vaping Use  . Vaping Use: Never used  Substance and Sexual Activity  . Alcohol use: Yes    Comment: occasional  . Drug use: No  . Sexual activity: Yes    Birth control/protection: Other-see comments  Other Topics Concern  . Not on file  Social History Narrative  . Not on file   Social Determinants of Health   Financial Resource Strain: Not on file  Food Insecurity: Not on file  Transportation Needs: Not on file  Physical Activity: Not on file  Stress: Not on file  Social Connections: Not on file  Intimate Partner Violence: Not on file      Review of systems: All other review of systems negative except as mentioned in the HPI.   Physical Exam: Vitals:   05/07/20  1110  BP: 130/90  Pulse: 64   Body mass index is 20.37 kg/m. Gen:      No acute distress HEENT:  sclera anicteric Abd:      soft, non-tender; no palpable masses, no distension Ext:    No edema Neuro: alert and oriented x 3 Psych: normal mood and affect  Data Reviewed:  Reviewed labs, radiology imaging, old records and pertinent past GI work up   Assessment and Plan/Recommendations: 55 year old very pleasant female with Crohn's ileitis s/p terminal ileal resection with ileocolonic anastomosis with acute Crohn's flare On infliximab maintenance therapy every 8 weeks No improvement with budesonide, will start prednisone taper 20 mg twice a day for a week and decrease by 10 mg every week until reaches 5 mg twice daily X1 week.  Stop prednisone and then transition to budesonide 9 mg daily.  We will plan to continue budesonide for 2 to 3 months.  Start low-dose 6-MP 50 mg daily  Follow-up CBC, CMP, ESR and TPMT activity  Return in 4 to 6 weeks or sooner if needed  This visit required 40 minutes of patient care (this includes precharting, chart review, review of results, face-to-face time used for counseling as well as treatment plan and follow-up. The patient was provided an opportunity to ask questions and all were answered. The patient agreed with the plan and demonstrated an understanding of the instructions.  Damaris Hippo , MD    CC: Midge Minium, MD

## 2020-05-07 NOTE — Patient Instructions (Addendum)
Your provider has requested that you go to the basement level for lab work before leaving today. Press "B" on the elevator. The lab is located at the first door on the left as you exit the elevator.  We have sent 6MP to your pharmacy  You will do a prednisone taper as follows:  20 mg twice a day for 1 week 15 mg twice daily for 1 wek 10 mg twice daily for 1 week 5 mg twice daily for 1 week Then 5 mg daily  You will restart Budesonide 9 mg daily after Prednisone taper  Due to recent changes in healthcare laws, you may see the results of your imaging and laboratory studies on MyChart before your provider has had a chance to review them.  We understand that in some cases there may be results that are confusing or concerning to you. Not all laboratory results come back in the same time frame and the provider may be waiting for multiple results in order to interpret others.  Please give [place] 48 hours in order for your provider to thoroughly review all the results before contacting the office for clarification of your results.   I appreciate the  opportunity to care for you  Thank You   Harl Bowie , MD

## 2020-05-20 ENCOUNTER — Encounter: Payer: Self-pay | Admitting: Gastroenterology

## 2020-05-29 ENCOUNTER — Other Ambulatory Visit: Payer: Self-pay | Admitting: Gastroenterology

## 2020-06-24 ENCOUNTER — Ambulatory Visit: Payer: No Typology Code available for payment source | Admitting: Gastroenterology

## 2020-06-24 ENCOUNTER — Encounter: Payer: Self-pay | Admitting: Gastroenterology

## 2020-06-24 ENCOUNTER — Other Ambulatory Visit (INDEPENDENT_AMBULATORY_CARE_PROVIDER_SITE_OTHER): Payer: No Typology Code available for payment source

## 2020-06-24 VITALS — BP 106/74 | HR 65 | Ht 63.0 in | Wt 118.4 lb

## 2020-06-24 DIAGNOSIS — K50812 Crohn's disease of both small and large intestine with intestinal obstruction: Secondary | ICD-10-CM | POA: Diagnosis not present

## 2020-06-24 LAB — CBC WITH DIFFERENTIAL/PLATELET
Basophils Absolute: 0 10*3/uL (ref 0.0–0.1)
Basophils Relative: 0.6 % (ref 0.0–3.0)
Eosinophils Absolute: 0 10*3/uL (ref 0.0–0.7)
Eosinophils Relative: 0.6 % (ref 0.0–5.0)
HCT: 41.6 % (ref 36.0–46.0)
Hemoglobin: 13.6 g/dL (ref 12.0–15.0)
Lymphocytes Relative: 34.3 % (ref 12.0–46.0)
Lymphs Abs: 2.4 10*3/uL (ref 0.7–4.0)
MCHC: 32.7 g/dL (ref 30.0–36.0)
MCV: 86.2 fl (ref 78.0–100.0)
Monocytes Absolute: 0.9 10*3/uL (ref 0.1–1.0)
Monocytes Relative: 12.9 % — ABNORMAL HIGH (ref 3.0–12.0)
Neutro Abs: 3.7 10*3/uL (ref 1.4–7.7)
Neutrophils Relative %: 51.6 % (ref 43.0–77.0)
Platelets: 333 10*3/uL (ref 150.0–400.0)
RBC: 4.82 Mil/uL (ref 3.87–5.11)
RDW: 14.4 % (ref 11.5–15.5)
WBC: 7.1 10*3/uL (ref 4.0–10.5)

## 2020-06-24 LAB — COMPREHENSIVE METABOLIC PANEL
ALT: 13 U/L (ref 0–35)
AST: 11 U/L (ref 0–37)
Albumin: 3.7 g/dL (ref 3.5–5.2)
Alkaline Phosphatase: 62 U/L (ref 39–117)
BUN: 13 mg/dL (ref 6–23)
CO2: 31 mEq/L (ref 19–32)
Calcium: 9 mg/dL (ref 8.4–10.5)
Chloride: 105 mEq/L (ref 96–112)
Creatinine, Ser: 0.58 mg/dL (ref 0.40–1.20)
GFR: 103.99 mL/min (ref >60.00)
Glucose, Bld: 87 mg/dL (ref 70–99)
Potassium: 3.4 mEq/L — ABNORMAL LOW (ref 3.5–5.1)
Sodium: 143 mEq/L (ref 135–145)
Total Bilirubin: 0.9 mg/dL (ref 0.2–1.2)
Total Protein: 6.6 g/dL (ref 6.0–8.3)

## 2020-06-24 LAB — SEDIMENTATION RATE: Sed Rate: 14 mm/hr (ref 0–30)

## 2020-06-24 MED ORDER — MERCAPTOPURINE 50 MG PO TABS: 25.0000 mg | ORAL_TABLET | Freq: Every day | ORAL | 6 refills | Status: DC

## 2020-06-24 NOTE — Progress Notes (Signed)
Karen Knox    [id number]    12-05-1965  Primary Care Physician:Tabori, Aundra Millet, MD  Referring Physician: Midge Minium, MD [address] [place],  [place] [address]   Chief complaint: Crohn's disease  HPI:  55 year old very pleasant female with longstanding history of Crohn's ileitis, s/p terminal ileal resection with ileocolonic anastomosis here for follow-up visit for acute flare of Crohn's disease  She completed prednisone taper and is currently on budesonide Last Remicade infusion in March, she feels her symptoms improved for a few weeks after Remicade infusion but she starts noticing abdominal discomfort and bloating worse 2 to 4 weeks prior to her next infusion Denies any rectal bleeding.  No weight loss but she has not gained any weight either.  Colonoscopy April 18, 2020: - Patent end-to-side ileo-colonic anastomosis, characterized by ulceration. - A few ulcers in the terminal ileum. Biopsied. - Normal mucosa in the entire examined colon. - The examination was otherwise normal.  Surgical [P], small bowel, terminal ileum ulcers - SEVERELY ACTIVE CHRONIC ILEITIS WITH ULCERATION, CONSISTENT WITH PATIENT'S CLINICAL HISTORY OF CROHN'S DISEASE - NEGATIVE FOR GRANULOMAS OR DYSPLASIA - VIRAL CYTOPATHIC CHANGES ARE NOT SEENSurgical [P], small bowel, terminal ileum ulcers - SEVERELY ACTIVE CHRONIC ILEITIS WITH ULCERATION, CONSISTENT WITH PATIENT'S CLINICAL HISTORY OF CROHN'S DISEASE - NEGATIVE FOR GRANULOMAS OR DYSPLASIA - VIRAL CYTOPATHIC CHANGES ARE NOT SEEN  No significant improvement of her symptoms with budesonide, she continues to have intermittent episodes of abdominal bloating and right lower quadrant discomfort  She is on Remicade infusions every 8 weeks. Most recent Remicade drug trough within therapeutic range, 12 with no detectable antibody level in September 2021 Denies any nausea, vomiting,change in bowel habits, decreased  appetite, weight loss, melena or bright red blood per rectum. B12 injections every 3 weeks approximately   GI history Colonoscopy July 2018 showed terminal ileal stricture with erosions and friable tissue, biopsies showed evidence of active ileitis.  After colonoscopy she was treated with Entocort for 30 days with improvement of symptoms.  October 07, 2016:Infliximab drug trough of 21 with undetectable antibody. She was getting infliximab infusions every 6 weeks  [place] April 19, 2017:infliximab drug trough was 11 with undetectable antibodies  Colonoscopy 04/18/20: - Patent end-to-side ileo-colonic anastomosis, characterized by ulceration. - A few ulcers in the terminal ileum. Biopsied. - Normal mucosa in the entire examined colon. - The examination was otherwise normal.  Surgical [P], small bowel, terminal ileum ulcers - SEVERELY ACTIVE CHRONIC ILEITIS WITH ULCERATION, CONSISTENT WITH PATIENT'S CLINICAL HISTORY OF CROHN'S DISEASE - NEGATIVE FOR GRANULOMAS OR DYSPLASIA - VIRAL CYTOPATHIC CHANGES ARE NOT SEEN   Outpatient Encounter Medications as of 06/24/2020  Medication Sig  . budesonide (ENTOCORT EC) 3 MG 24 hr capsule Take 3 capsules (9 mg total) by mouth daily.  . cyanocobalamin (,VITAMIN B-12,) 1000 MCG/ML injection Inject 1 mL (1,000 mcg total) into the muscle every 30 (thirty) days.  . inFLIXimab (REMICADE) 100 MG injection Inject 500 mg into the vein every 6 (six) weeks. (Patient taking differently: Inject 10 mg/kg into the vein every 8 (eight) weeks.)  . [DISCONTINUED] hyoscyamine (LEVSIN SL) 0.125 MG SL tablet Take 1 three times a day as needed  . [DISCONTINUED] mercaptopurine (PURINETHOL) 50 MG tablet Take 1 tablet (50 mg total) by mouth daily. Give on an empty stomach 1 hour before or 2 hours after meals. Caution: Chemotherapy.  . [DISCONTINUED] predniSONE (DELTASONE) 10 MG tablet Take 2  tablets (20 mg total) by mouth 2 (two) times daily with a meal.  Prednisone Taper: 20 mg twice a day for 1 week 15 mg twice daily for 1 wek 10 mg twice daily for 1 week 5 mg twice daily for 1 week Then 5 mg daily  . [DISCONTINUED] Vitamin D, Ergocalciferol, (DRISDOL) 1.25 MG (50000 UNIT) CAPS capsule Take 1 capsule (50,000 Units total) by mouth every 7 (seven) days.   No facility-administered encounter medications on file as of 06/24/2020.    Allergies as of 06/24/2020  . (No Known Allergies)    Past Medical History:  Diagnosis Date  . Anal fissure   . Arthritis    ? IBD related?  . B12 deficiency   . Crohn's disease ([place])   . External hemorrhoids   . Gilbert's syndrome   . Internal hemorrhoids   . Iron deficiency anemia   . Small bowel stricture ([place])    multiple 07/05/00  . TB (tuberculosis)    "latent"- was treated because pt was on Remicade; October 2016    Past Surgical History:  Procedure Laterality Date  . APPENDECTOMY     with initial colon resection including terminal ileum  . CHOLECYSTECTOMY    . [place], 2002   x2  . COLONOSCOPY    . EXPLORATORY LAPAROTOMY  2002   with extensive lysis of adhesions, resection of ileocolonic anastomosis,resection segment small bowel; anastomosis of small bowel  to small bowel; ileocolic anastomosis  . UPPER GASTROINTESTINAL ENDOSCOPY      Family History  Problem Relation Age of Onset  . Arthritis Father        and on mothers side  . Multiple sclerosis Father   . Colon cancer Father 55  . Melanoma Mother   . Breast cancer Mother 74  . Esophageal cancer Neg Hx   . Rectal cancer Neg Hx   . Stomach cancer Neg Hx     Social History   Socioeconomic History  . Marital status: Married    Spouse name: tom  . Number of children: 2  . Years of education: Not on file  . Highest education level: Not on file  Occupational History  . Occupation: [employer]   Tobacco Use  . Smoking status: Never Smoker  . Smokeless tobacco: Never Used  Vaping Use  . Vaping Use: Never  used  Substance and Sexual Activity  . Alcohol use: Yes    Comment: occasional  . Drug use: No  . Sexual activity: Yes    Birth control/protection: Other-see comments  Other Topics Concern  . Not on file  Social History Narrative  . Not on file   Social Determinants of Health   Financial Resource Strain: Not on file  Food Insecurity: Not on file  Transportation Needs: Not on file  Physical Activity: Not on file  Stress: Not on file  Social Connections: Not on file  Intimate Partner Violence: Not on file      Review of systems: All other review of systems negative except as mentioned in the HPI.   Physical Exam: Vitals:   06/24/20 0942  BP: 106/74  Pulse: 65  SpO2: 93%   Body mass index is 20.97 kg/m. Gen:      No acute distress HEENT:  sclera anicteric Abd:      soft, non-tender; no palpable masses, no distension Ext:    No edema Neuro: alert and oriented x 3 Psych: normal mood and affect  Data Reviewed:  Reviewed labs, radiology imaging, old records and pertinent past GI work up   Assessment and Plan/Recommendations:  55 year old very pleasant female with history of Crohn's ileitis s/p terminal ileal resection with ileocolonic anastomosis with acute Crohn's flare, ileitis with active inflammation  Will request insurance approval to increase the frequency of Remicade infusion to every 6 weeks instead of 8 weeks to achieve clinical remission Continue budesonide 9 mg daily We will add low-dose 6-MP 25 mg daily, recheck CBC, CMP and ESR every 4 weeks  We will plan for repeat imaging with CT enterography in 3 to 6 months  Return in 3 months or sooner if needed   The patient was provided an opportunity to ask questions and all were answered. The patient agreed with the plan and demonstrated an understanding of the instructions.  Damaris Hippo , MD    CC: Midge Minium, MD

## 2020-06-24 NOTE — Patient Instructions (Signed)
Your provider has requested that you go to the basement level for lab work before leaving today. Press "B" on the elevator. The lab is located at the first door on the left as you exit the elevator.  You will need follow up (CBC,CMET,and ESR) today and every 4 weeks  We have sent in 6MP ( Mercaptopurine)  to your pharmacy today   Continue Entocort   We will change your Remicade infusions to every 6 weeks instead of 8 weeks  Follow up in 3 months  Due to recent changes in healthcare laws, you may see the results of your imaging and laboratory studies on MyChart before your provider has had a chance to review them.  We understand that in some cases there may be results that are confusing or concerning to you. Not all laboratory results come back in the same time frame and the provider may be waiting for multiple results in order to interpret others.  Please give [place] 48 hours in order for your provider to thoroughly review all the results before contacting the office for clarification of your results.   I appreciate the  opportunity to care for you  Thank You   Harl Bowie , MD

## 2020-06-25 ENCOUNTER — Telehealth: Payer: Self-pay | Admitting: *Deleted

## 2020-06-25 ENCOUNTER — Other Ambulatory Visit: Payer: Self-pay | Admitting: *Deleted

## 2020-06-25 DIAGNOSIS — K50812 Crohn's disease of both small and large intestine with intestinal obstruction: Secondary | ICD-10-CM

## 2020-06-25 DIAGNOSIS — K50018 Crohn's disease of small intestine with other complication: Secondary | ICD-10-CM

## 2020-06-25 DIAGNOSIS — R140 Abdominal distension (gaseous): Secondary | ICD-10-CM

## 2020-06-25 DIAGNOSIS — R109 Unspecified abdominal pain: Secondary | ICD-10-CM

## 2020-06-25 NOTE — Progress Notes (Signed)
Placed standing orders for her labs every 4 weeks

## 2020-06-25 NOTE — Telephone Encounter (Signed)
We seen this patient in the office and Dr Silverio Decamp wants her Remicade infusions changed from every 8 weeks to every 6 weeks. She is due next month

## 2020-06-26 ENCOUNTER — Encounter: Payer: Self-pay | Admitting: Gastroenterology

## 2020-06-30 ENCOUNTER — Other Ambulatory Visit: Payer: Self-pay

## 2020-06-30 DIAGNOSIS — K50812 Crohn's disease of both small and large intestine with intestinal obstruction: Secondary | ICD-10-CM

## 2020-06-30 MED ORDER — INFLIXIMAB 100 MG IV SOLR: 10.0000 mg/kg | INTRAVENOUS | 6 refills | Status: DC

## 2020-06-30 NOTE — Telephone Encounter (Signed)
Increased dose/frequency faxed to [facility]

## 2020-06-30 NOTE — Telephone Encounter (Signed)
[facility] representative called to advise this patient is not in their system.

## 2020-06-30 NOTE — Telephone Encounter (Signed)
Request faxed to [facility]

## 2020-07-17 ENCOUNTER — Other Ambulatory Visit: Payer: Self-pay | Admitting: Gastroenterology

## 2020-07-23 ENCOUNTER — Telehealth: Payer: Self-pay | Admitting: Gastroenterology

## 2020-07-23 NOTE — Telephone Encounter (Signed)
[facility] called regarding the message below. They stated that they faxed form yesterday afternoon.

## 2020-07-23 NOTE — Telephone Encounter (Signed)
Pt called to inform that her insurance company faxed a form today that needs to be filled out by Dr. Silverio Decamp. She has Remicade appt tomorrow at 1:30pm. Her insurance will not approved infusion until form is signed.

## 2020-07-24 NOTE — Telephone Encounter (Signed)
Please check if she will be willing to switch ?

## 2020-07-24 NOTE — Telephone Encounter (Signed)
[facility] has faxed to me the denial from the patient's insurance as of today. The denial indicates the patient must try Inflectra for a 1 month period and fail before they will consider Remicade.

## 2020-07-24 NOTE — Telephone Encounter (Signed)
Cecille Rubin from [facility] calling regarding the denial for Remicade tel : [phone] ext [phone]

## 2020-07-24 NOTE — Telephone Encounter (Signed)
Patient is not interested in changing. I have contacted [employer].  This is in review [phone] I have had it marked urgent. The patient was not allowed to have her 8 week Remicade today. We should have an answer by the end of the day. I have spoken with Cecille Rubin at [facility].

## 2020-07-30 ENCOUNTER — Telehealth: Payer: Self-pay | Admitting: Gastroenterology

## 2020-07-30 NOTE — Telephone Encounter (Signed)
[facility] called inquiring about prescription for Remicade. They are aware that this is still in review with pt's insurance so they are requesting to let them know when it is approved. [facility] phone is  [phone] and fax # [phone].

## 2020-08-04 NOTE — Telephone Encounter (Signed)
My attempt to re-instate Remicade every 8 weeks failed. She is now without any Remicade infusion. Holland Falling wants her to try and fail Inflectra.  I have called the patient to discuss changing her infusion center.

## 2020-08-05 NOTE — Telephone Encounter (Signed)
Ok, please send me details (case #). Thanks

## 2020-08-05 NOTE — Telephone Encounter (Signed)
The patient and I have communicated by voicemail. She left me a message that her HR person was able to get her approval for 1 dose of the Remicade. She is therefore still on her every 8 week schedule.  I called [employer] and scheduled a peer to peer review. The last day to do the peer to peer is 08/07/20. Can you talk with their doctor on 08/07/20 at 12:30pm? The patient and I have not had the discussion about changing to the [facility] site yet.

## 2020-08-05 NOTE — Telephone Encounter (Signed)
C [id number] I have told Holland Falling twice that you are the prescribing MD. They have the name of Dr Amil Amen. I have explained this is the MD at the infusion site, not the prescribing and managing MD. Hopefully that part is straightened out as well.

## 2020-08-05 NOTE — Telephone Encounter (Signed)
I think because its done at [place] [facility], Dr Amil Amen is the prescribing MD. I dont have privileges to prescribe there. He will have to do peer to peer unless insurance is fine with discussing the case with me

## 2020-08-07 NOTE — Telephone Encounter (Signed)
Insurance accepted our peer to peer. Remicade approved. Patient notified.

## 2020-08-20 NOTE — Telephone Encounter (Signed)
[facility] calling to check status on medication.  Can be reached at [phone] ext [phone].

## 2020-08-21 NOTE — Telephone Encounter (Signed)
Attempted to return call. Faxed back the form with a message. This was approved in May 2022 and is good until May 2023. [facility] has the approval information.  Phone [phone] and fax [phone]

## 2020-09-17 ENCOUNTER — Encounter: Payer: Self-pay | Admitting: *Deleted

## 2020-09-28 ENCOUNTER — Other Ambulatory Visit: Payer: Self-pay | Admitting: Gastroenterology

## 2020-11-28 ENCOUNTER — Encounter: Payer: BC Managed Care – PPO | Admitting: Family Medicine

## 2020-12-02 ENCOUNTER — Other Ambulatory Visit: Payer: Self-pay

## 2020-12-02 MED ORDER — BUDESONIDE 3 MG PO CPEP: 9.0000 mg | ORAL_CAPSULE | Freq: Every day | ORAL | 3 refills | Status: DC

## 2021-01-23 ENCOUNTER — Ambulatory Visit: Payer: No Typology Code available for payment source | Admitting: Gastroenterology

## 2021-01-23 ENCOUNTER — Encounter: Payer: Self-pay | Admitting: Gastroenterology

## 2021-01-23 VITALS — BP 119/77 | HR 77 | Ht 63.0 in | Wt 115.8 lb

## 2021-01-23 DIAGNOSIS — K580 Irritable bowel syndrome with diarrhea: Secondary | ICD-10-CM | POA: Diagnosis not present

## 2021-01-23 DIAGNOSIS — K50812 Crohn's disease of both small and large intestine with intestinal obstruction: Secondary | ICD-10-CM

## 2021-01-23 DIAGNOSIS — K9089 Other intestinal malabsorption: Secondary | ICD-10-CM | POA: Diagnosis not present

## 2021-01-23 MED ORDER — BUDESONIDE 3 MG PO CPEP: 9.0000 mg | ORAL_CAPSULE | Freq: Every day | ORAL | 3 refills | Status: DC

## 2021-01-23 MED ORDER — CHOLESTYRAMINE LIGHT 4 GM/DOSE PO POWD: 4.0000 g | Freq: Every day | ORAL | 3 refills | Status: DC

## 2021-01-23 NOTE — Patient Instructions (Addendum)
If you are age 53 or older, your body mass index should be between 23-30. Your Body mass index is 20.51 kg/m. If this is out of the aforementioned range listed, please consider follow up with your Primary Care Provider.  If you are age 82 or younger, your body mass index should be between 19-25. Your Body mass index is 20.51 kg/m. If this is out of the aformentioned range listed, please consider follow up with your Primary Care Provider.   ________________________________________________________  The [facility] providers would like to encourage you to use [facility] to communicate with providers for non-urgent requests or questions.  Due to long hold times on the telephone, sending your provider a message by [facility] may be a faster and more efficient way to get a response.  Please allow 48 business hours for a response.  Please remember that this is for non-urgent requests.  _______________________________________________________  Please go to the lab in the basement of our building to have lab work done on November 14th. Hit "B" for basement when you get on the elevator.  When the doors open the lab is on your left.  We will call you with the results. Thank you.  We have sent the following medications to your pharmacy for you to pick up at your convenience: Budesonide 9 mg: Take once daily  Prevalite 4 g: daily (as discussed, split it up throughout the day)  You have a follow up appointment on Tuesday, 03-31-21 at 8:30am.  I appreciate the opportunity to care for you. Thank you for choosing me and [facility],  Dr. Harl Bowie

## 2021-01-23 NOTE — Progress Notes (Signed)
Karen Knox    [id number]    10-16-1965  Primary Care Physician:Tabori, Aundra Millet, MD  Referring Physician: Midge Minium, MD [address] [place],   [address]   Chief complaint:  Crohn's disease  HPI:  55 year old very pleasant female with longstanding history of Crohn's ileitis, s/p terminal ileal resection with ileocolonic anastomosis here for follow-up visit for  Crohn's disease She had an acute flare of Crohn's disease in January 2022, was initially treated with prednisone taper and is currently on budesonide 9 mg daily for past few months. She did not tolerate 6-MP, stopped it after 3 weeks.  Overall she feels her Crohn's symptoms have stabilized, she has mild intermittent discomfort.  She continues to have increased bowel frequency and urgency. She is doing Remicade infusion every 6 weeks, is due for next infusion on November 14.  On average she is having 3-5 semiformed to liquid bowel movements per day and she has to wake up almost 3 or 4 times a week middle of the night to have a bowel movement.  Denies any rectal bleeding.  No weight loss but she has not gained any weight either.   Colonoscopy April 18, 2020: - Patent end-to-side ileo-colonic anastomosis, characterized by ulceration. - A few ulcers in the terminal ileum. Biopsied. - Normal mucosa in the entire examined colon. - The examination was otherwise normal.   Surgical [P], small bowel, terminal ileum ulcers - SEVERELY ACTIVE CHRONIC ILEITIS WITH ULCERATION, CONSISTENT WITH PATIENT'S CLINICAL HISTORY OF CROHN'S DISEASE - NEGATIVE FOR GRANULOMAS OR DYSPLASIA - VIRAL CYTOPATHIC CHANGES ARE NOT SEENSurgical [P], small bowel, terminal ileum ulcers - SEVERELY ACTIVE CHRONIC ILEITIS WITH ULCERATION, CONSISTENT WITH PATIENT'S CLINICAL HISTORY OF CROHN'S DISEASE - NEGATIVE FOR GRANULOMAS OR DYSPLASIA - VIRAL CYTOPATHIC CHANGES ARE NOT SEEN       GI history: Colonoscopy  July 2018 showed terminal ileal stricture with erosions and friable tissue, biopsies showed evidence of active ileitis.     After colonoscopy she was treated with Entocort for 30 days with improvement of symptoms.   October 07, 2016: Infliximab  drug trough of 21 with undetectable antibody.  She was getting infliximab infusions every 6 weeks   Repeat on April 19, 2017 :infliximab drug trough was 11 with undetectable antibodies    Colonoscopy 04/18/20: - Patent end-to-side ileo-colonic anastomosis, characteri zed by ulceration. - A few ulcers in the terminal ileum. Biopsied. - Normal mucosa in the entire examined colon. - The examination was otherwise normal.   Surgical [P], small bowel, terminal ileum ulcers - SEVERELY ACTIVE CHRONIC ILEITIS WITH ULCERATION, CONSISTENT WITH PATIENT'S CLINICAL HISTORY OF CROHN'S DISEASE - NEGATIVE FOR GRANULOMAS OR DYSPLASIA - VIRAL CYTOPATHIC CHANGES ARE NOT SEEN   Outpatient Encounter Medications as of 01/23/2021  Medication Sig   budesonide (ENTOCORT EC) 3 MG 24 hr capsule Take 3 capsules (9 mg total) by mouth daily.   cyanocobalamin (,VITAMIN B-12,) 1000 MCG/ML injection INJECT 1 ML (1,000 MCG TOTAL) INTO THE MUSCLE EVERY 30 (THIRTY) DAYS.   inFLIXimab (REMICADE) 100 MG injection Inject 500 mg into the vein every 6 (six) weeks.   [DISCONTINUED] mercaptopurine (PURINETHOL) 50 MG tablet Take 0.5 tablets (25 mg total) by mouth daily. Give on an empty stomach 1 hour before or 2 hours after meals. Caution: Chemotherapy. (Patient not taking: Reported on 01/23/2021)   No facility-administered encounter medications on file as of 01/23/2021.    Allergies as of 01/23/2021   (  No Known Allergies)    Past Medical History:  Diagnosis Date   Anal fissure    Arthritis    ? IBD related?   B12 deficiency    Crohn's disease ([place])    External hemorrhoids    Gilbert's syndrome    Internal hemorrhoids    Iron deficiency anemia    Small bowel stricture ([place])     multiple 07/05/00   TB (tuberculosis)    "latent"- was treated because pt was on Remicade; October 2016    Past Surgical History:  Procedure Laterality Date   APPENDECTOMY     with initial colon resection including terminal ileum   CHOLECYSTECTOMY     COLON RESECTION  1993, 2002   x2   COLONOSCOPY     EXPLORATORY LAPAROTOMY  2002   with extensive lysis of adhesions, resection of ileocolonic anastomosis,resection segment small bowel; anastomosis of small bowel  to small bowel; ileocolic anastomosis   UPPER GASTROINTESTINAL ENDOSCOPY      Family History  Problem Relation Age of Onset   Arthritis Father        and on mothers side   Multiple sclerosis Father    Colon cancer Father 67   Melanoma Mother    Breast cancer Mother 28   Esophageal cancer Neg Hx    Rectal cancer Neg Hx    Stomach cancer Neg Hx     Social History   Socioeconomic History   Marital status: Married    Spouse name: tom   Number of children: 2   Years of education: Not on file   Highest education level: Not on file  Occupational History   Occupation: [employer]   Tobacco Use   Smoking status: Never   Smokeless tobacco: Never  Vaping Use   Vaping Use: Never used  Substance and Sexual Activity   Alcohol use: Yes    Comment: occasional   Drug use: No   Sexual activity: Yes    Birth control/protection: Other-see comments  Other Topics Concern   Not on file  Social History Narrative   Not on file   Social Determinants of Health   Financial Resource Strain: Not on file  Food Insecurity: Not on file  Transportation Needs: Not on file  Physical Activity: Not on file  Stress: Not on file  Social Connections: Not on file  Intimate Partner Violence: Not on file      Review of systems: All other review of systems negative except as mentioned in the HPI.   Physical Exam: Vitals:   01/23/21 0839  BP: 119/77  Pulse: 77  SpO2: 97%   Body mass index is 20.51 kg/m. Gen:      No  acute distress HEENT:  sclera anicteric Abd:      soft, non-tender; no palpable masses, no distension Ext:    No edema Neuro: alert and oriented x 3 Psych: normal mood and affect  Data Reviewed:  Reviewed labs, radiology imaging, old records and pertinent past GI work up   Assessment and Plan/Recommendations:  55 year old very pleasant female with history of Crohn's ileitis s/p terminal ileal resection with ileocolonic anastomosis with acute Crohn's flare, ileitis with active inflammation in Jan 2022  Continue Remicade infusion every 6 weeks  Will check Remicade drug trough and antibody level prior to next infusion on November 14th Continue budesonide 9 mg daily  IBD health maintenance: Follow-up CBC, CMP, CRP, ESR, iron panel, B12, folate, vitamin D and TB QuantiFERON gold She is up-to-date  with vaccinations  Possible bile salt induced diarrhea exacerbating her symptoms.  Will empirically start Prevalite 4 g daily.  Advised patient to start with 1 teaspoon daily and slowly titrate up as tolerated and spurted throughout the day.  Avoid taking it within 3 to 4 hours of other medication to prevent drug interaction  If she continues to have persistent nocturnal diarrhea and increased bowel frequency, we will plan for repeat imaging with CT enterography and colonoscopy for further evaluation If her symptoms are improving, we will plan to slowly taper off budesonide   Return in 2-3 months or sooner if needed  This visit required 41 minutes of patient care (this includes precharting, chart review, review of results, face-to-face time used for counseling as well as treatment plan and follow-up. The patient was provided an opportunity to ask questions and all were answered. The patient agreed with the plan and demonstrated an understanding of the instructions.  Damaris Hippo , MD    CC: Midge Minium, MD

## 2021-01-30 ENCOUNTER — Other Ambulatory Visit (INDEPENDENT_AMBULATORY_CARE_PROVIDER_SITE_OTHER): Payer: No Typology Code available for payment source

## 2021-01-30 DIAGNOSIS — K50812 Crohn's disease of both small and large intestine with intestinal obstruction: Secondary | ICD-10-CM

## 2021-01-30 DIAGNOSIS — K580 Irritable bowel syndrome with diarrhea: Secondary | ICD-10-CM | POA: Diagnosis not present

## 2021-01-30 LAB — CBC WITH DIFFERENTIAL/PLATELET
Basophils Absolute: 0 10*3/uL (ref 0.0–0.1)
Basophils Relative: 0.3 % (ref 0.0–3.0)
Eosinophils Absolute: 0.1 10*3/uL (ref 0.0–0.7)
Eosinophils Relative: 1.2 % (ref 0.0–5.0)
HCT: 39.6 % (ref 36.0–46.0)
Hemoglobin: 12.8 g/dL (ref 12.0–15.0)
Lymphocytes Relative: 46.2 % — ABNORMAL HIGH (ref 12.0–46.0)
Lymphs Abs: 3.2 10*3/uL (ref 0.7–4.0)
MCHC: 32.3 g/dL (ref 30.0–36.0)
MCV: 82.1 fl (ref 78.0–100.0)
Monocytes Absolute: 0.7 10*3/uL (ref 0.1–1.0)
Monocytes Relative: 9.5 % (ref 3.0–12.0)
Neutro Abs: 3 10*3/uL (ref 1.4–7.7)
Neutrophils Relative %: 42.8 % — ABNORMAL LOW (ref 43.0–77.0)
Platelets: 376 10*3/uL (ref 150.0–400.0)
RBC: 4.83 Mil/uL (ref 3.87–5.11)
RDW: 13.6 % (ref 11.5–15.5)
WBC: 6.9 10*3/uL (ref 4.0–10.5)

## 2021-01-30 LAB — COMPREHENSIVE METABOLIC PANEL
ALT: 10 U/L (ref 0–35)
AST: 13 U/L (ref 0–37)
Albumin: 3.9 g/dL (ref 3.5–5.2)
Alkaline Phosphatase: 74 U/L (ref 39–117)
BUN: 13 mg/dL (ref 6–23)
CO2: 28 mEq/L (ref 19–32)
Calcium: 9 mg/dL (ref 8.4–10.5)
Chloride: 103 mEq/L (ref 96–112)
Creatinine, Ser: 0.77 mg/dL (ref 0.40–1.20)
GFR: 88.27 mL/min (ref >60.00)
Glucose, Bld: 105 mg/dL — ABNORMAL HIGH (ref 70–99)
Potassium: 4 mEq/L (ref 3.5–5.1)
Sodium: 141 mEq/L (ref 135–145)
Total Bilirubin: 1.9 mg/dL — ABNORMAL HIGH (ref 0.2–1.2)
Total Protein: 6.7 g/dL (ref 6.0–8.3)

## 2021-01-30 LAB — VITAMIN D 25 HYDROXY (VIT D DEFICIENCY, FRACTURES): VITD: 16.37 ng/mL — ABNORMAL LOW (ref 30.00–100.00)

## 2021-01-30 LAB — IBC + FERRITIN
Ferritin: 2.1 ng/mL — ABNORMAL LOW (ref 10.0–291.0)
Iron: 33 ug/dL — ABNORMAL LOW (ref 42–145)
Saturation Ratios: 5.5 % — ABNORMAL LOW (ref 20.0–50.0)
TIBC: 597.8 ug/dL — ABNORMAL HIGH (ref 250.0–450.0)
Transferrin: 427 mg/dL — ABNORMAL HIGH (ref 212.0–360.0)

## 2021-01-30 LAB — SEDIMENTATION RATE: Sed Rate: 15 mm/hr (ref 0–30)

## 2021-01-30 LAB — B12 AND FOLATE PANEL
Folate: 20.7 ng/mL (ref >5.9)
Vitamin B-12: 255 pg/mL (ref 211–911)

## 2021-01-30 LAB — C-REACTIVE PROTEIN: CRP: <1 mg/dL (ref 0.5–20.0)

## 2021-02-04 LAB — QUANTIFERON-TB GOLD PLUS
Mitogen-NIL: >10 IU/mL
NIL: 0.42 IU/mL
QuantiFERON-TB Gold Plus: NEGATIVE
TB1-NIL: 0.15 IU/mL
TB2-NIL: 0.01 IU/mL

## 2021-02-08 LAB — SERIAL MONITORING

## 2021-02-11 LAB — INFLIXIMAB+AB (SERIAL MONITOR)
Anti-Infliximab Antibody: <22 ng/mL
Infliximab Drug Level: 20 ug/mL

## 2021-02-17 ENCOUNTER — Ambulatory Visit (INDEPENDENT_AMBULATORY_CARE_PROVIDER_SITE_OTHER): Payer: No Typology Code available for payment source | Admitting: Family Medicine

## 2021-02-17 ENCOUNTER — Encounter: Payer: Self-pay | Admitting: Family Medicine

## 2021-02-17 VITALS — BP 118/66 | HR 80 | Temp 98.7°F | Resp 17 | Ht 63.0 in | Wt 113.0 lb

## 2021-02-17 DIAGNOSIS — E559 Vitamin D deficiency, unspecified: Secondary | ICD-10-CM

## 2021-02-17 DIAGNOSIS — J111 Influenza due to unidentified influenza virus with other respiratory manifestations: Secondary | ICD-10-CM | POA: Diagnosis not present

## 2021-02-17 DIAGNOSIS — Z0001 Encounter for general adult medical examination with abnormal findings: Secondary | ICD-10-CM | POA: Diagnosis not present

## 2021-02-17 DIAGNOSIS — Z0000 Encounter for general adult medical examination without abnormal findings: Secondary | ICD-10-CM

## 2021-02-17 DIAGNOSIS — E781 Pure hyperglyceridemia: Secondary | ICD-10-CM | POA: Diagnosis not present

## 2021-02-17 LAB — TSH: TSH: 1.84 u[IU]/mL (ref 0.35–5.50)

## 2021-02-17 LAB — LIPID PANEL
Cholesterol: 138 mg/dL (ref 0–200)
HDL: 57.5 mg/dL (ref >39.00)
NonHDL: 80.78
Total CHOL/HDL Ratio: 2
Triglycerides: 226 mg/dL — ABNORMAL HIGH (ref 0.0–149.0)
VLDL: 45.2 mg/dL — ABNORMAL HIGH (ref 0.0–40.0)

## 2021-02-17 LAB — LDL CHOLESTEROL, DIRECT: Direct LDL: 49 mg/dL

## 2021-02-17 LAB — VITAMIN D 25 HYDROXY (VIT D DEFICIENCY, FRACTURES): VITD: 13.43 ng/mL — ABNORMAL LOW (ref 30.00–100.00)

## 2021-02-17 MED ORDER — GUAIFENESIN-CODEINE 100-10 MG/5ML PO SYRP: 10.0000 mL | ORAL_SOLUTION | Freq: Three times a day (TID) | ORAL | 0 refills | Status: DC | PRN

## 2021-02-17 MED ORDER — ALBUTEROL SULFATE HFA 108 (90 BASE) MCG/ACT IN AERS: 2.0000 | INHALATION_SPRAY | Freq: Four times a day (QID) | RESPIRATORY_TRACT | 0 refills | Status: DC | PRN

## 2021-02-17 NOTE — Patient Instructions (Signed)
Follow up in 1 year or as needed We'll notify you of your lab results and make any changes if needed USE the codeine cough syrup as needed for cough If you start a coughing fit, take 2 puffs on the inhaler to help open your airways and break the spasm Continue to drink LOTS of fluids and rest [place] Call with any questions or concerns Hang in there! Happy Holidays!!

## 2021-02-17 NOTE — Progress Notes (Signed)
Subjective:    Patient ID: Karen Knox, female    DOB: 09-03-1965, 55 y.o.   MRN: [id number]  HPI CPE- UTD on colonoscopy.  Due for mammo.  UTD on GYN.  Declines flu shot.  URI- sxs started ~4 days ago.  Multiple sick contacts over Thanksgiving.  'i feel like I've been run over'.  + fever- none since Sunday.  'started to feel human' last night.  Had body aches, fatigue.  + skin sensitivity.  + hacking cough.    Patient Care Team    Relationship Specialty Notifications Start End  Midge Minium, MD PCP - General Family Medicine  02/14/18   Hennie Duos, MD Consulting Physician Rheumatology  02/14/18   Linda Hedges, Superior Physician Obstetrics and Gynecology  02/14/18   Mauri Pole, MD Consulting Physician Gastroenterology  02/14/18     Health Maintenance  Topic Date Due   HIV Screening  Never done   Zoster Vaccines- Shingrix (1 of 2) Never done   Pneumococcal Vaccine 6-34 Years old (3 - PCV) 02/15/2019   MAMMOGRAM  05/17/2019   COVID-19 Vaccine (3 - Pfizer risk series) 07/29/2019   PAP SMEAR-Modifier  05/16/2020   INFLUENZA VACCINE  10/20/2020   COLONOSCOPY (Pts 45-52yr Insurance coverage will need to be confirmed)  04/18/2021   TETANUS/TDAP  10/29/2026   Hepatitis C Screening  Completed   HPV VACCINES  Aged Out      Review of Systems Patient reports no vision/ hearing changes, adenopathy,fever, weight change,  persistant/recurrent hoarseness , swallowing issues, chest pain, palpitations, edema, persistant/recurrent cough, hemoptysis, dyspnea (rest/exertional/paroxysmal nocturnal), gastrointestinal bleeding (melena, rectal bleeding), abdominal pain, significant heartburn, bowel changes, GU symptoms (dysuria, hematuria, incontinence), Gyn symptoms (abnormal  bleeding, pain),  syncope, focal weakness, memory loss, numbness & tingling, skin/hair/nail changes, abnormal bruising or bleeding, anxiety, or depression.   This visit occurred during the  SARS-CoV-2 public health emergency.  Safety protocols were in place, including screening questions prior to the visit, additional usage of staff PPE, and extensive cleaning of exam room while observing appropriate contact time as indicated for disinfecting solutions.      Objective:   Physical Exam General Appearance:    Alert, cooperative, no distress, appears stated age  Head:    Normocephalic, without obvious abnormality, atraumatic  Eyes:    PERRL, conjunctiva/corneas clear, EOM's intact, fundi    benign, both eyes  Ears:    Normal TM's and external ear canals, both ears  Nose:   Deferred due to COVID  Throat:   Neck:   Supple, symmetrical, trachea midline, no adenopathy;    Thyroid: no enlargement/tenderness/nodules  Back:     Symmetric, no curvature, ROM normal, no CVA tenderness  Lungs:     Clear to auscultation bilaterally, respirations unlabored  Chest Wall:    No tenderness or deformity   Heart:    Regular rate and rhythm, S1 and S2 normal, no murmur, rub   or gallop  Breast Exam:    Deferred to GYN  Abdomen:     Soft, non-tender, bowel sounds active all four quadrants,    no masses, no organomegaly  Genitalia:    Deferred to GYN  Rectal:    Extremities:   Extremities normal, atraumatic, no cyanosis or edema  Pulses:   2+ and symmetric all extremities  Skin:   Skin color, texture, turgor normal, no rashes or lesions  Lymph nodes:   Cervical, supraclavicular, and axillary nodes normal  Neurologic:  CNII-XII intact, normal strength, sensation and reflexes    throughout          Assessment & Plan:   ILI- new.  Sxs started 4 days ago.  She is out of the Tamiflu window and at this point seems to be improving w/o intervention.  Will treat supportively w/ cough syrup and inhaler.  Reviewed supportive care and red flags that should prompt return.  Pt expressed understanding and is in agreement w/ plan.

## 2021-02-17 NOTE — Assessment & Plan Note (Signed)
Pt's PE WNL w/ exception of near constant cough.  UTD on pap.  Due for mammo.  UTD on colonoscopy.  Declines flu as she currently has flu like illness.  Reviewed recent labs.  Add TSH and lipids which were not done.  Anticipatory guidance provided.

## 2021-02-17 NOTE — Assessment & Plan Note (Signed)
Check labs and treat prn.

## 2021-02-17 NOTE — Assessment & Plan Note (Signed)
Check labs and replete prn. 

## 2021-02-19 ENCOUNTER — Telehealth: Payer: Self-pay

## 2021-02-19 NOTE — Telephone Encounter (Signed)
-----   Message from Midge Minium, MD sent at 02/17/2021  2:21 PM EST ----- Labs look great w/ exception of low Vit D.  We will send in a prescription for Vit D 50,000 units to be taken weekly x12 weeks.  This should be in addition to a daily OTC Vit D supplement.

## 2021-02-19 NOTE — Telephone Encounter (Signed)
Left vm for patient to return call about labs

## 2021-02-24 ENCOUNTER — Telehealth: Payer: Self-pay

## 2021-02-24 NOTE — Telephone Encounter (Signed)
Left vm informing patient of labs, let her know they were also on her my chart. I will get Dr Birdie Riddle to send in the new rx for Vit D.

## 2021-02-24 NOTE — Telephone Encounter (Signed)
-----   Message from Midge Minium, MD sent at 02/17/2021  2:21 PM EST ----- Labs look great w/ exception of low Vit D.  We will send in a prescription for Vit D 50,000 units to be taken weekly x12 weeks.  This should be in addition to a daily OTC Vit D supplement.

## 2021-02-25 MED ORDER — VITAMIN D (ERGOCALCIFEROL) 1.25 MG (50000 UNIT) PO CAPS: 50000.0000 [IU] | ORAL_CAPSULE | ORAL | 0 refills | Status: DC

## 2021-02-25 NOTE — Addendum Note (Signed)
Addended by: Midge Minium on: 02/25/2021 07:26 AM   Modules accepted: Orders

## 2021-03-31 ENCOUNTER — Ambulatory Visit: Payer: No Typology Code available for payment source | Admitting: Gastroenterology

## 2021-03-31 ENCOUNTER — Encounter: Payer: Self-pay | Admitting: Gastroenterology

## 2021-03-31 VITALS — BP 158/90 | HR 60 | Ht 63.0 in | Wt 114.8 lb

## 2021-03-31 DIAGNOSIS — E559 Vitamin D deficiency, unspecified: Secondary | ICD-10-CM | POA: Diagnosis not present

## 2021-03-31 DIAGNOSIS — K5080 Crohn's disease of both small and large intestine without complications: Secondary | ICD-10-CM | POA: Diagnosis not present

## 2021-03-31 DIAGNOSIS — K529 Noninfective gastroenteritis and colitis, unspecified: Secondary | ICD-10-CM

## 2021-03-31 DIAGNOSIS — Z23 Encounter for immunization: Secondary | ICD-10-CM | POA: Diagnosis not present

## 2021-03-31 DIAGNOSIS — D500 Iron deficiency anemia secondary to blood loss (chronic): Secondary | ICD-10-CM

## 2021-03-31 DIAGNOSIS — K50119 Crohn's disease of large intestine with unspecified complications: Secondary | ICD-10-CM

## 2021-03-31 DIAGNOSIS — K9089 Other intestinal malabsorption: Secondary | ICD-10-CM | POA: Diagnosis not present

## 2021-03-31 NOTE — Progress Notes (Signed)
Karen Knox    EE:[id number]    Mar 27, 1965  Primary Care Physician:Tabori, Aundra Millet, MD  Referring Physician: Midge Minium, MD [address] [place],  [place] [address]   Chief complaint: Crohn's disease  HPI:  56 year old very pleasant female with longstanding history of Crohn's ileitis, s/p terminal ileal resection with ileocolonic anastomosis here for follow-up visit for  Crohn's disease  She had an acute flare of Crohn's disease in January 2022, was initially treated with prednisone taper and is currently on budesonide 9 mg daily for past 6 months.  Bowel frequency and urgency has improved with Prevalite, she was using half packet daily initially for few weeks but since her symptoms have improved she is currently taking it as needed  She did not tolerate 6-MP, stopped it after 3 weeks.   Overall she feels her Crohn's symptoms have stabilized, she has mild intermittent discomfort.  She is doing Remicade infusion every 6 weeks, November 2020 to Remicade antibody level was undetectable and drug trough 20.   Denies any rectal bleeding.  No weight loss    Colonoscopy April 18, 2020: - Patent end-to-side ileo-colonic anastomosis, characterized by ulceration. - A few ulcers in the terminal ileum. Biopsied. - Normal mucosa in the entire examined colon. - The examination was otherwise normal.   Surgical [P], small bowel, terminal ileum ulcers - SEVERELY ACTIVE CHRONIC ILEITIS WITH ULCERATION, CONSISTENT WITH PATIENT'S CLINICAL HISTORY OF CROHN'S DISEASE - NEGATIVE FOR GRANULOMAS OR DYSPLASIA - VIRAL CYTOPATHIC CHANGES ARE NOT SEENSurgical [P], small bowel, terminal ileum ulcers - SEVERELY ACTIVE CHRONIC ILEITIS WITH ULCERATION, CONSISTENT WITH PATIENT'S CLINICAL HISTORY OF CROHN'S DISEASE - NEGATIVE FOR GRANULOMAS OR DYSPLASIA - VIRAL CYTOPATHIC CHANGES ARE NOT SEEN       GI history: Colonoscopy July 2018 showed terminal ileal stricture with  erosions and friable tissue, biopsies showed evidence of active ileitis.     After colonoscopy she was treated with Entocort for 30 days with improvement of symptoms.   October 07, 2016: Infliximab  drug trough of 21 with undetectable antibody.  She was getting infliximab infusions every 6 weeks   Repeat on April 19, 2017 :infliximab drug trough was 11 with undetectable antibodies    Colonoscopy 04/18/20: - Patent end-to-side ileo-colonic anastomosis, characteri zed by ulceration. - A few ulcers in the terminal ileum. Biopsied. - Normal mucosa in the entire examined colon. - The examination was otherwise normal.   Surgical [P], small bowel, terminal ileum ulcers - SEVERELY ACTIVE CHRONIC ILEITIS WITH ULCERATION, CONSISTENT WITH PATIENT'S CLINICAL HISTORY OF CROHN'S DISEASE - NEGATIVE FOR GRANULOMAS OR DYSPLASIA - VIRAL CYTOPATHIC CHANGES ARE NOT SEEN   Outpatient Encounter Medications as of 03/31/2021  Medication Sig   albuterol (VENTOLIN HFA) 108 (90 Base) MCG/ACT inhaler Inhale 2 puffs into the lungs every 6 (six) hours as needed for wheezing or shortness of breath.   budesonide (ENTOCORT EC) 3 MG 24 hr capsule Take 3 capsules (9 mg total) by mouth daily.   cholestyramine light (PREVALITE) 4 GM/DOSE powder Take 1 packet (4 g total) by mouth daily.   cyanocobalamin (,VITAMIN B-12,) 1000 MCG/ML injection INJECT 1 ML (1,000 MCG TOTAL) INTO THE MUSCLE EVERY 30 (THIRTY) DAYS.   guaiFENesin-codeine (ROBITUSSIN AC) 100-10 MG/5ML syrup Take 10 mLs by mouth 3 (three) times daily as needed for cough.   inFLIXimab (REMICADE) 100 MG injection Inject 500 mg into the vein every 6 (six) weeks.   Vitamin  D, Ergocalciferol, (DRISDOL) 1.25 MG (50000 UNIT) CAPS capsule Take 1 capsule (50,000 Units total) by mouth every 7 (seven) days.   No facility-administered encounter medications on file as of 03/31/2021.    Allergies as of 03/31/2021   (No Known Allergies)    Past Medical History:  Diagnosis  Date   Anal fissure    Arthritis    ? IBD related?   B12 deficiency    Crohn's disease ([place])    External hemorrhoids    Gilbert's syndrome    Internal hemorrhoids    Iron deficiency anemia    Small bowel stricture ([place])    multiple 07/05/00   TB (tuberculosis)    "latent"- was treated because pt was on Remicade; October 2016    Past Surgical History:  Procedure Laterality Date   APPENDECTOMY     with initial colon resection including terminal ileum   CHOLECYSTECTOMY     COLON RESECTION  1993, 2002   x2   COLONOSCOPY     EXPLORATORY LAPAROTOMY  2002   with extensive lysis of adhesions, resection of ileocolonic anastomosis,resection segment small bowel; anastomosis of small bowel  to small bowel; ileocolic anastomosis   UPPER GASTROINTESTINAL ENDOSCOPY      Family History  Problem Relation Age of Onset   Arthritis Father        and on mothers side   Multiple sclerosis Father    Colon cancer Father 67   Melanoma Mother    Breast cancer Mother 38   Esophageal cancer Neg Hx    Rectal cancer Neg Hx    Stomach cancer Neg Hx     Social History   Socioeconomic History   Marital status: Married    Spouse name: tom   Number of children: 2   Years of education: Not on file   Highest education level: Not on file  Occupational History   Occupation: [employer]   Tobacco Use   Smoking status: Never   Smokeless tobacco: Never  Vaping Use   Vaping Use: Never used  Substance and Sexual Activity   Alcohol use: Yes    Comment: occasional   Drug use: No   Sexual activity: Yes    Birth control/protection: Other-see comments  Other Topics Concern   Not on file  Social History Narrative   Not on file   Social Determinants of Health   Financial Resource Strain: Not on file  Food Insecurity: Not on file  Transportation Needs: Not on file  Physical Activity: Not on file  Stress: Not on file  Social Connections: Not on file  Intimate Partner Violence: Not on file       Review of systems: All other review of systems negative except as mentioned in the HPI.   Physical Exam: There were no vitals filed for this visit. There is no height or weight on file to calculate BMI. Gen:      No acute distress HEENT:  sclera anicteric Abd:      soft, non-tender; no palpable masses, no distension Ext:    No edema Neuro: alert and oriented x 3 Psych: normal mood and affect  Data Reviewed:  Reviewed labs, radiology imaging, old records and pertinent past GI work up   Assessment and Plan/Recommendations:  56 year old very pleasant female with history of Crohn's ileitis s/p terminal ileal resection with ileocolonic anastomosis with acute Crohn's flare, ileitis with active inflammation in Jan 2022   Continue Remicade infusion every 6 weeks  She has undetectable  antibody level with therapeutic drug trough  We will plan to taper down budesonide dose to 6 mg daily for 30 days followed by 3 mg daily for 3 days and then stop   IBD health maintenance: She is due for flu vaccination, will give it today during this visit Encouraged patient to do COVID booster shot in 2 weeks  Bile salt diarrhea: Use one fourth to half packet daily based on response for symptom control  Vitamin D deficiency: Use daily vitamin D 400 international units and complete the high-dose vitamin D as prescribed  Severe iron deficiency secondary to chronic GI occult blood loss: She does not tolerate oral iron. Will need IV iron infusion, Feraheme x2 Advised patient to try oral chelated iron capsule 1 daily if she is able to tolerate it    Return in 2-3 months or sooner if needed   The patient was provided an opportunity to ask questions and all were answered. The patient agreed with the plan and demonstrated an understanding of the instructions.  Damaris Hippo , MD    CC: Midge Minium, MD

## 2021-03-31 NOTE — Patient Instructions (Addendum)
We are giving you your Flu Shot today  Budesonide taper: 6mg  x 30 days then decrease to 3 mg for 30 days   Use Prevalite 1/4 packet daily  Get COVID vaccine in 2 weeks  Continue Vitamin D  Use Oral chelated iron 18 mcg daily  We will set you up for IV Iron infusions at [facility], for questions call Beth, Dr [facility] nurse, she will be setting this up for you  Follow up in 3 months  If you are age 54 or older, your body mass index should be between 23-30. Your Body mass index is 20.34 kg/m. If this is out of the aforementioned range listed, please consider follow up with your Primary Care Provider.  If you are age 71 or younger, your body mass index should be between 19-25. Your Body mass index is 20.34 kg/m. If this is out of the aformentioned range listed, please consider follow up with your Primary Care Provider.   ________________________________________________________  The [place] [facility] providers would like to encourage you to use [facility] to communicate with providers for non-urgent requests or questions.  Due to long hold times on the telephone, sending your provider a message by [facility] may be a faster and more efficient way to get a response.  Please allow 48 business hours for a response.  Please remember that this is for non-urgent requests.  _______________________________________________________   I appreciate the  opportunity to care for you  Thank You   [facility] , MD

## 2021-04-02 ENCOUNTER — Telehealth: Payer: Self-pay

## 2021-04-02 ENCOUNTER — Other Ambulatory Visit: Payer: Self-pay

## 2021-04-02 MED ORDER — SODIUM CHLORIDE 0.9 % IV SOLN: INTRAVENOUS | 0 refills | Status: DC

## 2021-04-02 NOTE — Telephone Encounter (Signed)
[facility] does not infuse Feraheme. Called the patient to discuss the option of [facility] or [facility]. No answer. Left her a message to call me back.

## 2021-04-03 ENCOUNTER — Other Ambulatory Visit: Payer: Self-pay

## 2021-04-03 ENCOUNTER — Telehealth: Payer: Self-pay | Admitting: Pharmacy Technician

## 2021-04-03 ENCOUNTER — Encounter: Payer: Self-pay | Admitting: Gastroenterology

## 2021-04-03 DIAGNOSIS — D508 Other iron deficiency anemias: Secondary | ICD-10-CM

## 2021-04-03 DIAGNOSIS — D509 Iron deficiency anemia, unspecified: Secondary | ICD-10-CM | POA: Insufficient documentation

## 2021-04-03 NOTE — Telephone Encounter (Signed)
Dr. Lavon Paganini,  Lorain Childes note: Auth Submission: DENIED Payer: [employer] Medication & CPT/J Code(s) submitted: Feraheme (ferumoxytol) [phone] Route of submission (phone, fax, portal): PORTAL Auth type: Buy/Bill  Denied due to patient has not tried and or failed step therapy. Venofer Infed Ferrlecit.  Would you like to try venofer?? Please advise.

## 2021-04-03 NOTE — Telephone Encounter (Signed)
New order entered. Please let me know if it is incorrect.  Thank you for your help.

## 2021-04-03 NOTE — Telephone Encounter (Signed)
Spoke with the patient. She is agreeable to going to the [facility] on [address]. Orders entered into Epic.

## 2021-04-03 NOTE — Telephone Encounter (Signed)
Dr. Lavon Paganini, Please re-enter the order for Venofer and we will schedule the patient as soon as possible. Thanks Selena Batten

## 2021-04-03 NOTE — Telephone Encounter (Signed)
Orders are correct.  Ty kim

## 2021-04-20 ENCOUNTER — Ambulatory Visit (INDEPENDENT_AMBULATORY_CARE_PROVIDER_SITE_OTHER): Payer: No Typology Code available for payment source

## 2021-04-20 ENCOUNTER — Other Ambulatory Visit: Payer: Self-pay

## 2021-04-20 VITALS — BP 151/100 | HR 61 | Temp 98.1°F | Resp 16 | Ht 63.0 in | Wt 116.6 lb

## 2021-04-20 DIAGNOSIS — D508 Other iron deficiency anemias: Secondary | ICD-10-CM

## 2021-04-20 DIAGNOSIS — E559 Vitamin D deficiency, unspecified: Secondary | ICD-10-CM

## 2021-04-20 DIAGNOSIS — K50118 Crohn's disease of large intestine with other complication: Secondary | ICD-10-CM | POA: Diagnosis not present

## 2021-04-20 MED ORDER — DIPHENHYDRAMINE HCL 50 MG/ML IJ SOLN: 50.0000 mg | Freq: Once | INTRAMUSCULAR | Status: DC | PRN

## 2021-04-20 MED ORDER — SODIUM CHLORIDE 0.9 % IV SOLN
200.0000 mg | Freq: Once | INTRAVENOUS | Status: AC
  Administered 2021-04-20: 200 mg via INTRAVENOUS
  Filled 2021-04-20: qty 10

## 2021-04-20 MED ORDER — METHYLPREDNISOLONE SODIUM SUCC 125 MG IJ SOLR: 125.0000 mg | Freq: Once | INTRAMUSCULAR | Status: DC | PRN

## 2021-04-20 MED ORDER — SODIUM CHLORIDE 0.9 % IV SOLN: Freq: Once | INTRAVENOUS | Status: DC | PRN

## 2021-04-20 MED ORDER — FAMOTIDINE IN NACL 20-0.9 MG/50ML-% IV SOLN: 20.0000 mg | Freq: Once | INTRAVENOUS | Status: DC | PRN

## 2021-04-20 MED ORDER — ALBUTEROL SULFATE HFA 108 (90 BASE) MCG/ACT IN AERS: 2.0000 | INHALATION_SPRAY | Freq: Once | RESPIRATORY_TRACT | Status: DC | PRN

## 2021-04-20 MED ORDER — EPINEPHRINE 0.3 MG/0.3ML IJ SOAJ: 0.3000 mg | Freq: Once | INTRAMUSCULAR | Status: DC | PRN

## 2021-04-20 NOTE — Patient Instructions (Signed)

## 2021-04-20 NOTE — Progress Notes (Signed)
Diagnosis: Iron Deficiency Anemia  Provider:  Chilton Greathouse, MD  Procedure: Infusion  IV Type: Peripheral, IV Location: L Forearm  Venofer (Iron Sucrose), Dose: 200 mg  Infusion Start Time: 0940  Infusion Stop Time: 1000  Post Infusion IV Care: Observation period completed and Peripheral IV Discontinued  Discharge: Condition: Good, Destination: Home . AVS provided to patient.   Performed by:  Nat Math, RN

## 2021-04-22 ENCOUNTER — Other Ambulatory Visit: Payer: Self-pay

## 2021-04-22 ENCOUNTER — Ambulatory Visit (INDEPENDENT_AMBULATORY_CARE_PROVIDER_SITE_OTHER): Payer: No Typology Code available for payment source | Admitting: *Deleted

## 2021-04-22 VITALS — BP 130/68 | HR 61 | Temp 97.8°F | Resp 16 | Ht 63.0 in | Wt 115.8 lb

## 2021-04-22 DIAGNOSIS — D508 Other iron deficiency anemias: Secondary | ICD-10-CM

## 2021-04-22 DIAGNOSIS — E559 Vitamin D deficiency, unspecified: Secondary | ICD-10-CM | POA: Diagnosis not present

## 2021-04-22 DIAGNOSIS — K50118 Crohn's disease of large intestine with other complication: Secondary | ICD-10-CM | POA: Diagnosis not present

## 2021-04-22 MED ORDER — ALBUTEROL SULFATE HFA 108 (90 BASE) MCG/ACT IN AERS: 2.0000 | INHALATION_SPRAY | Freq: Once | RESPIRATORY_TRACT | Status: DC | PRN

## 2021-04-22 MED ORDER — METHYLPREDNISOLONE SODIUM SUCC 125 MG IJ SOLR: 125.0000 mg | Freq: Once | INTRAMUSCULAR | Status: DC | PRN

## 2021-04-22 MED ORDER — DIPHENHYDRAMINE HCL 50 MG/ML IJ SOLN: 50.0000 mg | Freq: Once | INTRAMUSCULAR | Status: DC | PRN

## 2021-04-22 MED ORDER — FAMOTIDINE IN NACL 20-0.9 MG/50ML-% IV SOLN: 20.0000 mg | Freq: Once | INTRAVENOUS | Status: DC | PRN

## 2021-04-22 MED ORDER — SODIUM CHLORIDE 0.9 % IV SOLN
200.0000 mg | Freq: Once | INTRAVENOUS | Status: AC
  Administered 2021-04-22: 200 mg via INTRAVENOUS
  Filled 2021-04-22: qty 10

## 2021-04-22 MED ORDER — EPINEPHRINE 0.3 MG/0.3ML IJ SOAJ: 0.3000 mg | Freq: Once | INTRAMUSCULAR | Status: DC | PRN

## 2021-04-22 MED ORDER — SODIUM CHLORIDE 0.9 % IV SOLN: Freq: Once | INTRAVENOUS | Status: DC | PRN

## 2021-04-22 NOTE — Progress Notes (Signed)
Diagnosis: Iron Deficiency Anemia  Provider:  Chilton Greathouse, MD  Procedure: Infusion  IV Type: Peripheral, IV Location: L Forearm  Venofer (Iron Sucrose), Dose: 200 mg  Infusion Start Time: 0909 am  Infusion Stop Time: 0928 am  Post Infusion IV Care: Observation period completed and Peripheral IV Discontinued  Discharge: Condition: Good, Destination: Home . AVS provided to patient.   Performed by:  Forrest Moron, RN

## 2021-04-24 ENCOUNTER — Other Ambulatory Visit: Payer: Self-pay

## 2021-04-24 ENCOUNTER — Ambulatory Visit (INDEPENDENT_AMBULATORY_CARE_PROVIDER_SITE_OTHER): Payer: No Typology Code available for payment source

## 2021-04-24 VITALS — BP 137/84 | HR 71 | Temp 97.8°F | Resp 18 | Ht 63.0 in | Wt 117.2 lb

## 2021-04-24 DIAGNOSIS — K50118 Crohn's disease of large intestine with other complication: Secondary | ICD-10-CM

## 2021-04-24 DIAGNOSIS — E559 Vitamin D deficiency, unspecified: Secondary | ICD-10-CM | POA: Diagnosis not present

## 2021-04-24 DIAGNOSIS — D508 Other iron deficiency anemias: Secondary | ICD-10-CM

## 2021-04-24 MED ORDER — ALBUTEROL SULFATE HFA 108 (90 BASE) MCG/ACT IN AERS: 2.0000 | INHALATION_SPRAY | Freq: Once | RESPIRATORY_TRACT | Status: DC | PRN

## 2021-04-24 MED ORDER — METHYLPREDNISOLONE SODIUM SUCC 125 MG IJ SOLR: 125.0000 mg | Freq: Once | INTRAMUSCULAR | Status: DC | PRN

## 2021-04-24 MED ORDER — FAMOTIDINE IN NACL 20-0.9 MG/50ML-% IV SOLN: 20.0000 mg | Freq: Once | INTRAVENOUS | Status: DC | PRN

## 2021-04-24 MED ORDER — DIPHENHYDRAMINE HCL 50 MG/ML IJ SOLN: 50.0000 mg | Freq: Once | INTRAMUSCULAR | Status: DC | PRN

## 2021-04-24 MED ORDER — SODIUM CHLORIDE 0.9 % IV SOLN
200.0000 mg | Freq: Once | INTRAVENOUS | Status: AC
  Administered 2021-04-24: 200 mg via INTRAVENOUS
  Filled 2021-04-24: qty 10

## 2021-04-24 MED ORDER — SODIUM CHLORIDE 0.9 % IV SOLN: Freq: Once | INTRAVENOUS | Status: DC | PRN

## 2021-04-24 MED ORDER — EPINEPHRINE 0.3 MG/0.3ML IJ SOAJ: 0.3000 mg | Freq: Once | INTRAMUSCULAR | Status: DC | PRN

## 2021-04-24 NOTE — Progress Notes (Signed)
Diagnosis: Crohn's Disease  Provider:  Marshell Garfinkel, MD  Procedure: Infusion  IV Type: Peripheral, IV Location: R Forearm  Venofer (Iron Sucrose), Dose: 200 mg  Infusion Start Time: 08.55  04/24/2021  Infusion Stop Time: 09.16 04/24/2021  Post Infusion IV Care: Peripheral IV Discontinued  Discharge: Condition: Good, Destination: Home . AVS provided to patient.   Performed by:  Arnoldo Morale, RN

## 2021-04-27 ENCOUNTER — Other Ambulatory Visit: Payer: Self-pay

## 2021-04-27 ENCOUNTER — Ambulatory Visit (INDEPENDENT_AMBULATORY_CARE_PROVIDER_SITE_OTHER): Payer: No Typology Code available for payment source | Admitting: *Deleted

## 2021-04-27 VITALS — BP 129/77 | HR 70 | Temp 98.4°F | Resp 16 | Ht 63.0 in | Wt 114.8 lb

## 2021-04-27 DIAGNOSIS — E559 Vitamin D deficiency, unspecified: Secondary | ICD-10-CM

## 2021-04-27 DIAGNOSIS — D508 Other iron deficiency anemias: Secondary | ICD-10-CM | POA: Diagnosis not present

## 2021-04-27 DIAGNOSIS — K50118 Crohn's disease of large intestine with other complication: Secondary | ICD-10-CM | POA: Diagnosis not present

## 2021-04-27 MED ORDER — EPINEPHRINE 0.3 MG/0.3ML IJ SOAJ: 0.3000 mg | Freq: Once | INTRAMUSCULAR | Status: DC | PRN

## 2021-04-27 MED ORDER — METHYLPREDNISOLONE SODIUM SUCC 125 MG IJ SOLR: 125.0000 mg | Freq: Once | INTRAMUSCULAR | Status: DC | PRN

## 2021-04-27 MED ORDER — FAMOTIDINE IN NACL 20-0.9 MG/50ML-% IV SOLN: 20.0000 mg | Freq: Once | INTRAVENOUS | Status: DC | PRN

## 2021-04-27 MED ORDER — ALBUTEROL SULFATE HFA 108 (90 BASE) MCG/ACT IN AERS: 2.0000 | INHALATION_SPRAY | Freq: Once | RESPIRATORY_TRACT | Status: DC | PRN

## 2021-04-27 MED ORDER — SODIUM CHLORIDE 0.9 % IV SOLN
200.0000 mg | Freq: Once | INTRAVENOUS | Status: AC
  Administered 2021-04-27: 200 mg via INTRAVENOUS
  Filled 2021-04-27: qty 10

## 2021-04-27 MED ORDER — DIPHENHYDRAMINE HCL 50 MG/ML IJ SOLN: 50.0000 mg | Freq: Once | INTRAMUSCULAR | Status: DC | PRN

## 2021-04-27 MED ORDER — SODIUM CHLORIDE 0.9 % IV SOLN: Freq: Once | INTRAVENOUS | Status: DC | PRN

## 2021-04-27 NOTE — Progress Notes (Signed)
Diagnosis: Iron Deficiency Anemia  Provider:  Chilton Greathouse, MD  Procedure: Infusion  IV Type: Peripheral, IV Location: L Antecubital  Venofer (Iron Sucrose), Dose: 200 mg  Infusion Start Time: 0857 am  Infusion Stop Time: 0917 am  Post Infusion IV Care: Observation period completed  Discharge: Condition: Good, Destination: Home . AVS provided to patient.   Performed by:  Forrest Moron, RN

## 2021-04-29 ENCOUNTER — Other Ambulatory Visit: Payer: Self-pay

## 2021-04-29 ENCOUNTER — Ambulatory Visit (INDEPENDENT_AMBULATORY_CARE_PROVIDER_SITE_OTHER): Payer: No Typology Code available for payment source | Admitting: *Deleted

## 2021-04-29 VITALS — BP 120/80 | HR 67 | Temp 98.7°F | Resp 18 | Ht 63.0 in | Wt 117.8 lb

## 2021-04-29 DIAGNOSIS — E559 Vitamin D deficiency, unspecified: Secondary | ICD-10-CM

## 2021-04-29 DIAGNOSIS — D508 Other iron deficiency anemias: Secondary | ICD-10-CM | POA: Diagnosis not present

## 2021-04-29 DIAGNOSIS — K50118 Crohn's disease of large intestine with other complication: Secondary | ICD-10-CM | POA: Diagnosis not present

## 2021-04-29 MED ORDER — DIPHENHYDRAMINE HCL 50 MG/ML IJ SOLN: 50.0000 mg | Freq: Once | INTRAMUSCULAR | Status: DC | PRN

## 2021-04-29 MED ORDER — METHYLPREDNISOLONE SODIUM SUCC 125 MG IJ SOLR: 125.0000 mg | Freq: Once | INTRAMUSCULAR | Status: DC | PRN

## 2021-04-29 MED ORDER — EPINEPHRINE 0.3 MG/0.3ML IJ SOAJ: 0.3000 mg | Freq: Once | INTRAMUSCULAR | Status: DC | PRN

## 2021-04-29 MED ORDER — SODIUM CHLORIDE 0.9 % IV SOLN: Freq: Once | INTRAVENOUS | Status: DC | PRN

## 2021-04-29 MED ORDER — FAMOTIDINE IN NACL 20-0.9 MG/50ML-% IV SOLN: 20.0000 mg | Freq: Once | INTRAVENOUS | Status: DC | PRN

## 2021-04-29 MED ORDER — SODIUM CHLORIDE 0.9 % IV SOLN
200.0000 mg | Freq: Once | INTRAVENOUS | Status: AC
  Administered 2021-04-29: 200 mg via INTRAVENOUS
  Filled 2021-04-29: qty 10

## 2021-04-29 MED ORDER — ALBUTEROL SULFATE HFA 108 (90 BASE) MCG/ACT IN AERS: 2.0000 | INHALATION_SPRAY | Freq: Once | RESPIRATORY_TRACT | Status: DC | PRN

## 2021-04-29 NOTE — Progress Notes (Signed)
Diagnosis: Iron Deficiency Anemia  Provider:  Chilton Greathouse, MD  Procedure: Infusion  IV Type: Peripheral, IV Location: L Forearm  Venofer (Iron Sucrose), Dose: 200 mg  Infusion Start Time: 0850  Infusion Stop Time: 0906  Post Infusion IV Care: Peripheral IV Discontinued  Discharge: Condition: Good, Destination: Home . AVS provided to patient.   Performed by:  Nat Math, RN

## 2021-05-04 ENCOUNTER — Other Ambulatory Visit: Payer: Self-pay | Admitting: Family Medicine

## 2021-06-25 ENCOUNTER — Other Ambulatory Visit: Payer: Self-pay

## 2021-06-30 ENCOUNTER — Encounter: Payer: Self-pay | Admitting: Gastroenterology

## 2021-07-02 ENCOUNTER — Other Ambulatory Visit: Payer: Self-pay | Admitting: Pharmacy Technician

## 2021-07-22 ENCOUNTER — Other Ambulatory Visit: Payer: Self-pay | Admitting: Family Medicine

## 2021-10-13 ENCOUNTER — Other Ambulatory Visit: Payer: Self-pay | Admitting: Family Medicine

## 2021-10-20 ENCOUNTER — Other Ambulatory Visit (INDEPENDENT_AMBULATORY_CARE_PROVIDER_SITE_OTHER): Payer: No Typology Code available for payment source

## 2021-10-20 ENCOUNTER — Encounter: Payer: Self-pay | Admitting: Gastroenterology

## 2021-10-20 ENCOUNTER — Ambulatory Visit: Payer: No Typology Code available for payment source | Admitting: Gastroenterology

## 2021-10-20 VITALS — BP 120/66 | HR 70 | Ht 63.0 in | Wt 115.0 lb

## 2021-10-20 DIAGNOSIS — K5080 Crohn's disease of both small and large intestine without complications: Secondary | ICD-10-CM | POA: Diagnosis not present

## 2021-10-20 DIAGNOSIS — E538 Deficiency of other specified B group vitamins: Secondary | ICD-10-CM

## 2021-10-20 DIAGNOSIS — D500 Iron deficiency anemia secondary to blood loss (chronic): Secondary | ICD-10-CM

## 2021-10-20 DIAGNOSIS — E559 Vitamin D deficiency, unspecified: Secondary | ICD-10-CM

## 2021-10-20 DIAGNOSIS — R1031 Right lower quadrant pain: Secondary | ICD-10-CM

## 2021-10-20 LAB — IBC + FERRITIN
Ferritin: 5.1 ng/mL — ABNORMAL LOW (ref 10.0–291.0)
Iron: 45 ug/dL (ref 42–145)
Saturation Ratios: 7.9 % — ABNORMAL LOW (ref 20.0–50.0)
TIBC: 569.8 ug/dL — ABNORMAL HIGH (ref 250.0–450.0)
Transferrin: 407 mg/dL — ABNORMAL HIGH (ref 212.0–360.0)

## 2021-10-20 LAB — COMPREHENSIVE METABOLIC PANEL
ALT: 11 U/L (ref 0–35)
AST: 15 U/L (ref 0–37)
Albumin: 4 g/dL (ref 3.5–5.2)
Alkaline Phosphatase: 78 U/L (ref 39–117)
BUN: 12 mg/dL (ref 6–23)
CO2: 28 mEq/L (ref 19–32)
Calcium: 9 mg/dL (ref 8.4–10.5)
Chloride: 104 mEq/L (ref 96–112)
Creatinine, Ser: 0.61 mg/dL (ref 0.40–1.20)
GFR: 101.78 mL/min (ref >60.00)
Glucose, Bld: 97 mg/dL (ref 70–99)
Potassium: 4.2 mEq/L (ref 3.5–5.1)
Sodium: 139 mEq/L (ref 135–145)
Total Bilirubin: 1.2 mg/dL (ref 0.2–1.2)
Total Protein: 7.1 g/dL (ref 6.0–8.3)

## 2021-10-20 LAB — CBC WITH DIFFERENTIAL/PLATELET
Basophils Absolute: 0 10*3/uL (ref 0.0–0.1)
Basophils Relative: 0.6 % (ref 0.0–3.0)
Eosinophils Absolute: 0.1 10*3/uL (ref 0.0–0.7)
Eosinophils Relative: 1.4 % (ref 0.0–5.0)
HCT: 41.9 % (ref 36.0–46.0)
Hemoglobin: 13.9 g/dL (ref 12.0–15.0)
Lymphocytes Relative: 35.7 % (ref 12.0–46.0)
Lymphs Abs: 2.5 10*3/uL (ref 0.7–4.0)
MCHC: 33.2 g/dL (ref 30.0–36.0)
MCV: 86.2 fl (ref 78.0–100.0)
Monocytes Absolute: 0.6 10*3/uL (ref 0.1–1.0)
Monocytes Relative: 8.3 % (ref 3.0–12.0)
Neutro Abs: 3.8 10*3/uL (ref 1.4–7.7)
Neutrophils Relative %: 54 % (ref 43.0–77.0)
Platelets: 359 10*3/uL (ref 150.0–400.0)
RBC: 4.87 Mil/uL (ref 3.87–5.11)
RDW: 13.2 % (ref 11.5–15.5)
WBC: 7.1 10*3/uL (ref 4.0–10.5)

## 2021-10-20 LAB — SEDIMENTATION RATE: Sed Rate: 15 mm/hr (ref 0–30)

## 2021-10-20 LAB — B12 AND FOLATE PANEL
Folate: 15.4 ng/mL (ref >5.9)
Vitamin B-12: 649 pg/mL (ref 211–911)

## 2021-10-20 LAB — HIGH SENSITIVITY CRP: CRP, High Sensitivity: 0.66 mg/L (ref 0.000–5.000)

## 2021-10-20 LAB — VITAMIN D 25 HYDROXY (VIT D DEFICIENCY, FRACTURES): VITD: 15.42 ng/mL — ABNORMAL LOW (ref 30.00–100.00)

## 2021-10-20 NOTE — Patient Instructions (Signed)
Your provider has requested that you go to the basement level for lab work before leaving today. Press "B" on the elevator. The lab is located at the first door on the left as you exit the elevator.   Due to recent changes in healthcare laws, you may see the results of your imaging and laboratory studies on MyChart before your provider has had a chance to review them.  We understand that in some cases there may be results that are confusing or concerning to you. Not all laboratory results come back in the same time frame and the provider may be waiting for multiple results in order to interpret others.  Please give [place] 48 hours in order for your provider to thoroughly review all the results before contacting the office for clarification of your results.    If you are age 50 or older, your body mass index should be between 23-30. Your Body mass index is 20.37 kg/m. If this is out of the aforementioned range listed, please consider follow up with your Primary Care Provider.  If you are age 81 or younger, your body mass index should be between 19-25. Your Body mass index is 20.37 kg/m. If this is out of the aformentioned range listed, please consider follow up with your Primary Care Provider.   ________________________________________________________  The [place] [facility] providers would like to encourage you to use [facility] to communicate with providers for non-urgent requests or questions.  Due to long hold times on the telephone, sending your provider a message by [facility] may be a faster and more efficient way to get a response.  Please allow 48 business hours for a response.  Please remember that this is for non-urgent requests.  _______________________________________________________   I appreciate the  opportunity to care for you  Thank You   Harl Bowie , MD

## 2021-10-20 NOTE — Progress Notes (Signed)
Karen Knox    [id number]    10-19-1965  Primary Care Physician:Tabori, Aundra Millet, MD  Referring Physician: Midge Minium, MD [address] [place],  [place] [address]   Chief complaint: Crohn's disease, Anemia  HPI:  56 year old very pleasant female with longstanding history of Crohn's ileitis, s/p terminal ileal resection with ileocolonic anastomosis here for follow-up visit for  Crohn's disease  She received IV iron infusion.  She continues to feel tired.  She has intermittent right lower quadrant abdominal discomfort.  She is currently off budesonide. She is currently on Remicade infusion every 6 weeks, November 2022  Remicade antibody level was undetectable and drug trough 20.   Denies any rectal bleeding.  No weight loss    GI history: She had an acute flare of Crohn's disease in January 2022, was initially treated with prednisone taper and followed by budesonide taper dose.  She did not tolerate 6-MP, stopped it after 3 weeks.    Colonoscopy April 18, 2020: - Patent end-to-side ileo-colonic anastomosis, characterized by ulceration. - A few ulcers in the terminal ileum. Biopsied. - Normal mucosa in the entire examined colon. - The examination was otherwise normal.   Surgical [P], small bowel, terminal ileum ulcers - SEVERELY ACTIVE CHRONIC ILEITIS WITH ULCERATION, CONSISTENT WITH PATIENT'S CLINICAL HISTORY OF CROHN'S DISEASE - NEGATIVE FOR GRANULOMAS OR DYSPLASIA - VIRAL CYTOPATHIC CHANGES ARE NOT SEENSurgical [P], small bowel, terminal ileum ulcers - SEVERELY ACTIVE CHRONIC ILEITIS WITH ULCERATION, CONSISTENT WITH PATIENT'S CLINICAL HISTORY OF CROHN'S DISEASE - NEGATIVE FOR GRANULOMAS OR DYSPLASIA - VIRAL CYTOPATHIC CHANGES ARE NOT SEEN    Colonoscopy July 2018 showed terminal ileal stricture with erosions and friable tissue, biopsies showed evidence of active ileitis.     After colonoscopy she was treated with Entocort for 30 days  with improvement of symptoms.   October 07, 2016: Infliximab  drug trough of 21 with undetectable antibody.  She was getting infliximab infusions every 6 weeks   Repeat on April 19, 2017 :infliximab drug trough was 11 with undetectable antibodies    Colonoscopy 04/18/20: - Patent end-to-side ileo-colonic anastomosis, characteri zed by ulceration. - A few ulcers in the terminal ileum. Biopsied. - Normal mucosa in the entire examined colon. - The examination was otherwise normal.   Surgical [P], small bowel, terminal ileum ulcers - SEVERELY ACTIVE CHRONIC ILEITIS WITH ULCERATION, CONSISTENT WITH PATIENT'S CLINICAL HISTORY OF CROHN'S DISEASE - NEGATIVE FOR GRANULOMAS OR DYSPLASIA - VIRAL CYTOPATHIC CHANGES ARE NOT SEEN     Outpatient Encounter Medications as of 10/20/2021  Medication Sig   cholestyramine light (PREVALITE) 4 GM/DOSE powder Take 1 packet (4 g total) by mouth daily. (Patient taking differently: Take 4 g by mouth daily as needed.)   cyanocobalamin (,VITAMIN B-12,) 1000 MCG/ML injection INJECT 1 ML (1,000 MCG TOTAL) INTO THE MUSCLE EVERY 30 (THIRTY) DAYS.   inFLIXimab (REMICADE) 100 MG injection Inject 500 mg into the vein every 6 (six) weeks.   Vitamin D, Ergocalciferol, (DRISDOL) 1.25 MG (50000 UNIT) CAPS capsule TAKE 1 CAPSULE (50,000 UNITS TOTAL) BY MOUTH EVERY 7 (SEVEN) DAYS   [DISCONTINUED] ferumoxytol in sodium chloride 0.9 % 100 mL Administer Feraheme via infusion per protocol x 2   [DISCONTINUED] budesonide (ENTOCORT EC) 3 MG 24 hr capsule Take 3 capsules (9 mg total) by mouth daily.   No facility-administered encounter medications on file as of 10/20/2021.    Allergies as of 10/20/2021   (No  Known Allergies)    Past Medical History:  Diagnosis Date   Anal fissure    Arthritis    ? IBD related?   B12 deficiency    Crohn's disease ([place])    External hemorrhoids    Gilbert's syndrome    Internal hemorrhoids    Iron deficiency anemia    Small bowel stricture  ([place])    multiple 07/05/00   TB (tuberculosis)    "latent"- was treated because pt was on Remicade; October 2016    Past Surgical History:  Procedure Laterality Date   APPENDECTOMY     with initial colon resection including terminal ileum   CHOLECYSTECTOMY     COLON RESECTION  1993, 2002   x2   COLONOSCOPY     EXPLORATORY LAPAROTOMY  2002   with extensive lysis of adhesions, resection of ileocolonic anastomosis,resection segment small bowel; anastomosis of small bowel  to small bowel; ileocolic anastomosis   UPPER GASTROINTESTINAL ENDOSCOPY      Family History  Problem Relation Age of Onset   Melanoma Mother    Breast cancer Mother 46   Arthritis Father        and on mothers side   Multiple sclerosis Father    Colon cancer Father 34   Lung cancer Father        smoker   Esophageal cancer Neg Hx    Rectal cancer Neg Hx    Stomach cancer Neg Hx     Social History   Socioeconomic History   Marital status: Married    Spouse name: tom   Number of children: 2   Years of education: Not on file   Highest education level: Not on file  Occupational History   Occupation: [employer]   Tobacco Use   Smoking status: Never   Smokeless tobacco: Never  Vaping Use   Vaping Use: Never used  Substance and Sexual Activity   Alcohol use: Yes    Comment: occasional   Drug use: No   Sexual activity: Yes    Birth control/protection: Other-see comments  Other Topics Concern   Not on file  Social History Narrative   Not on file   Social Determinants of Health   Financial Resource Strain: Not on file  Food Insecurity: Not on file  Transportation Needs: Not on file  Physical Activity: Not on file  Stress: Not on file  Social Connections: Not on file  Intimate Partner Violence: Not on file      Review of systems: All other review of systems negative except as mentioned in the HPI.   Physical Exam: Vitals:   10/20/21 0931  BP: 120/66  Pulse: 70   Body mass index  is 20.37 kg/m. Gen:      No acute distress HEENT:  sclera anicteric Abd:      soft, non-tender; no palpable masses, no distension Ext:    No edema Neuro: alert and oriented x 3 Psych: normal mood and affect  Data Reviewed:  Reviewed labs, radiology imaging, old records and pertinent past GI work up   Assessment and Plan/Recommendations:  56 year old very pleasant female with history of Crohn's ileitis s/p terminal ileal resection with ileocolonic anastomosis with acute Crohn's flare, ileitis with active inflammation in Jan 2022  She continues to have intermittent right lower quadrant abdominal discomfort.  We will check CRP and fecal calprotectin If elevated inflammatory markers, will consider switching therapy to [employer] Continue Remicade infusion every 6 weeks    IBD health maintenance:  Check TB QuantiFERON gold, CBC, CMP, iron panel, B12, folate and vitamin D   Continue vitamin D, B12 supplements for vitamin D and B12 deficiency. May need additional IV iron infusion if has persistent iron deficiency    Return in 2-3 months or sooner if needed  This visit required 40 minutes of patient care (this includes precharting, chart review, review of results, face-to-face time used for counseling as well as treatment plan and follow-up. The patient was provided an opportunity to ask questions and all were answered. The patient agreed with the plan and demonstrated an understanding of the instructions.  Damaris Hippo , MD    CC: Midge Minium, MD

## 2021-10-23 LAB — QUANTIFERON-TB GOLD PLUS
Mitogen-NIL: >10 IU/mL
NIL: 1.74 IU/mL
QuantiFERON-TB Gold Plus: POSITIVE — AB
TB1-NIL: 0.6 IU/mL
TB2-NIL: 1.42 IU/mL

## 2021-11-11 ENCOUNTER — Encounter: Payer: Self-pay | Admitting: Physician Assistant

## 2021-11-11 ENCOUNTER — Ambulatory Visit: Payer: No Typology Code available for payment source | Admitting: Physician Assistant

## 2021-11-11 VITALS — BP 122/80 | HR 61 | Temp 97.8°F | Ht 63.0 in | Wt 116.0 lb

## 2021-11-11 DIAGNOSIS — H9201 Otalgia, right ear: Secondary | ICD-10-CM

## 2021-11-11 MED ORDER — OFLOXACIN 0.3 % OT SOLN: 5.0000 [drp] | Freq: Every day | OTIC | 0 refills | Status: DC

## 2021-11-11 MED ORDER — PREDNISONE 20 MG PO TABS
20.0000 mg | ORAL_TABLET | Freq: Every day | ORAL | 0 refills | Status: DC
Start: 2021-11-11 — End: 2021-12-03

## 2021-11-11 NOTE — Progress Notes (Signed)
Karen Knox is a 56 y.o. female here for a new problem.  History of Present Illness:   Chief Complaint  Patient presents with   Otalgia    Pt c/o right ear pain x 2 days. Took Tylenol last night to sleep. Denies fever or chills, no drainage.    HPI  Right ear pain For the past 2 days she has had swelling in her ear canal and pain of her outer ear.  She has taken Tylenol without much improvement of her symptoms.  She has ulcerative colitis and is unable to take NSAIDs.  She denies fever, chills, recent URI.  She feels like this all started when she was trying to use her husband's headphones.  Past Medical History:  Diagnosis Date   Anal fissure    Arthritis    ? IBD related?   B12 deficiency    Crohn's disease ([place])    External hemorrhoids    Gilbert's syndrome    Internal hemorrhoids    Iron deficiency anemia    Small bowel stricture ([place])    multiple 07/05/00   TB (tuberculosis)    "latent"- was treated because pt was on Remicade; October 2016     Social History   Tobacco Use   Smoking status: Never   Smokeless tobacco: Never  Vaping Use   Vaping Use: Never used  Substance Use Topics   Alcohol use: Yes    Comment: occasional   Drug use: No    Past Surgical History:  Procedure Laterality Date   APPENDECTOMY     with initial colon resection including terminal ileum   CHOLECYSTECTOMY     COLON RESECTION  1993, 2002   x2   COLONOSCOPY     EXPLORATORY LAPAROTOMY  2002   with extensive lysis of adhesions, resection of ileocolonic anastomosis,resection segment small bowel; anastomosis of small bowel  to small bowel; ileocolic anastomosis   UPPER GASTROINTESTINAL ENDOSCOPY      Family History  Problem Relation Age of Onset   Melanoma Mother    Breast cancer Mother 74   Arthritis Father        and on mothers side   Multiple sclerosis Father    Colon cancer Father 1   Lung cancer Father        smoker   Esophageal cancer Neg Hx    Rectal cancer Neg Hx     Stomach cancer Neg Hx     No Known Allergies  Current Medications:   Current Outpatient Medications:    cholestyramine light (PREVALITE) 4 GM/DOSE powder, Take 1 packet (4 g total) by mouth daily. (Patient taking differently: Take 4 g by mouth daily as needed.), Disp: 120 packet, Rfl: 3   clobetasol (TEMOVATE) 0.05 % external solution, APPLY TO AFFECTED AREA TWICE A DAY AS NEEDED FOR RASH ON BODY DO NOT USE ON FACE OR GROIN AREA, Disp: , Rfl:    cyanocobalamin (,VITAMIN B-12,) 1000 MCG/ML injection, INJECT 1 ML (1,000 MCG TOTAL) INTO THE MUSCLE EVERY 30 (THIRTY) DAYS., Disp: 10 mL, Rfl: 6   inFLIXimab (REMICADE) 100 MG injection, Inject 500 mg into the vein every 6 (six) weeks., Disp: 5 each, Rfl: 6   ofloxacin (FLOXIN OTIC) 0.3 % OTIC solution, Place 5 drops into the right ear daily., Disp: 5 mL, Rfl: 0   predniSONE (DELTASONE) 20 MG tablet, Take 1 tablet (20 mg total) by mouth daily with breakfast., Disp: 5 tablet, Rfl: 0   Vitamin D, Ergocalciferol, (DRISDOL) 1.25 MG (  50000 UNIT) CAPS capsule, TAKE 1 CAPSULE (50,000 UNITS TOTAL) BY MOUTH EVERY 7 (SEVEN) DAYS, Disp: 12 capsule, Rfl: 0   Review of Systems:   ROS Negative unless otherwise specified per HPI.  Vitals:   Vitals:   11/11/21 1352  BP: 122/80  Pulse: 61  Temp: 97.8 F (36.6 C)  TempSrc: Temporal  SpO2: 96%  Weight: 116 lb (52.6 kg)  Height: 5' 3"  (1.6 m)     Body mass index is 20.55 kg/m.  Physical Exam:   Physical Exam Vitals and nursing note reviewed.  Constitutional:      General: She is not in acute distress.    Appearance: She is well-developed. She is not ill-appearing or toxic-appearing.  HENT:     Head: Normocephalic and atraumatic.     Right Ear: Tympanic membrane normal. Swelling and tenderness present.     Left Ear: Tympanic membrane, ear canal and external ear normal. Tympanic membrane is not erythematous, retracted or bulging.     Ears:     Comments: Difficult to evaluate right TM due to  swelling of ear canal    Nose: Nose normal.     Right Sinus: No maxillary sinus tenderness or frontal sinus tenderness.     Left Sinus: No maxillary sinus tenderness or frontal sinus tenderness.     Mouth/Throat:     Pharynx: Uvula midline. No posterior oropharyngeal erythema.  Eyes:     General: Lids are normal.     Conjunctiva/sclera: Conjunctivae normal.  Neck:     Trachea: Trachea normal.  Cardiovascular:     Rate and Rhythm: Normal rate and regular rhythm.     Heart sounds: Normal heart sounds, S1 normal and S2 normal.  Pulmonary:     Effort: Pulmonary effort is normal.     Breath sounds: Normal breath sounds. No decreased breath sounds, wheezing, rhonchi or rales.  Lymphadenopathy:     Cervical: No cervical adenopathy.  Skin:    General: Skin is warm and dry.  Neurological:     Mental Status: She is alert.  Psychiatric:        Speech: Speech normal.        Behavior: Behavior normal. Behavior is cooperative.     Assessment and Plan:   Otalgia, right No red flags on exam Suspect otitis externa Will trial ofloxacin antibiotic drops Unable to tolerate NSAIDs due to ulcerative colitis, so we will start prednisone for swelling of the ear Discussed that if she develops new or worsening symptoms to be reevaluated in office for further evaluation  Inda Coke, PA-C

## 2021-11-13 ENCOUNTER — Other Ambulatory Visit: Payer: Self-pay | Admitting: Gastroenterology

## 2021-11-18 ENCOUNTER — Telehealth: Payer: Self-pay | Admitting: Gastroenterology

## 2021-11-18 MED ORDER — CYANOCOBALAMIN 1000 MCG/ML IJ SOLN
1000.0000 ug | INTRAMUSCULAR | 6 refills | Status: DC
Start: 2021-11-18 — End: 2022-10-07

## 2021-11-18 NOTE — Telephone Encounter (Signed)
Inbound call from patient stating she is in need of a medication refill for Vitamin B. Please advise.

## 2021-11-18 NOTE — Telephone Encounter (Signed)
Rx for Vitamin B 12 sent to the pharmacy as requested.

## 2021-11-30 ENCOUNTER — Other Ambulatory Visit: Payer: Self-pay

## 2021-11-30 DIAGNOSIS — Z227 Latent tuberculosis: Secondary | ICD-10-CM

## 2021-11-30 DIAGNOSIS — Z79899 Other long term (current) drug therapy: Secondary | ICD-10-CM

## 2021-12-03 ENCOUNTER — Ambulatory Visit (INDEPENDENT_AMBULATORY_CARE_PROVIDER_SITE_OTHER): Payer: No Typology Code available for payment source | Admitting: Family Medicine

## 2021-12-03 ENCOUNTER — Encounter: Payer: Self-pay | Admitting: Family Medicine

## 2021-12-03 VITALS — BP 122/78 | HR 69 | Temp 97.8°F | Resp 18 | Ht 63.0 in | Wt 115.8 lb

## 2021-12-03 DIAGNOSIS — Z0000 Encounter for general adult medical examination without abnormal findings: Secondary | ICD-10-CM | POA: Diagnosis not present

## 2021-12-03 DIAGNOSIS — E559 Vitamin D deficiency, unspecified: Secondary | ICD-10-CM | POA: Diagnosis not present

## 2021-12-03 DIAGNOSIS — E781 Pure hyperglyceridemia: Secondary | ICD-10-CM

## 2021-12-03 DIAGNOSIS — Z114 Encounter for screening for human immunodeficiency virus [HIV]: Secondary | ICD-10-CM | POA: Diagnosis not present

## 2021-12-03 DIAGNOSIS — Z23 Encounter for immunization: Secondary | ICD-10-CM | POA: Diagnosis not present

## 2021-12-03 LAB — CBC WITH DIFFERENTIAL/PLATELET
Basophils Absolute: 0 10*3/uL (ref 0.0–0.1)
Basophils Relative: 0.5 % (ref 0.0–3.0)
Eosinophils Absolute: 0.1 10*3/uL (ref 0.0–0.7)
Eosinophils Relative: 2 % (ref 0.0–5.0)
HCT: 41.9 % (ref 36.0–46.0)
Hemoglobin: 13.6 g/dL (ref 12.0–15.0)
Lymphocytes Relative: 36.7 % (ref 12.0–46.0)
Lymphs Abs: 2.5 10*3/uL (ref 0.7–4.0)
MCHC: 32.5 g/dL (ref 30.0–36.0)
MCV: 86.2 fl (ref 78.0–100.0)
Monocytes Absolute: 0.7 10*3/uL (ref 0.1–1.0)
Monocytes Relative: 10.1 % (ref 3.0–12.0)
Neutro Abs: 3.4 10*3/uL (ref 1.4–7.7)
Neutrophils Relative %: 50.7 % (ref 43.0–77.0)
Platelets: 283 10*3/uL (ref 150.0–400.0)
RBC: 4.86 Mil/uL (ref 3.87–5.11)
RDW: 14.7 % (ref 11.5–15.5)
WBC: 6.8 10*3/uL (ref 4.0–10.5)

## 2021-12-03 LAB — BASIC METABOLIC PANEL
BUN: 12 mg/dL (ref 6–23)
CO2: 29 mEq/L (ref 19–32)
Calcium: 9.3 mg/dL (ref 8.4–10.5)
Chloride: 104 mEq/L (ref 96–112)
Creatinine, Ser: 0.66 mg/dL (ref 0.40–1.20)
GFR: 99.78 mL/min (ref >60.00)
Glucose, Bld: 74 mg/dL (ref 70–99)
Potassium: 4.4 mEq/L (ref 3.5–5.1)
Sodium: 139 mEq/L (ref 135–145)

## 2021-12-03 LAB — TSH: TSH: 1.49 u[IU]/mL (ref 0.35–5.50)

## 2021-12-03 LAB — HEPATIC FUNCTION PANEL
ALT: 15 U/L (ref 0–35)
AST: 18 U/L (ref 0–37)
Albumin: 3.8 g/dL (ref 3.5–5.2)
Alkaline Phosphatase: 80 U/L (ref 39–117)
Bilirubin, Direct: 0.2 mg/dL (ref 0.0–0.3)
Total Bilirubin: 1.5 mg/dL — ABNORMAL HIGH (ref 0.2–1.2)
Total Protein: 6.9 g/dL (ref 6.0–8.3)

## 2021-12-03 LAB — LIPID PANEL
Cholesterol: 158 mg/dL (ref 0–200)
HDL: 78.1 mg/dL (ref >39.00)
LDL Cholesterol: 49 mg/dL (ref 0–99)
NonHDL: 80
Total CHOL/HDL Ratio: 2
Triglycerides: 156 mg/dL — ABNORMAL HIGH (ref 0.0–149.0)
VLDL: 31.2 mg/dL (ref 0.0–40.0)

## 2021-12-03 LAB — VITAMIN D 25 HYDROXY (VIT D DEFICIENCY, FRACTURES): VITD: 28.36 ng/mL — ABNORMAL LOW (ref 30.00–100.00)

## 2021-12-03 NOTE — Progress Notes (Signed)
   Subjective:    Patient ID: Karen Knox, female    DOB: 1965-12-13, 56 y.o.   MRN: [id number]  HPI CPE- UTD on Tdap, colonoscopy.  GYN- Megan Morris.  Due for mammogram.  UTD on pap (05/23/18)  Patient Care Team    Relationship Specialty Notifications Start End  Midge Minium, MD PCP - General Family Medicine  02/14/18   Hennie Duos, MD Consulting Physician Rheumatology  02/14/18   Linda Hedges, [place] Physician Obstetrics and Gynecology  02/14/18   Mauri Pole, MD Consulting Physician Gastroenterology  02/14/18     Health Maintenance  Topic Date Due   Zoster Vaccines- Shingrix (1 of 2) Never done   COVID-19 Vaccine (3 - Pfizer risk series) 12/19/2021 (Originally 07/29/2019)   INFLUENZA VACCINE  06/20/2022 (Originally 10/20/2021)   MAMMOGRAM  12/04/2022 (Originally 05/17/2019)   COLONOSCOPY (Pts 45-28yr Insurance coverage will need to be confirmed)  04/19/2023   PAP SMEAR-Modifier  05/23/2023   TETANUS/TDAP  10/29/2026   Hepatitis C Screening  Completed   HIV Screening  Completed   HPV VACCINES  Aged Out      Review of Systems Patient reports no vision/ hearing changes, adenopathy,fever, weight change,  persistant/recurrent hoarseness , swallowing issues, chest pain, palpitations, edema, persistant/recurrent cough, hemoptysis, dyspnea (rest/exertional/paroxysmal nocturnal), gastrointestinal bleeding (melena, rectal bleeding), abdominal pain, significant heartburn, bowel changes, GU symptoms (dysuria, hematuria, incontinence), Gyn symptoms (abnormal  bleeding, pain),  syncope, focal weakness, memory loss, numbness & tingling, skin/hair/nail changes, abnormal bruising or bleeding, anxiety, or depression.     Objective:   Physical Exam General Appearance:    Alert, cooperative, no distress, appears stated age  Head:    Normocephalic, without obvious abnormality, atraumatic  Eyes:    PERRL, conjunctiva/corneas clear, EOM's intact both eyes  Ears:    Normal  TM's and external ear canals, both ears  Nose:   Nares normal, septum midline, mucosa normal, no drainage    or sinus tenderness  Throat:   Lips, mucosa, and tongue normal; teeth and gums normal  Neck:   Supple, symmetrical, trachea midline, no adenopathy;    Thyroid: no enlargement/tenderness/nodules  Back:     Symmetric, no curvature, ROM normal, no CVA tenderness  Lungs:     Clear to auscultation bilaterally, respirations unlabored  Chest Wall:    No tenderness or deformity   Heart:    Regular rate and rhythm, S1 and S2 normal, no murmur, rub   or gallop  Breast Exam:    Deferred to GYN  Abdomen:     Soft, non-tender, bowel sounds active all four quadrants,    no masses, no organomegaly  Genitalia:    Deferred to GYN  Rectal:    Extremities:   Extremities normal, atraumatic, no cyanosis or edema  Pulses:   2+ and symmetric all extremities  Skin:   Skin color, texture, turgor normal, no rashes or lesions  Lymph nodes:   Cervical, supraclavicular, and axillary nodes normal  Neurologic:   CNII-XII intact, normal strength, sensation and reflexes    throughout          Assessment & Plan:

## 2021-12-03 NOTE — Patient Instructions (Addendum)
Follow up in 1 year or as needed Schedule a nurse visit in 2-6 months for 2nd shingles shot We'll notify you of your lab results and make any changes if needed Keep up the good work on healthy diet and regular exercise- you look great!!! Have them send me a copy of your mammogram whenever you go Call with any questions or concerns Stay Safe!  Stay Healthy! Happy Fall!!!

## 2021-12-03 NOTE — Assessment & Plan Note (Signed)
Check labs and replete prn. 

## 2021-12-03 NOTE — Assessment & Plan Note (Signed)
Pt's PE WNL.  UTD on colonoscopy, pap.  Due for mammo.  UTD on Tdap.  Due for shingles shot- 1st Zostavax given today.  Check labs.  Anticipatory guidance provided.

## 2021-12-03 NOTE — Assessment & Plan Note (Signed)
Check labs and determine if medication needed

## 2021-12-14 ENCOUNTER — Ambulatory Visit (INDEPENDENT_AMBULATORY_CARE_PROVIDER_SITE_OTHER)
Admission: RE | Admit: 2021-12-14 | Discharge: 2021-12-14 | Disposition: A | Discharge status: Home / Self Care | Payer: No Typology Code available for payment source | Source: Ambulatory Visit | Attending: Gastroenterology | Admitting: Gastroenterology

## 2021-12-14 DIAGNOSIS — Z79899 Other long term (current) drug therapy: Secondary | ICD-10-CM | POA: Diagnosis not present

## 2021-12-14 DIAGNOSIS — Z227 Latent tuberculosis: Secondary | ICD-10-CM | POA: Diagnosis not present

## 2022-01-03 ENCOUNTER — Other Ambulatory Visit: Payer: Self-pay | Admitting: Family Medicine

## 2022-02-07 ENCOUNTER — Other Ambulatory Visit: Payer: Self-pay | Admitting: Family Medicine

## 2022-02-19 ENCOUNTER — Ambulatory Visit: Payer: No Typology Code available for payment source

## 2022-02-19 ENCOUNTER — Ambulatory Visit (INDEPENDENT_AMBULATORY_CARE_PROVIDER_SITE_OTHER): Payer: No Typology Code available for payment source | Admitting: Family Medicine

## 2022-02-19 DIAGNOSIS — Z23 Encounter for immunization: Secondary | ICD-10-CM

## 2022-02-19 NOTE — Progress Notes (Signed)
Patient presented today for 2nd shingles vaccine, administered without issue, no questions today

## 2022-03-11 ENCOUNTER — Other Ambulatory Visit: Payer: Self-pay | Admitting: Nurse Practitioner

## 2022-03-11 DIAGNOSIS — N63 Unspecified lump in unspecified breast: Secondary | ICD-10-CM

## 2022-05-09 ENCOUNTER — Other Ambulatory Visit: Payer: Self-pay | Admitting: Family Medicine

## 2022-05-17 ENCOUNTER — Encounter: Payer: Self-pay | Admitting: Obstetrics & Gynecology

## 2022-05-17 ENCOUNTER — Other Ambulatory Visit: Payer: Self-pay | Admitting: Obstetrics & Gynecology

## 2022-05-17 DIAGNOSIS — N63 Unspecified lump in unspecified breast: Secondary | ICD-10-CM

## 2022-05-18 ENCOUNTER — Encounter: Payer: Self-pay | Admitting: Obstetrics & Gynecology

## 2022-05-18 ENCOUNTER — Ambulatory Visit
Admission: RE | Admit: 2022-05-18 | Discharge: 2022-05-18 | Disposition: A | Discharge status: Home / Self Care | Payer: No Typology Code available for payment source | Source: Ambulatory Visit | Attending: Nurse Practitioner | Admitting: Nurse Practitioner

## 2022-05-18 ENCOUNTER — Other Ambulatory Visit: Payer: No Typology Code available for payment source

## 2022-05-18 DIAGNOSIS — R921 Mammographic calcification found on diagnostic imaging of breast: Secondary | ICD-10-CM

## 2022-05-18 DIAGNOSIS — N63 Unspecified lump in unspecified breast: Secondary | ICD-10-CM

## 2022-05-18 LAB — HM MAMMOGRAPHY

## 2022-05-19 ENCOUNTER — Other Ambulatory Visit: Payer: Self-pay | Admitting: Obstetrics & Gynecology

## 2022-05-19 DIAGNOSIS — R921 Mammographic calcification found on diagnostic imaging of breast: Secondary | ICD-10-CM

## 2022-06-04 ENCOUNTER — Ambulatory Visit
Admission: RE | Admit: 2022-06-04 | Discharge: 2022-06-04 | Disposition: A | Discharge status: Home / Self Care | Payer: No Typology Code available for payment source | Source: Ambulatory Visit | Attending: Obstetrics & Gynecology | Admitting: Obstetrics & Gynecology

## 2022-06-04 DIAGNOSIS — R921 Mammographic calcification found on diagnostic imaging of breast: Secondary | ICD-10-CM

## 2022-06-04 HISTORY — PX: BREAST BIOPSY: SHX20

## 2022-06-08 ENCOUNTER — Other Ambulatory Visit: Payer: Self-pay | Admitting: Family Medicine

## 2022-07-03 IMAGING — CT CT ENTEROGRAPHY (ABD-PELV W/ CM)
2 of 6 series · 16 of 46 positions shown, 18 images · IV contrast (OMNIPAQUE)
Comparison: None.

CLINICAL DATA: Right lower quadrant pain. Diarrhea. Crohn disease.
Previous bowel resections.

EXAM:
CT ABDOMEN AND PELVIS WITH CONTRAST (ENTEROGRAPHY)
TECHNIQUE: Multidetector CT of the abdomen and pelvis during bolus
administration of intravenous contrast. Negative oral contrast was
given.
CONTRAST:  100mL OMNIPAQUE IOHEXOL 300 MG/ML  SOLN

[Series 4: thins · axial · 0.65mm/px · z∈[-482,-61]mm · 13 of 465 slices shown, 15 images]
[im 22/465  soft-tissue]
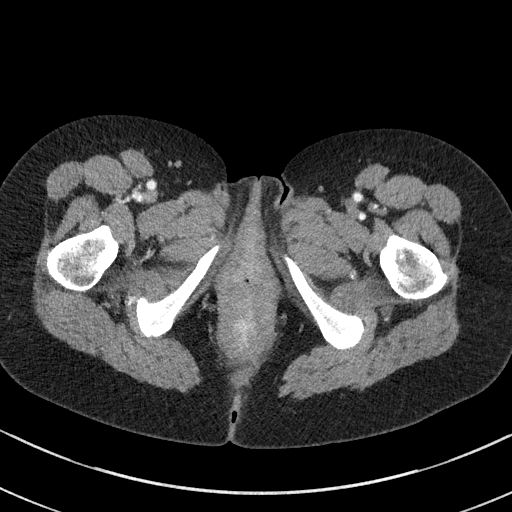
[im 22/465  bone]
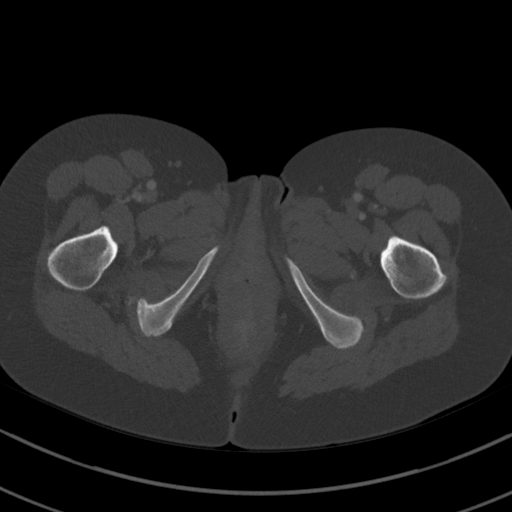
[im 64/465  soft-tissue]
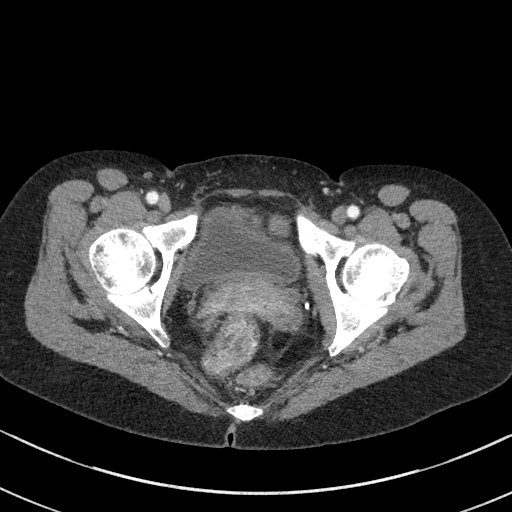
[im 106/465  soft-tissue]
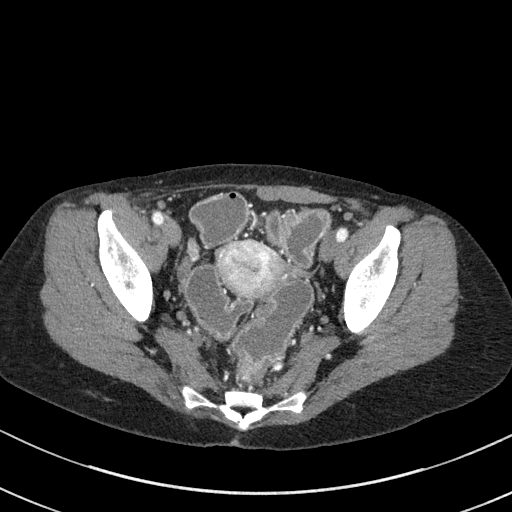
[im 127/465  soft-tissue]
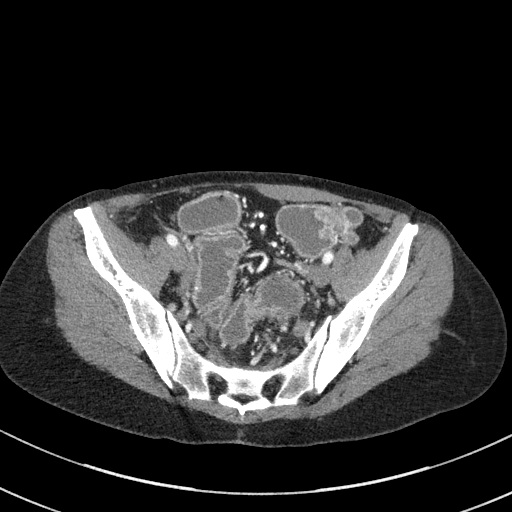
[im 169/465  soft-tissue]
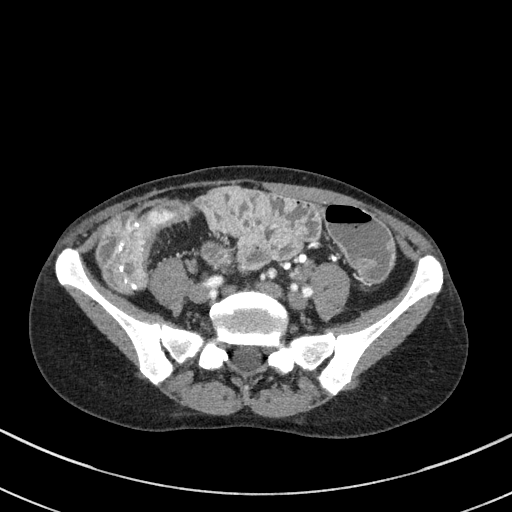
[im 190/465  soft-tissue]
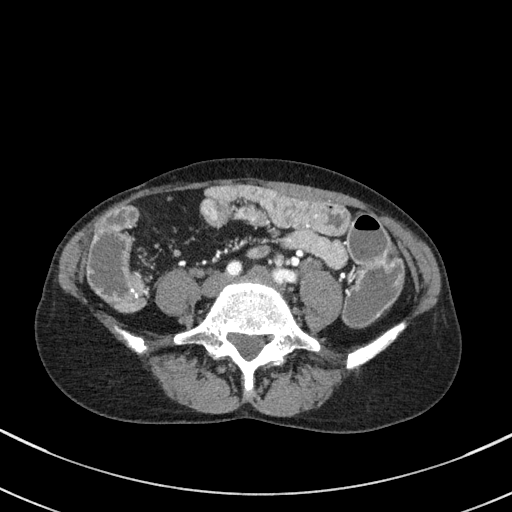
[im 233/465  soft-tissue]
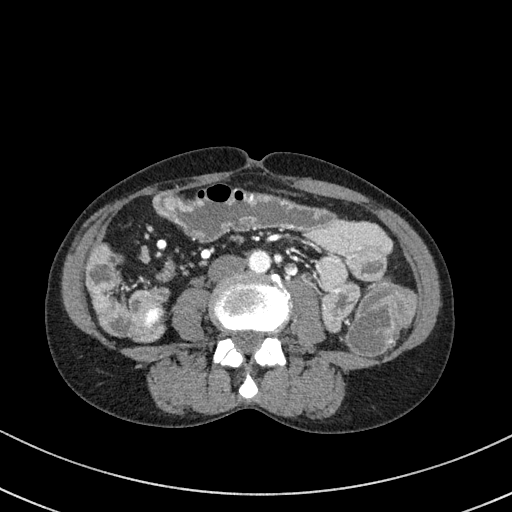
[im 275/465  soft-tissue]
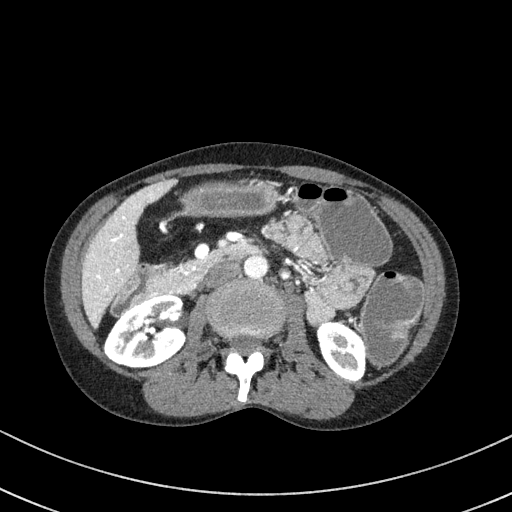
[im 296/465  soft-tissue]
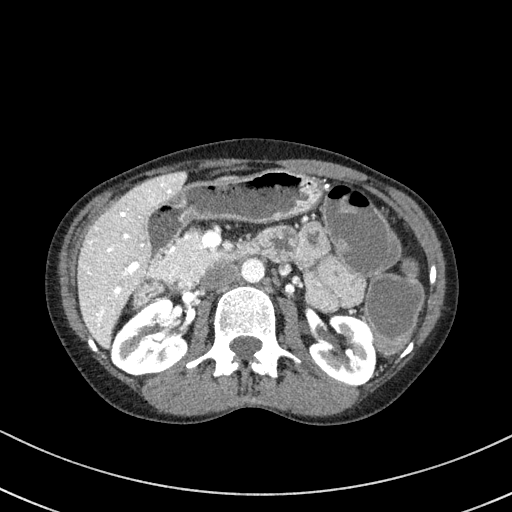
[im 296/465  bone]
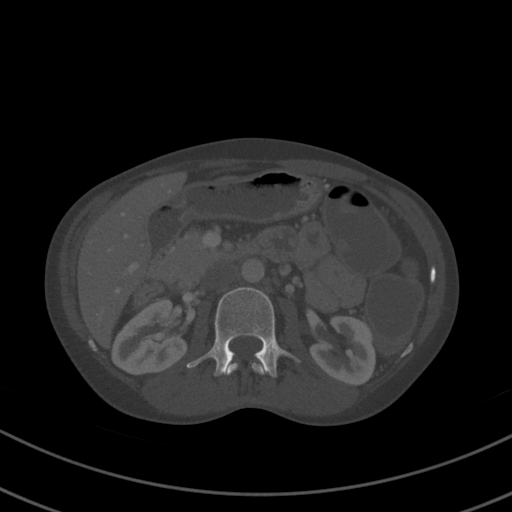
[im 338/465  soft-tissue]
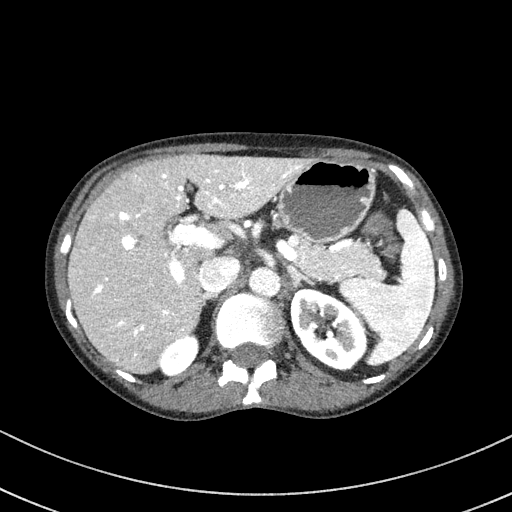
[im 359/465  soft-tissue]
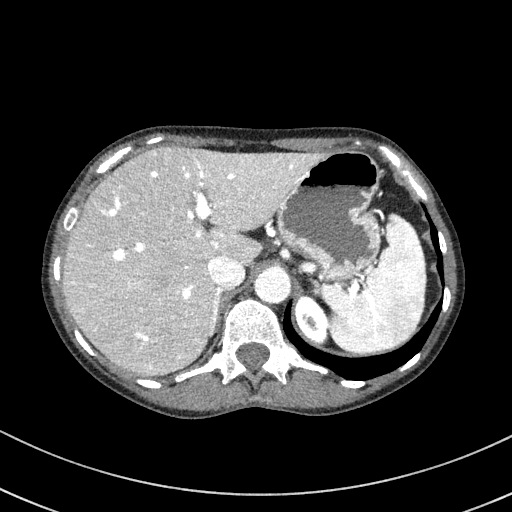
[im 401/465  soft-tissue]
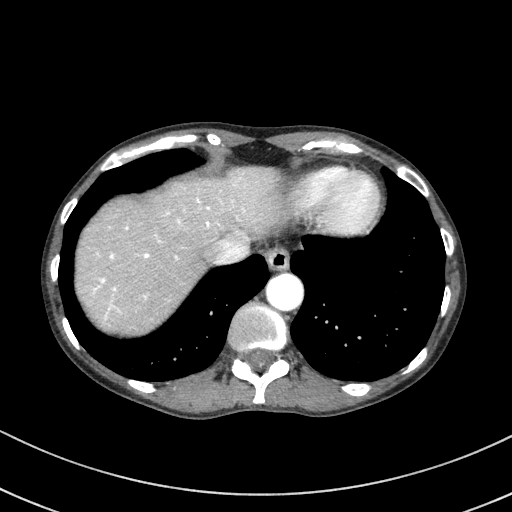
[im 443/465  soft-tissue]
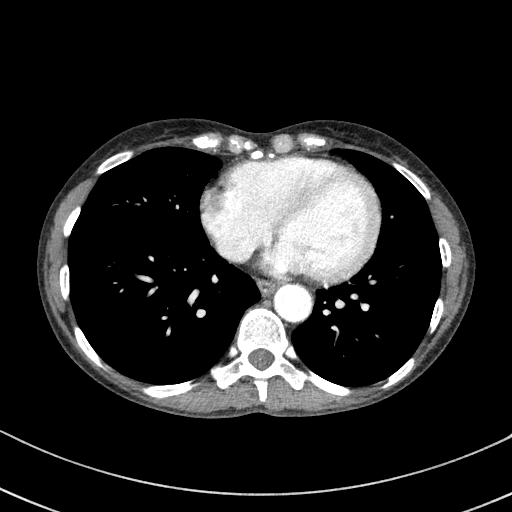

[Series 6: coronal · coronal · 0.83mm/px · 3 of 69 slices shown]
[im 23/69  soft-tissue]
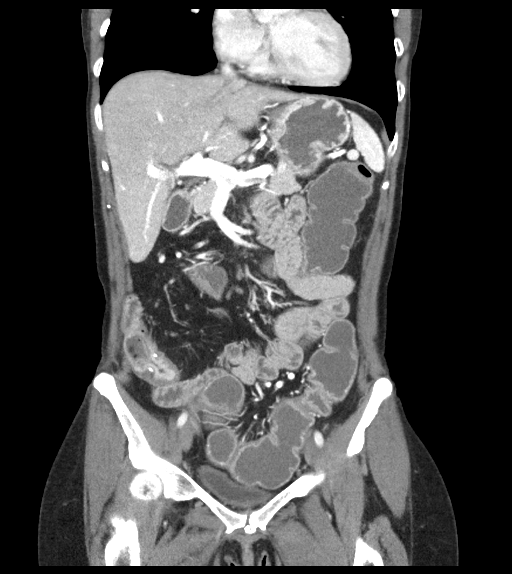
[im 31/69  soft-tissue]
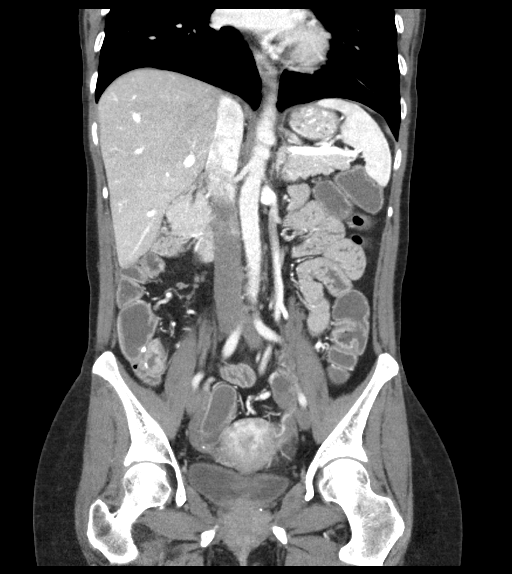
[im 38/69  soft-tissue]
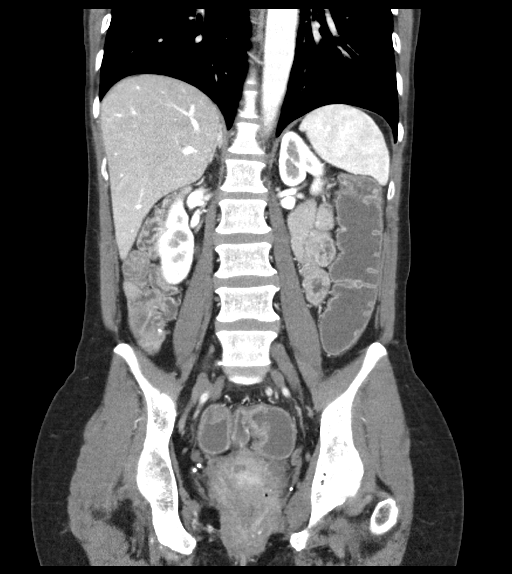

[16 of 46 positions shown; findings below may reference images not displayed]

FINDINGS: Lower Chest: No acute findings.

Hepatobiliary: A 12 mm homogeneous hypervascular mass is seen in
segment 2 (image 79/4), which has nonspecific characteristics. No
other liver lesions are identified. Prior cholecystectomy. No
evidence of biliary obstruction.

Pancreas:  No mass or inflammatory changes.

Spleen: Within normal limits in size and appearance.

Adrenals/Urinary Tract: No masses identified. No evidence of
ureteral calculi or hydronephrosis.

Stomach/Bowel: Postop changes are seen from previous right colectomy
and distal small bowel resection. A stricture is seen involving a
segment of distal ileum just proximal to the ileocolic anastomosis
which measures approximately 8 cm in length. This segment also shows
mucosal hyperenhancement and mild wall thickening, but no adjacent
mesenteric inflammatory changes. No evidence of fistula or abscess.
No other sites of bowel involvement identified.

Vascular/Lymphatic: Mild mesenteric lymphadenopathy is seen in the
right abdomen, with largest lymph node measuring 11 mm on image
250/4. These are likely reactive in etiology. No other sites of
lymphadenopathy identified. No abdominal aortic aneurysm.

Reproductive:  No mass or other significant abnormality.

Other:  None.

Musculoskeletal:  No suspicious bone lesions identified.
IMPRESSION: Stricture involving an 8 cm segment of distal ileum just proximal to
the ileocolic anastomosis, with mild wall thickening and mucosal
hyperenhancement consistent with active Crohn disease.

No evidence of fistula, abscess, or other complication.

Mild right abdominal mesenteric lymphadenopathy, likely reactive in
etiology.

12 mm indeterminate hypervascular mass in left hepatic lobe. Abdomen
MRI without and with contrast is recommended for further
characterization.

## 2022-08-20 ENCOUNTER — Telehealth: Payer: Self-pay | Admitting: Gastroenterology

## 2022-08-20 DIAGNOSIS — K50812 Crohn's disease of both small and large intestine with intestinal obstruction: Secondary | ICD-10-CM

## 2022-08-20 NOTE — Telephone Encounter (Signed)
Inbound call from patient requesting a call back regarding remicade treatment. States her insurance has denied it due to not having enough information. Please advise, thank you.

## 2022-08-20 NOTE — Telephone Encounter (Signed)
Patient received a letter from her insurance that the Remicade will not be approved if she has not tried and failed the bio similars. Patient receives her infusions at [facility] which is closed this afternoon.  Advised the patient I will call that office on Monday when they re-open and speak with the prior authorization team member.

## 2022-08-23 NOTE — Telephone Encounter (Signed)
The insurance is asking for a peer to peer again. Spoke with Lauri. She has faxed the denial information to [place] at [phone].

## 2022-08-23 NOTE — Telephone Encounter (Signed)
Lauri from [facility] calling wishing to speak with [facility] regarding this patient [phone] ext [phone]. Please advise

## 2022-08-26 NOTE — Telephone Encounter (Signed)
Inbound call from patient requesting a call back. Please advise, thank you.

## 2022-08-27 MED ORDER — PREDNISONE 10 MG PO TABS: 10.0000 mg | ORAL_TABLET | Freq: Every day | ORAL | 0 refills | Status: DC

## 2022-08-27 MED ORDER — BUDESONIDE 3 MG PO CPEP: 3.0000 mg | ORAL_CAPSULE | Freq: Every day | ORAL | 1 refills | Status: DC

## 2022-08-27 MED ORDER — PREDNISONE 5 MG PO TABS: 5.0000 mg | ORAL_TABLET | Freq: Every day | ORAL | 0 refills | Status: DC

## 2022-08-27 NOTE — Telephone Encounter (Signed)
Inbound call from patient requesting a call back. States has been without  Remicade medication. Requesting a call back to discuss if there is an alternative steroid that could be prescribed. States she is going out of town next week and would like to have something on hand so she does not get sick. Please advise, thank you.

## 2022-08-27 NOTE — Telephone Encounter (Signed)
Pt states she has been without her Remicade going on 7.5 weeks, she usually gets it every 6 weeks. She will be leaving to go out of town Monday am at 7am and is afraid she may have problems. Pt would like to have a steroid prescription sent to her pharmacy incase she has issues. Dr. Lavon Paganini please advise.

## 2022-08-27 NOTE — Telephone Encounter (Signed)
Lauri calling from Rheumatology to follow up on Peer to Peer.[phone] Ext [phone].

## 2022-08-30 NOTE — Telephone Encounter (Signed)
It appears Dr Lavon Paganini sent prescriptions for prednisone 5 mg and 10 mg tablets on 08/27/22.

## 2022-08-31 ENCOUNTER — Other Ambulatory Visit (HOSPITAL_COMMUNITY): Payer: Self-pay | Admitting: Pharmacy Technician

## 2022-08-31 ENCOUNTER — Other Ambulatory Visit: Payer: Self-pay

## 2022-08-31 NOTE — Telephone Encounter (Signed)
I called in prescription for prednisone, patient is going to be out of town for a conference and is worried if she would end up having an acute flare.  She is almost 8 weeks since her last infusion, insurance denying infliximab and was made to switch to bio similar.  She has been on infliximab for over 20 years and is worried that she will not have the same response with biosimilar and is appealing for infliximab. Beth, please check if she is approved for infliximab?  Will it help to switch her to the infusion center so they can work on the approval process and may be able to get an answer back sooner

## 2022-08-31 NOTE — Telephone Encounter (Signed)
Called Jacki Cones at [facility] and left a message. Need the case number and the information to set up a peer to peer.

## 2022-08-31 NOTE — Telephone Encounter (Signed)
Spoke with the patient. She is agreeable to changing her infusion centers to [facility] [address] Orders for Remicade 5mg /kg every 6 weeks placed.

## 2022-08-31 NOTE — Telephone Encounter (Signed)
[facility] is the prescriber, so not sure what they did with peer to peer

## 2022-09-02 NOTE — Telephone Encounter (Signed)
Patient is calling wishing to speak with Karen Knox states she spoke with her insurance this morning and has more information about getting her case approved. Please advise

## 2022-09-03 NOTE — Telephone Encounter (Signed)
Patient has been in touch with her insurance. She spoke with Lelon Mast who is telling her that the denial is still there and peer to peer is pending. She is not aware of who this denial was sent to.

## 2022-09-03 NOTE — Telephone Encounter (Signed)
Patient is calling back to follow up on Remicade medication.

## 2022-09-06 ENCOUNTER — Telehealth: Payer: Self-pay | Admitting: Pharmacy Technician

## 2022-09-06 ENCOUNTER — Other Ambulatory Visit: Payer: Self-pay

## 2022-09-06 DIAGNOSIS — K50812 Crohn's disease of both small and large intestine with intestinal obstruction: Secondary | ICD-10-CM

## 2022-09-06 MED ORDER — INFLIXIMAB 100 MG IV SOLR: INTRAVENOUS | 6 refills | Status: AC

## 2022-09-07 NOTE — Telephone Encounter (Signed)
Inbound call from patient , she want an update regarding Remicade  she said she is running  out of time.Please advise

## 2022-09-08 NOTE — Telephone Encounter (Signed)
Patient advised of the following: An appeal letter is being faxed to her insurance company. Patient will remain with [facility]. Lauri at [facility] notified. Patient remains asymptomatic on Entocort.

## 2022-09-08 NOTE — Telephone Encounter (Signed)
Patient is calling back , she is requesting a call back , she wants an update.

## 2022-09-17 ENCOUNTER — Telehealth: Payer: Self-pay

## 2022-09-17 NOTE — Telephone Encounter (Signed)
Opened in error

## 2022-10-04 ENCOUNTER — Emergency Department (HOSPITAL_COMMUNITY): Payer: No Typology Code available for payment source

## 2022-10-04 ENCOUNTER — Encounter (HOSPITAL_COMMUNITY): Payer: Self-pay | Admitting: Internal Medicine

## 2022-10-04 ENCOUNTER — Other Ambulatory Visit: Payer: Self-pay

## 2022-10-04 ENCOUNTER — Inpatient Hospital Stay (HOSPITAL_COMMUNITY)
Admission: EM | Admit: 2022-10-04 | Discharge: 2022-10-07 | DRG: 386 | Disposition: A | Discharge status: Home / Self Care | Payer: No Typology Code available for payment source | Attending: Family Medicine | Admitting: Family Medicine

## 2022-10-04 DIAGNOSIS — E785 Hyperlipidemia, unspecified: Secondary | ICD-10-CM | POA: Diagnosis present

## 2022-10-04 DIAGNOSIS — Z801 Family history of malignant neoplasm of trachea, bronchus and lung: Secondary | ICD-10-CM

## 2022-10-04 DIAGNOSIS — Z8261 Family history of arthritis: Secondary | ICD-10-CM

## 2022-10-04 DIAGNOSIS — Z820 Family history of epilepsy and other diseases of the nervous system: Secondary | ICD-10-CM

## 2022-10-04 DIAGNOSIS — K5010 Crohn's disease of large intestine without complications: Secondary | ICD-10-CM | POA: Diagnosis present

## 2022-10-04 DIAGNOSIS — Z9049 Acquired absence of other specified parts of digestive tract: Secondary | ICD-10-CM

## 2022-10-04 DIAGNOSIS — K56609 Unspecified intestinal obstruction, unspecified as to partial versus complete obstruction: Secondary | ICD-10-CM

## 2022-10-04 DIAGNOSIS — Z79620 Long term (current) use of immunosuppressive biologic: Secondary | ICD-10-CM

## 2022-10-04 DIAGNOSIS — Z8615 Personal history of latent tuberculosis infection: Secondary | ICD-10-CM

## 2022-10-04 DIAGNOSIS — K50912 Crohn's disease, unspecified, with intestinal obstruction: Secondary | ICD-10-CM | POA: Diagnosis present

## 2022-10-04 DIAGNOSIS — E611 Iron deficiency: Secondary | ICD-10-CM | POA: Diagnosis present

## 2022-10-04 DIAGNOSIS — Z808 Family history of malignant neoplasm of other organs or systems: Secondary | ICD-10-CM

## 2022-10-04 DIAGNOSIS — Z800 Family history of malignant neoplasm of digestive organs: Secondary | ICD-10-CM

## 2022-10-04 DIAGNOSIS — Z79899 Other long term (current) drug therapy: Secondary | ICD-10-CM

## 2022-10-04 DIAGNOSIS — Z803 Family history of malignant neoplasm of breast: Secondary | ICD-10-CM

## 2022-10-04 DIAGNOSIS — K50812 Crohn's disease of both small and large intestine with intestinal obstruction: Secondary | ICD-10-CM | POA: Diagnosis not present

## 2022-10-04 DIAGNOSIS — E861 Hypovolemia: Secondary | ICD-10-CM | POA: Diagnosis present

## 2022-10-04 DIAGNOSIS — N179 Acute kidney failure, unspecified: Secondary | ICD-10-CM | POA: Diagnosis present

## 2022-10-04 LAB — URINALYSIS, ROUTINE W REFLEX MICROSCOPIC
Bilirubin Urine: NEGATIVE
Glucose, UA: NEGATIVE mg/dL
Hgb urine dipstick: NEGATIVE
Ketones, ur: 5 mg/dL — AB
Leukocytes,Ua: NEGATIVE
Nitrite: NEGATIVE
Protein, ur: NEGATIVE mg/dL
Specific Gravity, Urine: 1.024 (ref 1.005–1.030)
pH: 6 (ref 5.0–8.0)

## 2022-10-04 LAB — COMPREHENSIVE METABOLIC PANEL
ALT: 19 U/L (ref 0–44)
AST: 28 U/L (ref 15–41)
Albumin: 3.6 g/dL (ref 3.5–5.0)
Alkaline Phosphatase: 92 U/L (ref 38–126)
Anion gap: 10 (ref 5–15)
BUN: 13 mg/dL (ref 6–20)
CO2: 26 mmol/L (ref 22–32)
Calcium: 9.1 mg/dL (ref 8.9–10.3)
Chloride: 101 mmol/L (ref 98–111)
Creatinine, Ser: 0.99 mg/dL (ref 0.44–1.00)
GFR, Estimated: >60 mL/min (ref >60)
Glucose, Bld: 105 mg/dL — ABNORMAL HIGH (ref 70–99)
Potassium: 4.2 mmol/L (ref 3.5–5.1)
Sodium: 137 mmol/L (ref 135–145)
Total Bilirubin: 1.8 mg/dL — ABNORMAL HIGH (ref 0.3–1.2)
Total Protein: 7.1 g/dL (ref 6.5–8.1)

## 2022-10-04 LAB — CBC WITH DIFFERENTIAL/PLATELET
Abs Immature Granulocytes: 0.08 10*3/uL — ABNORMAL HIGH (ref 0.00–0.07)
Basophils Absolute: 0 10*3/uL (ref 0.0–0.1)
Basophils Relative: 0 %
Eosinophils Absolute: 0 10*3/uL (ref 0.0–0.5)
Eosinophils Relative: 0 %
HCT: 46.1 % — ABNORMAL HIGH (ref 36.0–46.0)
Hemoglobin: 14.6 g/dL (ref 12.0–15.0)
Immature Granulocytes: 0 %
Lymphocytes Relative: 10 %
Lymphs Abs: 2 10*3/uL (ref 0.7–4.0)
MCH: 27.1 pg (ref 26.0–34.0)
MCHC: 31.7 g/dL (ref 30.0–36.0)
MCV: 85.5 fL (ref 80.0–100.0)
Monocytes Absolute: 1.7 10*3/uL — ABNORMAL HIGH (ref 0.1–1.0)
Monocytes Relative: 9 %
Neutro Abs: 16.2 10*3/uL — ABNORMAL HIGH (ref 1.7–7.7)
Neutrophils Relative %: 81 %
Platelets: 400 10*3/uL (ref 150–400)
RBC: 5.39 MIL/uL — ABNORMAL HIGH (ref 3.87–5.11)
RDW: 13.5 % (ref 11.5–15.5)
WBC: 20 10*3/uL — ABNORMAL HIGH (ref 4.0–10.5)
nRBC: 0 % (ref 0.0–0.2)

## 2022-10-04 LAB — LIPASE, BLOOD: Lipase: 47 U/L (ref 11–51)

## 2022-10-04 LAB — HCG, SERUM, QUALITATIVE: Preg, Serum: NEGATIVE

## 2022-10-04 LAB — CBG MONITORING, ED: Glucose-Capillary: 110 mg/dL — ABNORMAL HIGH (ref 70–99)

## 2022-10-04 LAB — MAGNESIUM: Magnesium: 2.3 mg/dL (ref 1.7–2.4)

## 2022-10-04 MED ORDER — HYDROMORPHONE HCL 1 MG/ML IJ SOLN
0.5000 mg | INTRAMUSCULAR | Status: DC | PRN
  Administered 2022-10-04: 0.5 mg via INTRAVENOUS
  Administered 2022-10-05 (×3): 1 mg via INTRAVENOUS
  Filled 2022-10-04 (×4): qty 1

## 2022-10-04 MED ORDER — HYDRALAZINE HCL 20 MG/ML IJ SOLN: 5.0000 mg | Freq: Four times a day (QID) | INTRAMUSCULAR | Status: DC | PRN

## 2022-10-04 MED ORDER — SODIUM CHLORIDE 0.9 % IV BOLUS
1000.0000 mL | Freq: Once | INTRAVENOUS | Status: AC
  Administered 2022-10-04: 1000 mL via INTRAVENOUS

## 2022-10-04 MED ORDER — ONDANSETRON HCL 4 MG PO TABS: 4.0000 mg | ORAL_TABLET | Freq: Four times a day (QID) | ORAL | Status: DC | PRN

## 2022-10-04 MED ORDER — ORAL CARE MOUTH RINSE: 15.0000 mL | OROMUCOSAL | Status: DC | PRN

## 2022-10-04 MED ORDER — CLOBETASOL PROPIONATE 0.05 % EX CREA
TOPICAL_CREAM | Freq: Two times a day (BID) | CUTANEOUS | Status: DC
  Filled 2022-10-04: qty 15

## 2022-10-04 MED ORDER — IOHEXOL 350 MG/ML SOLN
75.0000 mL | Freq: Once | INTRAVENOUS | Status: AC | PRN
  Administered 2022-10-04: 75 mL via INTRAVENOUS

## 2022-10-04 MED ORDER — OFLOXACIN 0.3 % OP SOLN: 5.0000 [drp] | Freq: Every day | OPHTHALMIC | Status: DC

## 2022-10-04 MED ORDER — CHOLESTYRAMINE LIGHT 4 G PO PACK: 4.0000 g | PACK | Freq: Every day | ORAL | Status: DC | PRN

## 2022-10-04 MED ORDER — METHYLPREDNISOLONE SODIUM SUCC 40 MG IJ SOLR
40.0000 mg | Freq: Every day | INTRAMUSCULAR | Status: DC
  Administered 2022-10-04: 40 mg via INTRAVENOUS
  Filled 2022-10-04: qty 1

## 2022-10-04 MED ORDER — BUDESONIDE 3 MG PO CPEP
3.0000 mg | ORAL_CAPSULE | Freq: Every day | ORAL | Status: DC
  Filled 2022-10-04: qty 1

## 2022-10-04 MED ORDER — ENOXAPARIN SODIUM 40 MG/0.4ML IJ SOSY
40.0000 mg | PREFILLED_SYRINGE | INTRAMUSCULAR | Status: DC
  Administered 2022-10-04 – 2022-10-06 (×3): 40 mg via SUBCUTANEOUS
  Filled 2022-10-04 (×3): qty 0.4

## 2022-10-04 MED ORDER — LIDOCAINE HCL URETHRAL/MUCOSAL 2 % EX GEL
1.0000 | Freq: Once | CUTANEOUS | Status: DC
  Filled 2022-10-04: qty 11

## 2022-10-04 MED ORDER — SODIUM CHLORIDE 0.9 % IV SOLN: INTRAVENOUS | Status: DC

## 2022-10-04 MED ORDER — ONDANSETRON HCL 4 MG/2ML IJ SOLN
4.0000 mg | Freq: Four times a day (QID) | INTRAMUSCULAR | Status: DC | PRN
  Administered 2022-10-04 – 2022-10-05 (×3): 4 mg via INTRAVENOUS
  Filled 2022-10-04 (×3): qty 2

## 2022-10-04 NOTE — ED Provider Notes (Signed)
Patient care assumed from previous provider.   Patient care of Karen Knox is a 57 y.o. female from previous provider. Please see the original provider note from this emergency department encounter for full history and physical.  Briefly patient with a history of Crohn's disease who has been unable to get her Remicade for insurance reasons.  And here for nausea and vomiting.  It has been diagnosed with a small bowel obstruction.  Has had multiple surgeries in the past.  General surgery consulted and agreed to evaluate the patient.  Requested to the hospital admission with consult by GI for recommendation for steroid.  Spoke with hospitalist who graciously agreed to admit patient to their service.  I have consulted to the [facility] who she follows with and awaiting callback.  Course of Care and my assessment at the time of sign out is detailed in the ED Course below.   Spoke with general surgery who agreed to evaluate the patient and request that she be admitted to medicine with a GI consult.  No indication for acute surgery at this time.  Spoke with hospitalist who graciously agreed to admit the patient.  GI consult was placed.   Labs reviewed by myself and considered in medical decision making.  Imaging reviewed by myself and considered in medical decision making. Imaging final read interpreted by radiology.  1. Small bowel obstruction (HCC)      The plan for this patient was discussed with Dr. Eloise Harman, Enzo Montgomery, MD , who voiced agreement and who oversaw evaluation and treatment of this patient.     Fayrene Helper, MD 10/05/22 1107    Rondel Baton, MD 10/06/22 1322

## 2022-10-04 NOTE — ED Triage Notes (Signed)
Pt with hx of Crohn's disease here for eval of abdominal pain x 2 days. No BM in a couple of days but vomiting today. Also has cramping in legs. Recent changes in medications and delay in getting her remicade. Has felt somewhat better after vomiting this morning but states unable to tolerate PO intake.

## 2022-10-04 NOTE — H&P (Signed)
History and Physical    Karen Knox YSA:[id number] DOB: 17-May-1965 DOA: 10/04/2022  PCP: Sheliah Hatch, MD (Confirm with patient/family/NH records and if not entered, this has to be entered at [facility] point of entry) Patient coming from: Home  I have personally briefly reviewed patient's old medical records in [facility]  Chief Complaint: Belly hurts  HPI: Karen Knox is a 58 y.o. female with medical history significant of Crohn's disease, repeated small bowel obstruction status post partial colectomy/small bowel resection in 1993 and 2002, presented with sudden onset of abdominal pain and constipation.  Symptoms started 2 days ago, patient bowel movement pattern suddenly changed, no bowel movement within last 48 hours as compared to baseline she usually moves her bowels 3-4 times a day given history of shortened bowel after 2 colon/small bowel resection before.  She describes the pain as periumbilical, cramping-like, became constant overnight.  Started to feel nausea this morning and vomited x 1.  No fever or chills.  Lately, her treatment for Crohn's disease has changed.  She used to be on single medication of maintenance Remicade every 6 weeks until April-May, due to insurance switch, she could not get Remicade for 2 cycles of total 12 weeks, then she started to have flareup of Crohn's disease with severe abdominal pain in late June.  She was started on Entocort and prednisone 50 mg daily, and on July 3 she finally resumed Remicade infusion.  However, she decided to taper prednisone herself after restarting Remicade, unfortunately she did not tapering in 1 week from 50 mg to 10 mg last week, she does confirm that she still takes Entocort.  ED Course: Afebrile, blood pressure stable.  T abdomen pel with with contrast showed high-grade obstruction in the distal small bowel approximate area between the ilea-ilea anastomosis and ilio-colic anastomosis site compatible of acute  Crohn's diseas and SBO. Creatinine 0.9, bicarb 20 6K4.2.  Patient was given multiple rounds of IV Dilaudid, Zofran and IV fluids  Review of Systems: As per HPI otherwise 14 point review of systems negative.    Past Medical History:  Diagnosis Date   Anal fissure    Arthritis    ? IBD related?   B12 deficiency    Crohn's disease (HCC)    External hemorrhoids    Gilbert's syndrome    Internal hemorrhoids    Iron deficiency anemia    Small bowel stricture (HCC)    multiple 07/05/00   TB (tuberculosis)    "latent"- was treated because pt was on Remicade; October 2016    Past Surgical History:  Procedure Laterality Date   APPENDECTOMY     with initial colon resection including terminal ileum   BREAST BIOPSY Right 06/04/2022   MM RT BREAST BX W LOC DEV 1ST LESION IMAGE BX SPEC STEREO GUIDE 06/04/2022 [facility]   CHOLECYSTECTOMY     COLON RESECTION  1993, 2002   x2   COLONOSCOPY     EXPLORATORY LAPAROTOMY  2002   with extensive lysis of adhesions, resection of ileocolonic anastomosis,resection segment small bowel; anastomosis of small bowel  to small bowel; ileocolic anastomosis   UPPER GASTROINTESTINAL ENDOSCOPY       reports that she has never smoked. She has never used smokeless tobacco. She reports current alcohol use. She reports that she does not use drugs.  No Known Allergies  Family History  Problem Relation Age of Onset   Melanoma Mother    Breast cancer Mother 66   Arthritis  Father        and on mothers side   Multiple sclerosis Father    Colon cancer Father 68   Lung cancer Father        smoker   Esophageal cancer Neg Hx    Rectal cancer Neg Hx    Stomach cancer Neg Hx      Prior to Admission medications   Medication Sig Start Date End Date Taking? Authorizing Provider  budesonide (ENTOCORT EC) 3 MG 24 hr capsule Take 1 capsule (3 mg total) by mouth daily. 08/27/22  Yes Nandigam, Eleonore Chiquito, MD  Cholecalciferol (VITAMIN D) 125 MCG (5000 UT) CAPS  Take 5,000 Units by mouth daily.   Yes [provider]  cyanocobalamin (VITAMIN B12) 1000 MCG/ML injection INJECT 1 ML (1,000 MCG TOTAL) INTO THE MUSCLE EVERY 30 DAYS. Patient taking differently: Inject 1,000 mcg into the muscle every 30 (thirty) days. 11/29/21  Yes Nandigam, Eleonore Chiquito, MD  inFLIXimab (REMICADE) 100 MG injection 10mg /kg every 6 weeks Patient taking differently: Inject 100 mg into the muscle See admin instructions. 10mg /kg every 6 weeks 09/06/22  Yes Nandigam, Eleonore Chiquito, MD  MAGNESIUM MALATE PO Take 4,050 mg by mouth daily. 1350 mg each capsule   Yes [provider]  potassium citrate (UROCIT-K) 5 MEQ (540 MG) SR tablet Take 5 mEq by mouth daily.   Yes [provider]  cholestyramine light (PREVALITE) 4 GM/DOSE powder Take 1 packet (4 g total) by mouth daily. Patient not taking: Reported on 10/04/2022 01/23/21   Napoleon Form, MD  cyanocobalamin (VITAMIN B12) 1000 MCG/ML injection Inject 1 mL (1,000 mcg total) into the muscle every 30 (thirty) days. Patient not taking: Reported on 10/04/2022 11/18/21   Napoleon Form, MD  ofloxacin (FLOXIN OTIC) 0.3 % OTIC solution Place 5 drops into the right ear daily. Patient not taking: Reported on 10/04/2022 11/11/21   Jarold Motto, PA  predniSONE (DELTASONE) 10 MG tablet Take 1 tablet (10 mg total) by mouth daily with breakfast. Patient not taking: Reported on 10/04/2022 08/27/22   Napoleon Form, MD  predniSONE (DELTASONE) 5 MG tablet Take 1 tablet (5 mg total) by mouth daily with breakfast. Patient not taking: Reported on 10/04/2022 08/27/22   Napoleon Form, MD  Vitamin D, Ergocalciferol, (DRISDOL) 1.25 MG (50000 UNIT) CAPS capsule TAKE 1 CAPSULE (50,000 UNITS TOTAL) BY MOUTH EVERY 7 (SEVEN) DAYS Patient not taking: Reported on 10/04/2022 02/08/22   Sheliah Hatch, MD    Physical Exam: Vitals:   10/04/22 1013 10/04/22 1345  BP: (!) 150/98 (!) 146/87  Pulse: 60 62  Resp: 16 16  Temp: 97.6 F  (36.4 C) 98.3 F (36.8 C)  TempSrc: Oral Oral  SpO2: 100% 97%    Constitutional: NAD, calm, comfortable Vitals:   10/04/22 1013 10/04/22 1345  BP: (!) 150/98 (!) 146/87  Pulse: 60 62  Resp: 16 16  Temp: 97.6 F (36.4 C) 98.3 F (36.8 C)  TempSrc: Oral Oral  SpO2: 100% 97%   Eyes: PERRL, lids and conjunctivae normal ENMT: Mucous membranes are moist. Posterior pharynx clear of any exudate or lesions.Normal dentition.  Neck: normal, supple, no masses, no thyromegaly Respiratory: clear to auscultation bilaterally, no wheezing, no crackles. Normal respiratory effort. No accessory muscle use.  Cardiovascular: Regular rate and rhythm, no murmurs / rubs / gallops. No extremity edema. 2+ pedal pulses. No carotid bruits.  Abdomen: no tenderness, no masses palpated. No hepatosplenomegaly. Bowel sounds positive.  Musculoskeletal: no clubbing / cyanosis.  No joint deformity upper and lower extremities. Good ROM, no contractures. Normal muscle tone.  Skin: no rashes, lesions, ulcers. No induration Neurologic: CN 2-12 grossly intact. Sensation intact, DTR normal. Strength 5/5 in all 4.  Psychiatric: Normal judgment and insight. Alert and oriented x 3. Normal mood.     Labs on Admission: I have personally reviewed following labs and imaging studies  CBC: Recent Labs  Lab 10/04/22 1019  WBC 20.0*  NEUTROABS 16.2*  HGB 14.6  HCT 46.1*  MCV 85.5  PLT 400   Basic Metabolic Panel: Recent Labs  Lab 10/04/22 1019  NA 137  K 4.2  CL 101  CO2 26  GLUCOSE 105*  BUN 13  CREATININE 0.99  CALCIUM 9.1  MG 2.3   GFR: CrCl cannot be calculated (Unknown ideal weight.). Liver Function Tests: Recent Labs  Lab 10/04/22 1019  AST 28  ALT 19  ALKPHOS 92  BILITOT 1.8*  PROT 7.1  ALBUMIN 3.6   Recent Labs  Lab 10/04/22 1019  LIPASE 47   No results for input(s): "AMMONIA" in the last 168 hours. Coagulation Profile: No results for input(s): "INR", "PROTIME" in the last 168  hours. Cardiac Enzymes: No results for input(s): "CKTOTAL", "CKMB", "CKMBINDEX", "TROPONINI" in the last 168 hours. BNP (last 3 results) No results for input(s): "PROBNP" in the last 8760 hours. HbA1C: No results for input(s): "HGBA1C" in the last 72 hours. CBG: Recent Labs  Lab 10/04/22 1117  GLUCAP 110*   Lipid Profile: No results for input(s): "CHOL", "HDL", "LDLCALC", "TRIG", "CHOLHDL", "LDLDIRECT" in the last 72 hours. Thyroid Function Tests: No results for input(s): "TSH", "T4TOTAL", "FREET4", "T3FREE", "THYROIDAB" in the last 72 hours. Anemia Panel: No results for input(s): "VITAMINB12", "FOLATE", "FERRITIN", "TIBC", "IRON", "RETICCTPCT" in the last 72 hours. Urine analysis:    Component Value Date/Time   COLORURINE YELLOW 10/04/2022 1418   APPEARANCEUR CLEAR 10/04/2022 1418   LABSPEC 1.024 10/04/2022 1418   PHURINE 6.0 10/04/2022 1418   GLUCOSEU NEGATIVE 10/04/2022 1418   HGBUR NEGATIVE 10/04/2022 1418   BILIRUBINUR NEGATIVE 10/04/2022 1418   KETONESUR 5 (A) 10/04/2022 1418   PROTEINUR NEGATIVE 10/04/2022 1418   NITRITE NEGATIVE 10/04/2022 1418   LEUKOCYTESUR NEGATIVE 10/04/2022 1418    Radiological Exams on Admission: CT ABDOMEN PELVIS W CONTRAST  Result Date: 10/04/2022 CLINICAL DATA:  Abdominal pain, vomiting, history of Crohn's disease EXAM: CT ABDOMEN AND PELVIS WITH CONTRAST TECHNIQUE: Multidetector CT imaging of the abdomen and pelvis was performed using the standard protocol following bolus administration of intravenous contrast. RADIATION DOSE REDUCTION: This exam was performed according to the departmental dose-optimization program which includes automated exposure control, adjustment of the mA and/or kV according to patient size and/or use of iterative reconstruction technique. CONTRAST:  75mL OMNIPAQUE IOHEXOL 350 MG/ML SOLN COMPARISON:  04/08/2020 FINDINGS: Lower chest: Visualized lower lung fields are clear. Hepatobiliary: No focal abnormalities are seen in  liver. There is no dilation of bile ducts. Surgical clips are seen in gallbladder fossa. Pancreas: No focal abnormalities are seen. Spleen: Unremarkable. Adrenals/Urinary Tract: Adrenals are unremarkable. There is no hydronephrosis. There are no renal or ureteral stones. Urinary bladder is not distended. Stomach/Bowel: Stomach is unremarkable. There is abnormal dilation of small bowel loops measuring up to 5.4 cm in right lower quadrant. There is fecalization in the dilated distal small bowel loops. Surgical staples are seen in distal ileum. There is narrowing and wall thickening in distal ileum close to the ileocolic anastomosis. Length of narrowed distal ileum is a  proximally 5.7 cm. There is ileo ileal anastomosis in right lower quadrant. There is no significant dilation of colon. There is no pericolic stranding. Vascular/Lymphatic: Vascular structures are unremarkable. There are subcentimeter nodes in the mesentery with no change. Reproductive: Unremarkable. Other: There is no ascites or pneumoperitoneum. Musculoskeletal: No acute findings are seen. IMPRESSION: There is high-grade obstruction in distal small bowel. There is a 5.7 cm length distal ileum which is small in caliber with abnormal wall thickening extending from the area of ileo ileal anastomosis to the ileocolic anastomosis in right side of abdomen. Findings most likely suggest active Crohn's disease in distal ileum close to the ileocolic anastomosis with high-grade small bowel obstruction. There is no hydronephrosis. There is no ascites or pneumoperitoneum. Electronically Signed   By: Ernie Avena M.D.   On: 10/04/2022 15:16    EKG: Ordered  Assessment/Plan Principal Problem:   SBO (small bowel obstruction) (HCC) Active Problems:   Crohn's colitis (HCC)  (please populate well all problems here in Problem List. (For example, if patient is on BP meds at home and you resume or decide to hold them, it is a problem that needs to be her.  Same for CAD, COPD, HLD and so on)  SBO Acute Crohn's disease flareup -SBO likely secondary to acute flareup of Crohn's disease involving the portion of small intestine colon Between the 2 previous ileocecectomy and partial colectomy anastomosis -Surgical consultation appreciated -N.p.o., IV Dilaudid for pain IV Zofran for nausea -NGT, patient declined, clinically patient appears to be stable, will hold off NGT unless patient's symptoms became worse -Given the severity of flareup, decided to restart systemic steroid, Solu-Medrol 40 mg IV daily, continue Entocort, continue outpatient Remicade infusion vs  -Educated patient regarding importance of slow tapering of steroid in the future, patient agreed. [facility] consulted  Leukocytosis -Probably reactive to SBO and Crohn's disease.  Unclear whether antibiotics will benefit for current condition, hold off ABX   DVT prophylaxis: Lovenox Code Status: Full code Family Communication: Husband at bedside Disposition Plan: Expect less than 2 midnight hospital stay Consults called: General surgeon, [facility] Admission status: MedSurg observation   Emeline General MD [facility] Pager [phone]  10/04/2022, 6:07 PM

## 2022-10-04 NOTE — Consult Note (Signed)
Consult Note  Karen Knox Jun 12, 1965  [id number].    Requesting MD: Gloris Manchester, MD Chief Complaint/Reason for Consult: Crohn's with SBO HPI:  Patient is a 57 year old female who presented to the ED with abdominal pain that started around July 3 but significantly worsened today last night. Pain is cramping and now having nausea and vomiting. Last BM yesterday and was diarrhea which is normal for her. Unable to tolerate PO intake in the last 12 hrs. Patient was on Remicade for Crohn's but had a change in insurance that caused her to be without this for some time, she was able to restart 09/22/22. She states she was also on a budesonide taper. She follows with [facility], Dr. Lavon Paganini. Last colonoscopy Jan 2022. PMH otherwise significant for latent TB and Gilbert's syndrome. Prior abdominal surgery includes appendectomy, and colon/small bowel resections in 1993 and 2002. Not on blood thinners. NKDA.   ROS: Review of Systems  All other systems reviewed and are negative.   Family History  Problem Relation Age of Onset   Melanoma Mother    Breast cancer Mother 15   Arthritis Father        and on mothers side   Multiple sclerosis Father    Colon cancer Father 43   Lung cancer Father        smoker   Esophageal cancer Neg Hx    Rectal cancer Neg Hx    Stomach cancer Neg Hx     Past Medical History:  Diagnosis Date   Anal fissure    Arthritis    ? IBD related?   B12 deficiency    Crohn's disease (HCC)    External hemorrhoids    Gilbert's syndrome    Internal hemorrhoids    Iron deficiency anemia    Small bowel stricture (HCC)    multiple 07/05/00   TB (tuberculosis)    "latent"- was treated because pt was on Remicade; October 2016    Past Surgical History:  Procedure Laterality Date   APPENDECTOMY     with initial colon resection including terminal ileum   BREAST BIOPSY Right 06/04/2022   MM RT BREAST BX W LOC DEV 1ST LESION IMAGE BX SPEC STEREO GUIDE 06/04/2022  [facility] MAMMOGRAPHY   CHOLECYSTECTOMY     COLON RESECTION  1993, 2002   x2   COLONOSCOPY     EXPLORATORY LAPAROTOMY  2002   with extensive lysis of adhesions, resection of ileocolonic anastomosis,resection segment small bowel; anastomosis of small bowel  to small bowel; ileocolic anastomosis   UPPER GASTROINTESTINAL ENDOSCOPY      Social History:  reports that she has never smoked. She has never used smokeless tobacco. She reports current alcohol use. She reports that she does not use drugs.  Allergies: No Known Allergies  (Not in a hospital admission)   Blood pressure (!) 146/87, pulse 62, temperature 98.3 F (36.8 C), temperature source Oral, resp. rate 16, last menstrual period 02/17/2020, SpO2 97%. Physical Exam:  General: pleasant, WD, female who is laying in bed in NAD HEENT: head is normocephalic, atraumatic.  Sclera are noninjected.  PERRL.  Ears and nose without any masses or lesions.  Mouth is pink and moist Heart: regular, rate, and rhythm.  Normal s1,s2. No obvious murmurs, gallops, or rubs noted. No lower extremity edema Lungs: Respiratory effort nonlabored Abd: soft, mild distention, lower abdomen is firm but not rigid, ttp LLQ and RLQ. Previous RLQ incision appears well-healing. MS: all 4  extremities are symmetrical with no cyanosis, clubbing, or edema. Skin: warm and dry with no masses, lesions, or rashes Neuro: Cranial nerves 2-12 grossly intact, sensation is normal throughout Psych: A&Ox3 with an appropriate affect.   Results for orders placed or performed during the hospital encounter of 10/04/22 (from the past 48 hour(s))  Comprehensive metabolic panel     Status: Abnormal   Collection Time: 10/04/22 10:19 AM  Result Value Ref Range   Sodium 137 135 - 145 mmol/L   Potassium 4.2 3.5 - 5.1 mmol/L   Chloride 101 98 - 111 mmol/L   CO2 26 22 - 32 mmol/L   Glucose, Bld 105 (H) 70 - 99 mg/dL    Comment: Glucose reference range applies only to samples taken after  fasting for at least 8 hours.   BUN 13 6 - 20 mg/dL   Creatinine, Ser 0.10 0.44 - 1.00 mg/dL   Calcium 9.1 8.9 - 27.2 mg/dL   Total Protein 7.1 6.5 - 8.1 g/dL   Albumin 3.6 3.5 - 5.0 g/dL   AST 28 15 - 41 U/L   ALT 19 0 - 44 U/L   Alkaline Phosphatase 92 38 - 126 U/L   Total Bilirubin 1.8 (H) 0.3 - 1.2 mg/dL   GFR, Estimated >53 >66 mL/min    Comment: (NOTE) Calculated using the CKD-EPI Creatinine Equation (2021)    Anion gap 10 5 - 15    Comment: Performed at [facility], [address], [place], [place] [address]  Lipase, blood     Status: None   Collection Time: 10/04/22 10:19 AM  Result Value Ref Range   Lipase 47 11 - 51 U/L    Comment: Performed at [facility], [address], [place], [place] [address]  CBC with Differential     Status: Abnormal   Collection Time: 10/04/22 10:19 AM  Result Value Ref Range   WBC 20.0 (H) 4.0 - 10.5 K/uL   RBC 5.39 (H) 3.87 - 5.11 MIL/uL   Hemoglobin 14.6 12.0 - 15.0 g/dL   HCT 56.3 (H) 87.5 - 64.3 %   MCV 85.5 80.0 - 100.0 fL   MCH 27.1 26.0 - 34.0 pg   MCHC 31.7 30.0 - 36.0 g/dL   RDW 32.9 51.8 - 84.1 %   Platelets 400 150 - 400 K/uL   nRBC 0.0 0.0 - 0.2 %   Neutrophils Relative % 81 %   Neutro Abs 16.2 (H) 1.7 - 7.7 K/uL   Lymphocytes Relative 10 %   Lymphs Abs 2.0 0.7 - 4.0 K/uL   Monocytes Relative 9 %   Monocytes Absolute 1.7 (H) 0.1 - 1.0 K/uL   Eosinophils Relative 0 %   Eosinophils Absolute 0.0 0.0 - 0.5 K/uL   Basophils Relative 0 %   Basophils Absolute 0.0 0.0 - 0.1 K/uL   Immature Granulocytes 0 %   Abs Immature Granulocytes 0.08 (H) 0.00 - 0.07 K/uL    Comment: Performed at [facility], [address] [place], [place] [address]  hCG, serum, qualitative     Status: None   Collection Time: 10/04/22 10:19 AM  Result Value Ref Range   Preg, Serum NEGATIVE NEGATIVE    Comment:        THE SENSITIVITY OF THIS METHODOLOGY IS >10 mIU/mL. Performed at [facility], [address],  [place], [place] [address]   Magnesium     Status: None   Collection Time: 10/04/22 10:19 AM  Result Value Ref Range   Magnesium 2.3 1.7 - 2.4 mg/dL    Comment: Performed at [facility], [address] [place], [place] [address]  CBG monitoring, ED     Status: Abnormal   Collection Time: 10/04/22 11:17 AM  Result Value Ref Range   Glucose-Capillary 110 (H) 70 - 99 mg/dL    Comment: Glucose reference range applies only to samples taken after fasting for at least 8 hours.   Comment 1 Notify RN    Comment 2 Document in Chart   Urinalysis, Routine w reflex microscopic -Urine, Clean Catch     Status: Abnormal   Collection Time: 10/04/22  2:18 PM  Result Value Ref Range   Color, Urine YELLOW YELLOW   APPearance CLEAR CLEAR   Specific Gravity, Urine 1.024 1.005 - 1.030   pH 6.0 5.0 - 8.0   Glucose, UA NEGATIVE NEGATIVE mg/dL   Hgb urine dipstick NEGATIVE NEGATIVE   Bilirubin Urine NEGATIVE NEGATIVE   Ketones, ur 5 (A) NEGATIVE mg/dL   Protein, ur NEGATIVE NEGATIVE mg/dL   Nitrite NEGATIVE NEGATIVE   Leukocytes,Ua NEGATIVE NEGATIVE    Comment: Performed at [facility], [address], [place], [place] [address]   CT ABDOMEN PELVIS W CONTRAST  Result Date: 10/04/2022 CLINICAL DATA:  Abdominal pain, vomiting, history of Crohn's disease EXAM: CT ABDOMEN AND PELVIS WITH CONTRAST TECHNIQUE: Multidetector CT imaging of the abdomen and pelvis was performed using the standard protocol following bolus administration of intravenous contrast. RADIATION DOSE REDUCTION: This exam was performed according to the departmental dose-optimization program which includes automated exposure control, adjustment of the mA and/or kV according to patient size and/or use of iterative reconstruction technique. CONTRAST:  75mL OMNIPAQUE IOHEXOL 350 MG/ML SOLN COMPARISON:  04/08/2020 FINDINGS: Lower chest: Visualized lower lung fields are clear. Hepatobiliary: No focal abnormalities are seen in liver. There is no  dilation of bile ducts. Surgical clips are seen in gallbladder fossa. Pancreas: No focal abnormalities are seen. Spleen: Unremarkable. Adrenals/Urinary Tract: Adrenals are unremarkable. There is no hydronephrosis. There are no renal or ureteral stones. Urinary bladder is not distended. Stomach/Bowel: Stomach is unremarkable. There is abnormal dilation of small bowel loops measuring up to 5.4 cm in right lower quadrant. There is fecalization in the dilated distal small bowel loops. Surgical staples are seen in distal ileum. There is narrowing and wall thickening in distal ileum close to the ileocolic anastomosis. Length of narrowed distal ileum is a proximally 5.7 cm. There is ileo ileal anastomosis in right lower quadrant. There is no significant dilation of colon. There is no pericolic stranding. Vascular/Lymphatic: Vascular structures are unremarkable. There are subcentimeter nodes in the mesentery with no change. Reproductive: Unremarkable. Other: There is no ascites or pneumoperitoneum. Musculoskeletal: No acute findings are seen. IMPRESSION: There is high-grade obstruction in distal small bowel. There is a 5.7 cm length distal ileum which is small in caliber with abnormal wall thickening extending from the area of ileo ileal anastomosis to the ileocolic anastomosis in right side of abdomen. Findings most likely suggest active Crohn's disease in distal ileum close to the ileocolic anastomosis with high-grade small bowel obstruction. There is no hydronephrosis. There is no ascites or pneumoperitoneum. Electronically Signed   By: Ernie Avena M.D.   On: 10/04/2022 15:16      Assessment/Plan Crohn's disease with SBO - CT with above and 5.7 cm length of distal ileum that appears thickened near prior ileocolic anastomosis - WBC 20, Afebrile and HD  stable  - patient without nausea/vomiting currently, she threw up once this morning. Ok to hold off on NG tube placement but would order NG tube if she has  another episode of vomiting. - recommend GI consult - [facility] will follow. No emergent role for surgery today  FEN: NPO, IVF, would maximize IV meds and avoid PO meds as able VTE:SCD's, ok for lovenox ID: none currently  I reviewed ED provider notes, last 24 h vitals and pain scores, last 48 h intake and output, last 24 h labs and trends, and last 24 h imaging results.   Hosie Spangle, [facility] 10/04/2022, 3:38 PM Please see Amion for pager number during day hours 7:00am-4:30pm

## 2022-10-04 NOTE — Plan of Care (Signed)
Patient alert/oriented X4. Patient VSS on admission and skin assessed with Gilman Buttner, RN. No skin issues noted. Patient showed up to floor without NG tube placement, MD aware. Patient complains of minimal pain in abdominal area and remains NPO. Oncoming shift nurse will be notified patient is NPO. Patient tolerating cont. Fluids. Will continue to monitor.

## 2022-10-04 NOTE — ED Notes (Signed)
ED TO INPATIENT HANDOFF REPORT  ED Nurse Name and Phone #: Leavy Cella [phone]  S Name/Age/Gender Karen Knox Karen Knox 57 y.o. female Room/Bed: 046C/046C  Code Status   Code Status: Full Code  Home/SNF/Other Home Patient oriented to: self, place, time, and situation Is this baseline? Yes   Triage Complete: Triage complete  Chief Complaint SBO (small bowel obstruction) (HCC) [K56.609]  Triage Note Pt with hx of Crohn's disease here for eval of abdominal pain x 2 days. No BM in a couple of days but vomiting today. Also has cramping in legs. Recent changes in medications and delay in getting her remicade. Has felt somewhat better after vomiting this morning but states unable to tolerate PO intake.    Allergies No Known Allergies  Level of Care/Admitting Diagnosis ED Disposition     ED Disposition  Admit   Condition  --   Comment  Hospital Area: [facility] [[id number]]  Level of Care: Med-Surg [16]  May place patient in observation at [facility] or Karen Knox if equivalent level of care is available:: No  Covid Evaluation: Asymptomatic - no recent exposure (last 10 days) testing not required  Diagnosis: SBO (small bowel obstruction) [facility]) [[id number]]  Admitting Physician: Emeline General [[id number]]  Attending Physician: Emeline General [[id number]]          B Medical/Surgery History Past Medical History:  Diagnosis Date   Anal fissure    Arthritis    ? IBD related?   B12 deficiency    Crohn's disease (HCC)    External hemorrhoids    Gilbert's syndrome    Internal hemorrhoids    Iron deficiency anemia    Small bowel stricture (HCC)    multiple 07/05/00   TB (tuberculosis)    "latent"- was treated because pt was on Remicade; October 2016   Past Surgical History:  Procedure Laterality Date   APPENDECTOMY     with initial colon resection including terminal ileum   BREAST BIOPSY Right 06/04/2022   MM RT BREAST BX W LOC DEV 1ST LESION IMAGE BX SPEC STEREO  GUIDE 06/04/2022 [facility] MAMMOGRAPHY   CHOLECYSTECTOMY     COLON RESECTION  1993, 2002   x2   COLONOSCOPY     EXPLORATORY LAPAROTOMY  2002   with extensive lysis of adhesions, resection of ileocolonic anastomosis,resection segment small bowel; anastomosis of small bowel  to small bowel; ileocolic anastomosis   UPPER GASTROINTESTINAL ENDOSCOPY       A IV Location/Drains/Wounds Patient Lines/Drains/Airways Status     Active Line/Drains/Airways     Name Placement date Placement time Site Days   Peripheral IV 10/04/22 22 G Right Antecubital 10/04/22  1156  Antecubital  less than 1            Intake/Output Last 24 hours No intake or output data in the 24 hours ending 10/04/22 1743  Labs/Imaging Results for orders placed or performed during the hospital encounter of 10/04/22 (from the past 48 hour(s))  Comprehensive metabolic panel     Status: Abnormal   Collection Time: 10/04/22 10:19 AM  Result Value Ref Range   Sodium 137 135 - 145 mmol/L   Potassium 4.2 3.5 - 5.1 mmol/L   Chloride 101 98 - 111 mmol/L   CO2 26 22 - 32 mmol/L   Glucose, Bld 105 (H) 70 - 99 mg/dL    Comment: Glucose reference range applies only to samples taken after fasting for at least 8 hours.   BUN 13 6 -  20 mg/dL   Creatinine, Ser 3.01 0.44 - 1.00 mg/dL   Calcium 9.1 8.9 - 60.1 mg/dL   Total Protein 7.1 6.5 - 8.1 g/dL   Albumin 3.6 3.5 - 5.0 g/dL   AST 28 15 - 41 U/L   ALT 19 0 - 44 U/L   Alkaline Phosphatase 92 38 - 126 U/L   Total Bilirubin 1.8 (H) 0.3 - 1.2 mg/dL   GFR, Estimated >09 >32 mL/min    Comment: (NOTE) Calculated using the CKD-EPI Creatinine Equation (2021)    Anion gap 10 5 - 15    Comment: Performed at [facility], [address], [place], [place] [address]  Lipase, blood     Status: None   Collection Time: 10/04/22 10:19 AM  Result Value Ref Range   Lipase 47 11 - 51 U/L    Comment: Performed at [facility], [address], [place], [place] [address]  CBC with  Differential     Status: Abnormal   Collection Time: 10/04/22 10:19 AM  Result Value Ref Range   WBC 20.0 (H) 4.0 - 10.5 K/uL   RBC 5.39 (H) 3.87 - 5.11 MIL/uL   Hemoglobin 14.6 12.0 - 15.0 g/dL   HCT 42.7 (H) 06.2 - 37.6 %   MCV 85.5 80.0 - 100.0 fL   MCH 27.1 26.0 - 34.0 pg   MCHC 31.7 30.0 - 36.0 g/dL   RDW 28.3 15.1 - 76.1 %   Platelets 400 150 - 400 K/uL   nRBC 0.0 0.0 - 0.2 %   Neutrophils Relative % 81 %   Neutro Abs 16.2 (H) 1.7 - 7.7 K/uL   Lymphocytes Relative 10 %   Lymphs Abs 2.0 0.7 - 4.0 K/uL   Monocytes Relative 9 %   Monocytes Absolute 1.7 (H) 0.1 - 1.0 K/uL   Eosinophils Relative 0 %   Eosinophils Absolute 0.0 0.0 - 0.5 K/uL   Basophils Relative 0 %   Basophils Absolute 0.0 0.0 - 0.1 K/uL   Immature Granulocytes 0 %   Abs Immature Granulocytes 0.08 (H) 0.00 - 0.07 K/uL    Comment: Performed at [facility], [address], [place], [place] [address]  hCG, serum, qualitative     Status: None   Collection Time: 10/04/22 10:19 AM  Result Value Ref Range   Preg, Serum NEGATIVE NEGATIVE    Comment:        THE SENSITIVITY OF THIS METHODOLOGY IS >10 mIU/mL. Performed at [facility], [address] [place], [place] [address]   Magnesium     Status: None   Collection Time: 10/04/22 10:19 AM  Result Value Ref Range   Magnesium 2.3 1.7 - 2.4 mg/dL    Comment: Performed at [facility], [address], [place], [place] [address]  CBG monitoring, ED     Status: Abnormal   Collection Time: 10/04/22 11:17 AM  Result Value Ref Range   Glucose-Capillary 110 (H) 70 - 99 mg/dL    Comment: Glucose reference range applies only to samples taken after fasting for at least 8 hours.   Comment 1 Notify RN    Comment 2 Document in Chart   Urinalysis, Routine w reflex microscopic -Urine, Clean Catch     Status: Abnormal   Collection Time: 10/04/22  2:18 PM  Result Value Ref Range   Color, Urine YELLOW YELLOW   APPearance CLEAR CLEAR   Specific Gravity,  Urine 1.024 1.005 - 1.030  pH 6.0 5.0 - 8.0   Glucose, UA NEGATIVE NEGATIVE mg/dL   Hgb urine dipstick NEGATIVE NEGATIVE   Bilirubin Urine NEGATIVE NEGATIVE   Ketones, ur 5 (A) NEGATIVE mg/dL   Protein, ur NEGATIVE NEGATIVE mg/dL   Nitrite NEGATIVE NEGATIVE   Leukocytes,Ua NEGATIVE NEGATIVE    Comment: Performed at [facility], [address] [place], [place] [address]   CT ABDOMEN PELVIS W CONTRAST  Result Date: 10/04/2022 CLINICAL DATA:  Abdominal pain, vomiting, history of Crohn's disease EXAM: CT ABDOMEN AND PELVIS WITH CONTRAST TECHNIQUE: Multidetector CT imaging of the abdomen and pelvis was performed using the standard protocol following bolus administration of intravenous contrast. RADIATION DOSE REDUCTION: This exam was performed according to the departmental dose-optimization program which includes automated exposure control, adjustment of the mA and/or kV according to patient size and/or use of iterative reconstruction technique. CONTRAST:  75mL OMNIPAQUE IOHEXOL 350 MG/ML SOLN COMPARISON:  04/08/2020 FINDINGS: Lower chest: Visualized lower lung fields are clear. Hepatobiliary: No focal abnormalities are seen in liver. There is no dilation of bile ducts. Surgical clips are seen in gallbladder fossa. Pancreas: No focal abnormalities are seen. Spleen: Unremarkable. Adrenals/Urinary Tract: Adrenals are unremarkable. There is no hydronephrosis. There are no renal or ureteral stones. Urinary bladder is not distended. Stomach/Bowel: Stomach is unremarkable. There is abnormal dilation of small bowel loops measuring up to 5.4 cm in right lower quadrant. There is fecalization in the dilated distal small bowel loops. Surgical staples are seen in distal ileum. There is narrowing and wall thickening in distal ileum close to the ileocolic anastomosis. Length of narrowed distal ileum is a proximally 5.7 cm. There is ileo ileal anastomosis in right lower quadrant. There is no significant dilation  of colon. There is no pericolic stranding. Vascular/Lymphatic: Vascular structures are unremarkable. There are subcentimeter nodes in the mesentery with no change. Reproductive: Unremarkable. Other: There is no ascites or pneumoperitoneum. Musculoskeletal: No acute findings are seen. IMPRESSION: There is high-grade obstruction in distal small bowel. There is a 5.7 cm length distal ileum which is small in caliber with abnormal wall thickening extending from the area of ileo ileal anastomosis to the ileocolic anastomosis in right side of abdomen. Findings most likely suggest active Crohn's disease in distal ileum close to the ileocolic anastomosis with high-grade small bowel obstruction. There is no hydronephrosis. There is no ascites or pneumoperitoneum. Electronically Signed   By: Ernie Avena M.D.   On: 10/04/2022 15:16    Pending Labs Unresulted Labs (From admission, onward)     Start     Ordered   10/05/22 0500  CBC  Tomorrow morning,   R        10/04/22 1719            Vitals/Pain Today's Vitals   10/04/22 1013 10/04/22 1016 10/04/22 1345  BP: (!) 150/98  (!) 146/87  Pulse: 60  62  Resp: 16  16  Temp: 97.6 F (36.4 C)  98.3 F (36.8 C)  TempSrc: Oral  Oral  SpO2: 100%  97%  PainSc:  5  4     Isolation Precautions No active isolations  Medications Medications  lidocaine (XYLOCAINE) 2 % jelly 1 Application (1 Application Topical Not Given 10/04/22 1646)  cholestyramine light (PREVALITE) powder 4 g (has no administration in time range)  budesonide (ENTOCORT EC) 24 hr capsule 3 mg (has no administration in time range)  clobetasol (TEMOVATE) 0.05 % external solution (has no administration in time range)  ofloxacin (OCUFLOX)  0.3 % ophthalmic solution 5 drop (has no administration in time range)  methylPREDNISolone sodium succinate (SOLU-MEDROL) 40 mg/mL injection 40 mg (has no administration in time range)  enoxaparin (LOVENOX) injection 40 mg (has no administration in  time range)  0.9 %  sodium chloride infusion (has no administration in time range)  HYDROmorphone (DILAUDID) injection 0.5-1 mg (has no administration in time range)  ondansetron (ZOFRAN) tablet 4 mg (has no administration in time range)    Or  ondansetron (ZOFRAN) injection 4 mg (has no administration in time range)  sodium chloride 0.9 % bolus 1,000 mL (0 mLs Intravenous Stopped 10/04/22 1424)  iohexol (OMNIPAQUE) 350 MG/ML injection 75 mL (75 mLs Intravenous Contrast Given 10/04/22 1436)    Mobility walks     Focused Assessments Cardiac Assessment Handoff:    No results found for: "CKTOTAL", "CKMB", "CKMBINDEX", "TROPONINI" No results found for: "DDIMER" Does the Patient currently have chest pain? NO   R Recommendations: See Admitting Provider Note  Report given to:   Additional Notes: see admission orders

## 2022-10-04 NOTE — ED Provider Notes (Signed)
[place] [facility]ERGENCY DEPARTMENT AT [facility] Provider Note   CSN: [id number] Arrival date & time: 10/04/22  1008     History  Chief Complaint  Patient presents with   Abdominal Pain    Karen Knox is a 57 y.o. female.  With past medical history of Crohn's disease complicated by small bowel stricture, colon resection, iron deficiency anemia, Gilbert's syndrome who presents to the emergency department with abdominal pain.  States that she is having worsening abdominal pain, cramping and now vomiting over the past 12 hours.  She states that really symptoms have been worsening over the past week but over the past 12 hours has progressed to vomiting and she feels now that her bowels are not moving.  She last had a bowel movement about 24 hours ago which she states was diarrhea which is usual for her.  She was nauseated overnight and then had a number of bouts of vomiting this morning.  Not currently nauseated.  She is also complaining of leg cramping, which is not usual for her Crohn's exacerbations.  Has been unable to tolerate p.o. over the past 12 hours.  She denies having any fevers, dysuria, vaginal discharge.  She states that over the past few months she has had a change in insurance which caused her to be without her Remicade for a period of time.  She states that around 4 July she was able to restart her Remicade and had a brief lapse in her budesonide which she is now currently taking.  She is followed by GI with [facility].   Abdominal Pain Associated symptoms: nausea and vomiting        Home Medications Prior to Admission medications   Medication Sig Start Date End Date Taking? Authorizing Provider  budesonide (ENTOCORT EC) 3 MG 24 hr capsule Take 1 capsule (3 mg total) by mouth daily. 08/27/22   Napoleon Form, MD  cholestyramine light (PREVALITE) 4 GM/DOSE powder Take 1 packet (4 g total) by mouth daily. Patient taking differently: Take 4 g by mouth daily as  needed. 01/23/21   Nandigam, Eleonore Chiquito, MD  clobetasol (TEMOVATE) 0.05 % external solution APPLY TO AFFECTED AREA TWICE A DAY AS NEEDED FOR RASH ON BODY DO NOT USE ON FACE OR GROIN AREA    [provider]  cyanocobalamin (VITAMIN B12) 1000 MCG/ML injection INJECT 1 ML (1,000 MCG TOTAL) INTO THE MUSCLE EVERY 30 DAYS. 11/29/21   Napoleon Form, MD  cyanocobalamin (VITAMIN B12) 1000 MCG/ML injection Inject 1 mL (1,000 mcg total) into the muscle every 30 (thirty) days. 11/18/21   Napoleon Form, MD  inFLIXimab (REMICADE) 100 MG injection 10mg /kg every 6 weeks 09/06/22   Napoleon Form, MD  ofloxacin (FLOXIN OTIC) 0.3 % OTIC solution Place 5 drops into the right ear daily. 11/11/21   Jarold Motto, PA  predniSONE (DELTASONE) 10 MG tablet Take 1 tablet (10 mg total) by mouth daily with breakfast. 08/27/22   Napoleon Form, MD  predniSONE (DELTASONE) 5 MG tablet Take 1 tablet (5 mg total) by mouth daily with breakfast. 08/27/22   Nandigam, Eleonore Chiquito, MD  Vitamin D, Ergocalciferol, (DRISDOL) 1.25 MG (50000 UNIT) CAPS capsule TAKE 1 CAPSULE (50,000 UNITS TOTAL) BY MOUTH EVERY 7 (SEVEN) DAYS 02/08/22   Sheliah Hatch, MD      Allergies    Patient has no known allergies.    Review of Systems   Review of Systems  Gastrointestinal:  Positive for abdominal pain,  nausea and vomiting.  All other systems reviewed and are negative.   Physical Exam Updated Vital Signs BP (!) 146/87   Pulse 62   Temp 98.3 F (36.8 C) (Oral)   Resp 16   LMP 02/17/2020   SpO2 97%  Physical Exam Vitals and nursing note reviewed.  Constitutional:      General: She is not in acute distress.    Appearance: Normal appearance. She is well-developed. She is not ill-appearing or toxic-appearing.  HENT:     Head: Normocephalic.  Eyes:     General: No scleral icterus. Cardiovascular:     Rate and Rhythm: Normal rate and regular rhythm.     Heart sounds: Normal heart sounds. No murmur  heard. Pulmonary:     Effort: Pulmonary effort is normal.     Breath sounds: Normal breath sounds.  Abdominal:     General: Abdomen is flat. Bowel sounds are decreased. There is no distension.     Palpations: Abdomen is soft.     Tenderness: There is generalized abdominal tenderness. There is no guarding or rebound.     Comments: Bowel sounds were present on the right  Skin:    General: Skin is warm and dry.     Capillary Refill: Capillary refill takes less than 2 seconds.  Neurological:     General: No focal deficit present.     Mental Status: She is alert.  Psychiatric:        Mood and Affect: Mood normal.        Behavior: Behavior normal.     ED Results / Procedures / Treatments   Labs (all labs ordered are listed, but only abnormal results are displayed) Labs Reviewed  COMPREHENSIVE METABOLIC PANEL - Abnormal; Notable for the following components:      Result Value   Glucose, Bld 105 (*)    Total Bilirubin 1.8 (*)    All other components within normal limits  CBC WITH DIFFERENTIAL/PLATELET - Abnormal; Notable for the following components:   WBC 20.0 (*)    RBC 5.39 (*)    HCT 46.1 (*)    Neutro Abs 16.2 (*)    Monocytes Absolute 1.7 (*)    Abs Immature Granulocytes 0.08 (*)    All other components within normal limits  URINALYSIS, ROUTINE W REFLEX MICROSCOPIC - Abnormal; Notable for the following components:   Ketones, ur 5 (*)    All other components within normal limits  CBG MONITORING, ED - Abnormal; Notable for the following components:   Glucose-Capillary 110 (*)    All other components within normal limits  LIPASE, BLOOD  HCG, SERUM, QUALITATIVE  MAGNESIUM    EKG None  Radiology CT ABDOMEN PELVIS W CONTRAST  Result Date: 10/04/2022 CLINICAL DATA:  Abdominal pain, vomiting, history of Crohn's disease EXAM: CT ABDOMEN AND PELVIS WITH CONTRAST TECHNIQUE: Multidetector CT imaging of the abdomen and pelvis was performed using the standard protocol following  bolus administration of intravenous contrast. RADIATION DOSE REDUCTION: This exam was performed according to the departmental dose-optimization program which includes automated exposure control, adjustment of the mA and/or kV according to patient size and/or use of iterative reconstruction technique. CONTRAST:  75mL OMNIPAQUE IOHEXOL 350 MG/ML SOLN COMPARISON:  04/08/2020 FINDINGS: Lower chest: Visualized lower lung fields are clear. Hepatobiliary: No focal abnormalities are seen in liver. There is no dilation of bile ducts. Surgical clips are seen in gallbladder fossa. Pancreas: No focal abnormalities are seen. Spleen: Unremarkable. Adrenals/Urinary Tract: Adrenals are unremarkable. There is  no hydronephrosis. There are no renal or ureteral stones. Urinary bladder is not distended. Stomach/Bowel: Stomach is unremarkable. There is abnormal dilation of small bowel loops measuring up to 5.4 cm in right lower quadrant. There is fecalization in the dilated distal small bowel loops. Surgical staples are seen in distal ileum. There is narrowing and wall thickening in distal ileum close to the ileocolic anastomosis. Length of narrowed distal ileum is a proximally 5.7 cm. There is ileo ileal anastomosis in right lower quadrant. There is no significant dilation of colon. There is no pericolic stranding. Vascular/Lymphatic: Vascular structures are unremarkable. There are subcentimeter nodes in the mesentery with no change. Reproductive: Unremarkable. Other: There is no ascites or pneumoperitoneum. Musculoskeletal: No acute findings are seen. IMPRESSION: There is high-grade obstruction in distal small bowel. There is a 5.7 cm length distal ileum which is small in caliber with abnormal wall thickening extending from the area of ileo ileal anastomosis to the ileocolic anastomosis in right side of abdomen. Findings most likely suggest active Crohn's disease in distal ileum close to the ileocolic anastomosis with high-grade small  bowel obstruction. There is no hydronephrosis. There is no ascites or pneumoperitoneum. Electronically Signed   By: Ernie Avena M.D.   On: 10/04/2022 15:16    Procedures Procedures   Medications Ordered in ED Medications  lidocaine (XYLOCAINE) 2 % jelly 1 Application (has no administration in time range)  sodium chloride 0.9 % bolus 1,000 mL (0 mLs Intravenous Stopped 10/04/22 1424)  iohexol (OMNIPAQUE) 350 MG/ML injection 75 mL (75 mLs Intravenous Contrast Given 10/04/22 1436)    ED Course/ Medical Decision Making/ A&P    Medical Decision Making Amount and/or Complexity of Data Reviewed Labs: ordered. Radiology: ordered.  Risk Prescription drug management.  Initial Impression and Ddx 57 year old female who presents to the emergency department with abdominal pain Patient PMH that increases complexity of ED encounter: Crohn's disease, Gilbert's syndrome, iron deficiency anemia Differential: Acute hepatobiliary disease, pancreatitis, appendicitis, PUD, gastritis, SBO, diverticulitis, colitis, viral gastroenteritis, Crohn's, UC, vascular catastrophe, UTI, pyelonephritis, renal stone, obstructed stone, infected stone, ovarian torsion, ectopic pregnancy, TOA, PID, STD, etc.   Interpretation of Diagnostics I independent reviewed and interpreted the labs as followed: cbc 20, unclear if this is from budesonide or acute. Minimal shift. Cmp without electrolyte derangment. Not pregnant. Lipase negative. Ua negative.  - I independently visualized the following imaging with scope of interpretation limited to determining acute life threatening conditions related to emergency care: CT AP, which revealed high grade SBO  Patient Reassessment and Ultimate Disposition/Management 57 year old female who presents to the emergency department with abdominal pain.  Overall she is well-appearing, nonseptic and nontoxic in appearance.  She is hemodynamically stable without fever. She has mild  generalized abdominal tenderness without peritonitic findings. Less concerned about a small bowel obstruction at this time given that she is having worsening pain, vomiting this morning.  Will obtain labs including electrolytes given her leg cramping as well.  Will start her with IV fluids.  Will get a CT scan.  She does not want anything for pain or nausea at this time.  Will reevaluate  1335: Leg cramping symptoms improving with IV fluids.  WBCs 20, however on chronic steroids.  Lipase, CMP are within normal limits.  Still pending CT abdomen/pelvis.  Does not want any pain or nausea medication at this time.  CT resulted with high grade SBO. Not getting NGT at this time as she is not having vomiting. Consulting general surgery. They are at  bedside to speak with her about options.  Labs with leukocytosis. Again ?budesonide vs r/t obstruction. No fever. Doubt infection.   Will need admission for GI/general surgery management of Crohn's related SBO.   Dr. Willeen Cass, PGY-2 ED attending to handle recommendations from general surgery.   Patient management required discussion with the following services or consulting groups:  General/Trauma Surgery  Complexity of Problems Addressed Acute complicated illness or Injury  Additional Data Reviewed and Analyzed Further history obtained from: Further history from spouse/family member, Past medical history and medications listed in the EMR, Prior ED visit notes, Care Everywhere, and Prior labs/imaging results  Patient Encounter Risk Assessment SDOH impact on management and Consideration of hospitalization  Final Clinical Impression(s) / ED Diagnoses Final diagnoses:  Small bowel obstruction [facility])    Rx / DC Orders ED Discharge Orders     None         Cristopher Peru, PA-C 10/04/22 1550    Gloris Manchester, MD 10/05/22 1703

## 2022-10-05 DIAGNOSIS — Z820 Family history of epilepsy and other diseases of the nervous system: Secondary | ICD-10-CM | POA: Diagnosis not present

## 2022-10-05 DIAGNOSIS — D509 Iron deficiency anemia, unspecified: Secondary | ICD-10-CM | POA: Diagnosis not present

## 2022-10-05 DIAGNOSIS — E785 Hyperlipidemia, unspecified: Secondary | ICD-10-CM | POA: Diagnosis present

## 2022-10-05 DIAGNOSIS — Z808 Family history of malignant neoplasm of other organs or systems: Secondary | ICD-10-CM | POA: Diagnosis not present

## 2022-10-05 DIAGNOSIS — Z800 Family history of malignant neoplasm of digestive organs: Secondary | ICD-10-CM | POA: Diagnosis not present

## 2022-10-05 DIAGNOSIS — K56609 Unspecified intestinal obstruction, unspecified as to partial versus complete obstruction: Secondary | ICD-10-CM

## 2022-10-05 DIAGNOSIS — Z801 Family history of malignant neoplasm of trachea, bronchus and lung: Secondary | ICD-10-CM | POA: Diagnosis not present

## 2022-10-05 DIAGNOSIS — E611 Iron deficiency: Secondary | ICD-10-CM | POA: Diagnosis present

## 2022-10-05 DIAGNOSIS — Z8615 Personal history of latent tuberculosis infection: Secondary | ICD-10-CM | POA: Diagnosis not present

## 2022-10-05 DIAGNOSIS — K50912 Crohn's disease, unspecified, with intestinal obstruction: Secondary | ICD-10-CM | POA: Diagnosis present

## 2022-10-05 DIAGNOSIS — Z79899 Other long term (current) drug therapy: Secondary | ICD-10-CM | POA: Diagnosis not present

## 2022-10-05 DIAGNOSIS — Z79620 Long term (current) use of immunosuppressive biologic: Secondary | ICD-10-CM | POA: Diagnosis not present

## 2022-10-05 DIAGNOSIS — K50012 Crohn's disease of small intestine with intestinal obstruction: Secondary | ICD-10-CM | POA: Diagnosis not present

## 2022-10-05 DIAGNOSIS — Z8261 Family history of arthritis: Secondary | ICD-10-CM | POA: Diagnosis not present

## 2022-10-05 DIAGNOSIS — Z9049 Acquired absence of other specified parts of digestive tract: Secondary | ICD-10-CM | POA: Diagnosis not present

## 2022-10-05 DIAGNOSIS — E861 Hypovolemia: Secondary | ICD-10-CM | POA: Diagnosis present

## 2022-10-05 DIAGNOSIS — K50812 Crohn's disease of both small and large intestine with intestinal obstruction: Secondary | ICD-10-CM | POA: Diagnosis present

## 2022-10-05 DIAGNOSIS — Z803 Family history of malignant neoplasm of breast: Secondary | ICD-10-CM | POA: Diagnosis not present

## 2022-10-05 DIAGNOSIS — N179 Acute kidney failure, unspecified: Secondary | ICD-10-CM | POA: Diagnosis present

## 2022-10-05 LAB — CBC
HCT: 40.4 % (ref 36.0–46.0)
Hemoglobin: 12.6 g/dL (ref 12.0–15.0)
MCH: 26.1 pg (ref 26.0–34.0)
MCHC: 31.2 g/dL (ref 30.0–36.0)
MCV: 83.6 fL (ref 80.0–100.0)
Platelets: 348 10*3/uL (ref 150–400)
RBC: 4.83 MIL/uL (ref 3.87–5.11)
RDW: 13.6 % (ref 11.5–15.5)
WBC: 13.2 10*3/uL — ABNORMAL HIGH (ref 4.0–10.5)
nRBC: 0 % (ref 0.0–0.2)

## 2022-10-05 LAB — HEPATITIS B SURFACE ANTIGEN: Hepatitis B Surface Ag: NONREACTIVE

## 2022-10-05 LAB — C-REACTIVE PROTEIN: CRP: 0.6 mg/dL (ref <1.0)

## 2022-10-05 LAB — HEPATITIS B CORE ANTIBODY, TOTAL: Hep B Core Total Ab: NONREACTIVE

## 2022-10-05 LAB — SEDIMENTATION RATE: Sed Rate: 8 mm/hr (ref 0–22)

## 2022-10-05 MED ORDER — SODIUM CHLORIDE 0.9 % IV SOLN
250.0000 mg | Freq: Every day | INTRAVENOUS | Status: DC
  Administered 2022-10-05: 250 mg via INTRAVENOUS
  Filled 2022-10-05 (×2): qty 20

## 2022-10-05 MED ORDER — LACTATED RINGERS IV SOLN: INTRAVENOUS | Status: DC

## 2022-10-05 MED ORDER — SODIUM CHLORIDE 0.9 % IV SOLN
10.0000 mg/kg | Freq: Once | INTRAVENOUS | Status: AC
  Administered 2022-10-06: 500 mg via INTRAVENOUS
  Filled 2022-10-05: qty 50

## 2022-10-05 MED ORDER — METHYLPREDNISOLONE SODIUM SUCC 40 MG IJ SOLR: 20.0000 mg | Freq: Two times a day (BID) | INTRAMUSCULAR | Status: DC

## 2022-10-05 MED ORDER — METHYLPREDNISOLONE SODIUM SUCC 40 MG IJ SOLR
40.0000 mg | INTRAMUSCULAR | Status: DC
  Administered 2022-10-05 – 2022-10-06 (×2): 40 mg via INTRAVENOUS
  Filled 2022-10-05 (×2): qty 1

## 2022-10-05 NOTE — Progress Notes (Signed)
[facility] PROGRESS NOTE  Karen Knox (DOB: 29-Dec-1965) ZOX:[id number] PCP: Sheliah Hatch, MD  Brief Narrative: Karen Knox is a 57 y.o. female with a history of Crohn's disease, repeated small bowel obstruction status post partial colectomy/small bowel resection in 1993 and 2002 who presented to the ED on 10/04/2022 with abdominal pain and constipation, found to have high-grade distal SBO associated with distal ileal wall thickening on CT. She was admitted for SBO with general surgery consultation, did not require NG tube. GI consulted for Crohn's flare, started on IV steroids.  Subjective: Abdominal pain significantly improved this morning after starting steroids yesterday. Tolerated some liquids, but not a lot yet. +BM. No bleeding reported. Has no swelling, dyspnea, orthopnea.   Objective: BP 120/75   Pulse 64   Temp 99 F (37.2 C) (Oral)   Resp 18   Ht 5\' 3"  (1.6 m)   LMP 02/17/2020   SpO2 96%   BMI 20.51 kg/m   Gen: No distress Pulm: Clear, nonlabored  CV: RRR, no MRG GI: Soft, minimally tender without distention or rebound, +BS Neuro: Alert and oriented. No new focal deficits. Ext: Warm, no deformities. Skin: No rashes, lesions or ulcers on visualized skin   Assessment & Plan: Crohn's disease with flare and SBO: Flare precipitated by insurance noncoverage of remicade which the patient had been taking since 2003 with good disease control.  - Continue IV steroids per GI. Transition to oral prior to improvement in inflammation risks treatment failure due to poor absorption. Planning to continue through 7/17, possible deescalation 7/18. Long term management per GI, updating hepatitis B serology. - Full liquids, potential for advancement 7/17 if tolerates. She's not taking adequate hydration at this time, still appears slightly hypovolemic by exam, will continue IVF - Check inflammatory markers in serum and stool - Continue lovenox VTE ppx and frequent OOB  given high risk. Surgery of w/pharmacologic VTE ppx.  - Avoid narcotics if able. - General surgery following, though SBO has clinically resolved.  - WBC not likely to be reliable now that steroids started.  Iron deficiency: Last ferritin was low at 5.1. Poor enteral absorption suspected. - IV iron per GI.   Tyrone Nine, MD [facility] [email or web address] 10/05/2022, 1:19 PM

## 2022-10-05 NOTE — Plan of Care (Signed)

## 2022-10-05 NOTE — Consult Note (Addendum)
Consultation  Referring Provider:   [facility] Primary Care Physician:  Sheliah Hatch, MD Primary Gastroenterologist:  Dr. Lavon Paganini       Reason for Consultation:     SBO in setting of Crohn's         HPI:   Karen Knox is a 57 y.o. female with past medical history significant for Crohn's disease, status post partial colectomy/bowel resection 93 in 2002, recurrent small bowel obstruction, IDA on IV iron infusions, Albertus, latent TB October 2016 status posttreatment, B12 deficiency, arthritis, status post cholecystectomy, appendectomy presents with abdominal pain and nausea vomiting.  04/08/2020 CTE  abdomen and pelvis showed stricture involving 8 cm segment distal ileum consistent with active Crohn's, no fistula abscess or complication, 12 mm indeterminate hypervascular mass left hepatic lobe are without and with contrast recommended 04/18/2020 Colonoscopy end-to-end ileocolonic anastomosis characterized by ulcerations, few ulcers in terminal ileum, normal mucosa throughout entire colon.  Pathology showed severely active chronic ileitis with ulceration at the TI, negative for granuloma or dysplasia no CMV  Patient had Crohn's flare 03/2020 after trying to extend remicaide from every 6 weeks to 8 week,  treated with prednisone taper followed by budesonide taper, intolerant 6-MP.  Patient last seen in the office 10/20/2021. At that time patient did have positive TB Gold, 12/14/2021 CXR unremarkable CRP 0.6, sed rate 15, fecal calprotectin never returned. Normal B12 and folate, ferritin low at 5.1, iron 45, vitamin D low at 15   CT abdomen pelvis with contrast for abdominal pain, vomiting shows high-grade obstruction distal small bowel, 5.7 cm in length distal ileum with abnormal wall thickening extending from area of ileal ileal anastomosis to iliocolic anastomosis in the right abdomen.  Suggestive of active Crohn's disease distal ileum with high-grade small bowel obstruction.   No  ascites or pneumoperitoneum  Patient has been on Remicade every 6 weeks x 2003, 01/2021 Remicade antibody undetectable drug trough 20.  Has been on 10mg /kg every 6 weeks.  Due to insurance, she was unable to get her remicaide will also get biosimilar, was appealing to get infliximab and missed a transfusion.  She was sent in entocort 3 mg daily on 06/07 and started on 06/10.  She also had prednisone, she was having break through pain/ discomfort with eating, distended, so took 2 day of 50, 2 day of 40, 2 day of 30 , 2 day of 10 started June 28th for 10 days and stopped entocort at that time.  Initiation of remicaide July 3rd.   She continued to have symptoms after her remicaide so started back on entocort last week.  She states for at least a week she states she had burping with anytime she ate, had early satiety with anything she ate and AB discomfort worse RLQ near surgical site.  She states she had poor PO intacke that week, had been drinking smoothies.  Sunday night had salmon, sweet potato and aspargus, then after that meal had severe AB pain at RLQ surgical site, more sharp and felt "hard intestines" that continued into the night.  Monday morning drank coffee with pain, took tylenol and took budesonide. She continued to try to take sips of liquids but she continued to have nausea with vomiting and presented to the ER.  She normally has BM several times a day but feels complete BM, when she ahs a blockage she has small incomplete Bm's. Last one was this AM with large volume stool without associated AB pain. Tried  ice cubes this AM without pain.  No fever, chills. Has had some minor weight loss due to decreased PO intake.  She denies SOB, CP, has had leg cramps but better with IVF.  Patient denies joint pain, rashes.   Abnormal ED labs: Abnormal Labs Reviewed  COMPREHENSIVE METABOLIC PANEL - Abnormal; Notable for the following components:      Result Value   Glucose, Bld 105 (*)    Total  Bilirubin 1.8 (*)    All other components within normal limits  CBC WITH DIFFERENTIAL/PLATELET - Abnormal; Notable for the following components:   WBC 20.0 (*)    RBC 5.39 (*)    HCT 46.1 (*)    Neutro Abs 16.2 (*)    Monocytes Absolute 1.7 (*)    Abs Immature Granulocytes 0.08 (*)    All other components within normal limits  URINALYSIS, ROUTINE W REFLEX MICROSCOPIC - Abnormal; Notable for the following components:   Ketones, ur 5 (*)    All other components within normal limits  CBC - Abnormal; Notable for the following components:   WBC 13.2 (*)    All other components within normal limits  CBG MONITORING, ED - Abnormal; Notable for the following components:   Glucose-Capillary 110 (*)    All other components within normal limits     Past Medical History:  Diagnosis Date   Anal fissure    Arthritis    ? IBD related?   B12 deficiency    Crohn's disease (HCC)    External hemorrhoids    Gilbert's syndrome    Internal hemorrhoids    Iron deficiency anemia    Small bowel stricture (HCC)    multiple 07/05/00   TB (tuberculosis)    "latent"- was treated because pt was on Remicade; October 2016    Surgical History:  She  has a past surgical history that includes Exploratory laparotomy (2002); Appendectomy; Colon resection (1993, 2002); Cholecystectomy; Colonoscopy; Upper gastrointestinal endoscopy; and Breast biopsy (Right, 06/04/2022). Family History:  Her family history includes Arthritis in her father; Breast cancer (age of onset: 52) in her mother; Colon cancer (age of onset: 52) in her father; Lung cancer in her father; Melanoma in her mother; Multiple sclerosis in her father. Social History:   reports that she has never smoked. She has never used smokeless tobacco. She reports current alcohol use. She reports that she does not use drugs.  Prior to Admission medications   Medication Sig Start Date End Date Taking? Authorizing Provider  budesonide (ENTOCORT EC) 3 MG 24 hr  capsule Take 1 capsule (3 mg total) by mouth daily. 08/27/22  Yes Tesslyn Baumert, Eleonore Chiquito, MD  Cholecalciferol (VITAMIN D) 125 MCG (5000 UT) CAPS Take 5,000 Units by mouth daily.   Yes [provider]  cyanocobalamin (VITAMIN B12) 1000 MCG/ML injection INJECT 1 ML (1,000 MCG TOTAL) INTO THE MUSCLE EVERY 30 DAYS. Patient taking differently: Inject 1,000 mcg into the muscle every 30 (thirty) days. 11/29/21  Yes Kaylise Blakeley, Eleonore Chiquito, MD  inFLIXimab (REMICADE) 100 MG injection 10mg /kg every 6 weeks Patient taking differently: Inject 100 mg into the muscle See admin instructions. 10mg /kg every 6 weeks 09/06/22  Yes Florita Nitsch, Eleonore Chiquito, MD  MAGNESIUM MALATE PO Take 4,050 mg by mouth daily. 1350 mg each capsule   Yes [provider]  potassium citrate (UROCIT-K) 5 MEQ (540 MG) SR tablet Take 5 mEq by mouth daily.   Yes [provider]  cholestyramine light (PREVALITE) 4 GM/DOSE powder Take 1 packet (  4 g total) by mouth daily. Patient not taking: Reported on 10/04/2022 01/23/21   Napoleon Form, MD  cyanocobalamin (VITAMIN B12) 1000 MCG/ML injection Inject 1 mL (1,000 mcg total) into the muscle every 30 (thirty) days. Patient not taking: Reported on 10/04/2022 11/18/21   Napoleon Form, MD  ofloxacin (FLOXIN OTIC) 0.3 % OTIC solution Place 5 drops into the right ear daily. Patient not taking: Reported on 10/04/2022 11/11/21   Jarold Motto, PA  predniSONE (DELTASONE) 10 MG tablet Take 1 tablet (10 mg total) by mouth daily with breakfast. Patient not taking: Reported on 10/04/2022 08/27/22   Napoleon Form, MD  predniSONE (DELTASONE) 5 MG tablet Take 1 tablet (5 mg total) by mouth daily with breakfast. Patient not taking: Reported on 10/04/2022 08/27/22   Napoleon Form, MD  Vitamin D, Ergocalciferol, (DRISDOL) 1.25 MG (50000 UNIT) CAPS capsule TAKE 1 CAPSULE (50,000 UNITS TOTAL) BY MOUTH EVERY 7 (SEVEN) DAYS Patient not taking: Reported on 10/04/2022 02/08/22   Sheliah Hatch, MD    Current Facility-Administered Medications  Medication Dose Route Frequency Provider Last Rate Last Admin   0.9 %  sodium chloride infusion   Intravenous Continuous Emeline General, MD 125 mL/hr at 10/05/22 0259 New Bag at 10/05/22 0259   cholestyramine light (PREVALITE) packet 4 g  4 g Oral Daily PRN Emeline General, MD       clobetasol cream (TEMOVATE) 0.05 %   Topical BID Mikey College T, MD       enoxaparin (LOVENOX) injection 40 mg  40 mg Subcutaneous Q24H Mikey College T, MD   40 mg at 10/04/22 2121   ferric gluconate (FERRLECIT) 250 mg in sodium chloride 0.9 % 250 mL IVPB  250 mg Intravenous Daily Rosser Collington V, MD       hydrALAZINE (APRESOLINE) injection 5 mg  5 mg Intravenous Q6H PRN Mikey College T, MD       HYDROmorphone (DILAUDID) injection 0.5-1 mg  0.5-1 mg Intravenous Q2H PRN Mikey College T, MD   0.5 mg at 10/04/22 1814   lidocaine (XYLOCAINE) 2 % jelly 1 Application  1 Application Topical Once Fayrene Helper, MD       methylPREDNISolone sodium succinate (SOLU-MEDROL) 40 mg/mL injection 40 mg  40 mg Intravenous Q24H Tamlyn Sides, Eleonore Chiquito, MD       ondansetron (ZOFRAN) tablet 4 mg  4 mg Oral Q6H PRN Mikey College T, MD       Or   ondansetron [facility]) injection 4 mg  4 mg Intravenous Q6H PRN Emeline General, MD   4 mg at 10/04/22 1816   Oral care mouth rinse  15 mL Mouth Rinse PRN Emeline General, MD        Allergies as of 10/04/2022   (No Known Allergies)    Review of Systems:    Constitutional: No weight loss, fever, chills, weakness or fatigue HEENT: Eyes: No change in vision               Ears, Nose, Throat:  No change in hearing or congestion Skin: No rash or itching Cardiovascular: No chest pain, chest pressure or palpitations   Respiratory: No SOB or cough Gastrointestinal: See HPI and otherwise negative Genitourinary: No dysuria or change in urinary frequency Neurological: No headache, dizziness or syncope Musculoskeletal: No new muscle or joint  pain Hematologic: No bleeding or bruising Psychiatric: No history of depression or anxiety     Physical Exam:  Vital signs in last  24 hours: Temp:  [98.3 F (36.8 C)-99 F (37.2 C)] 99 F (37.2 C) (07/16 0718) Pulse Rate:  [62-68] 64 (07/16 0724) Resp:  [16-18] 18 (07/16 0718) BP: (120-146)/(68-87) 120/75 (07/16 0724) SpO2:  [95 %-97 %] 96 % (07/16 0718) Last BM Date : 10/03/22 Last BM recorded by nurses in past 5 days No data recorded  General:   Pleasant, well developed female in no acute distress Head:  Normocephalic and atraumatic. Eyes: sclerae anicteric,conjunctive pink  Heart:  regular rate and rhythm Pulm: Clear anteriorly; no wheezing Abdomen:  Distended, but soft AB, Hypoactive bowel sounds. mild tenderness in the RLQ near surgical scar but no rebound, No organomegaly appreciated. Extremities:  Without edema. Msk:  Symmetrical without gross deformities. Peripheral pulses intact.  Neurologic:  Alert and  oriented x4;  No focal deficits.  Skin:   Dry and intact without significant lesions or rashes. Psychiatric:  Cooperative. Normal mood and affect.  LAB RESULTS: Recent Labs    10/04/22 1019 10/05/22 0037  WBC 20.0* 13.2*  HGB 14.6 12.6  HCT 46.1* 40.4  PLT 400 348   BMET Recent Labs    10/04/22 1019  NA 137  K 4.2  CL 101  CO2 26  GLUCOSE 105*  BUN 13  CREATININE 0.99  CALCIUM 9.1   LFT Recent Labs    10/04/22 1019  PROT 7.1  ALBUMIN 3.6  AST 28  ALT 19  ALKPHOS 92  BILITOT 1.8*   PT/INR No results for input(s): "LABPROT", "INR" in the last 72 hours.  STUDIES: CT ABDOMEN PELVIS W CONTRAST  Result Date: 10/04/2022 CLINICAL DATA:  Abdominal pain, vomiting, history of Crohn's disease EXAM: CT ABDOMEN AND PELVIS WITH CONTRAST TECHNIQUE: Multidetector CT imaging of the abdomen and pelvis was performed using the standard protocol following bolus administration of intravenous contrast. RADIATION DOSE REDUCTION: This exam was performed according  to the departmental dose-optimization program which includes automated exposure control, adjustment of the mA and/or kV according to patient size and/or use of iterative reconstruction technique. CONTRAST:  75mL OMNIPAQUE IOHEXOL 350 MG/ML SOLN COMPARISON:  04/08/2020 FINDINGS: Lower chest: Visualized lower lung fields are clear. Hepatobiliary: No focal abnormalities are seen in liver. There is no dilation of bile ducts. Surgical clips are seen in gallbladder fossa. Pancreas: No focal abnormalities are seen. Spleen: Unremarkable. Adrenals/Urinary Tract: Adrenals are unremarkable. There is no hydronephrosis. There are no renal or ureteral stones. Urinary bladder is not distended. Stomach/Bowel: Stomach is unremarkable. There is abnormal dilation of small bowel loops measuring up to 5.4 cm in right lower quadrant. There is fecalization in the dilated distal small bowel loops. Surgical staples are seen in distal ileum. There is narrowing and wall thickening in distal ileum close to the ileocolic anastomosis. Length of narrowed distal ileum is a proximally 5.7 cm. There is ileo ileal anastomosis in right lower quadrant. There is no significant dilation of colon. There is no pericolic stranding. Vascular/Lymphatic: Vascular structures are unremarkable. There are subcentimeter nodes in the mesentery with no change. Reproductive: Unremarkable. Other: There is no ascites or pneumoperitoneum. Musculoskeletal: No acute findings are seen. IMPRESSION: There is high-grade obstruction in distal small bowel. There is a 5.7 cm length distal ileum which is small in caliber with abnormal wall thickening extending from the area of ileo ileal anastomosis to the ileocolic anastomosis in right side of abdomen. Findings most likely suggest active Crohn's disease in distal ileum close to the ileocolic anastomosis with high-grade small bowel obstruction. There is no hydronephrosis.  There is no ascites or pneumoperitoneum. Electronically  Signed   By: Ernie Avena M.D.   On: 10/04/2022 15:16      Impression    Crohn's disease with SBO history of recurrent SBO s/p small bowel resection and colonic resections 1993 and 2002 Had normal sed rate/CRP 2023 12/14/2021 CXR negative 10/05/2022 WBC 13.2 ( WBC 20) HGB 12.6 MCV 83.6  CT abdomen pelvis high-grade SBO 5.7 cm distal ileum with abnormal wall thickening extending from ileal ileal anastomosis to ileocolonic anastomosis in the right abdomen Has been on Remicade since 2003, due to insurance change patient missed 1 dose because she was advocating for Remicade versus biosimilar Patient likely had flare due to missing Remicade injection versus failure  -Has responded well to IV methylprednisolone with decreased abdominal pain, tolerating full liquids  Iron deficiency anemia 10/20/2021 Iron 45 Ferritin 5.1 B12 649 Likely some poor p.o. absorption Benefit from IV iron  Principal Problem:   SBO (small bowel obstruction) (HCC) Active Problems:   Crohn's colitis (HCC)    LOS: 0 days     Plan   -will check antibody to make sure this is not a failure or antibody development -Check CRP, sed rate, fecal calprotectin -Continue IV methylprednisolone today and tomorrow, consider switching to prednisone p.o. Thursday with taper down to 5 mg weekly. -Continue full liquid diet today, can potentially advance diet to soft diet tomorrow -Suggest IV iron -Will repeat hepatitis B core and hepatitis B surface antigen, up-to-date on chest x-ray -Continue Lovenox 40 mg daily -Minimal narcotics, encourage patient to walk  Thank you for your kind consultation, we will continue to follow.   Doree Albee  10/05/2022, 10:13 AM   Attending physician's note  I have taken a history, reviewed the chart and examined the patient. I performed a substantive portion of this encounter, including complete performance of at least one of the key components, in conjunction with the APP. I agree  with the APP's note, impression and recommendations.   57 year old female with severe Crohn's disease s/p partial colectomy with recurrent small bowel obstruction  Due to change in insurance plan, she missed Remicade infusion for many weeks resulting in Crohn's exacerbation and received a dose on 09/22/2022  Patient got admitted with high-grade small bowel obstruction, improving on IV steroids  She will benefit with reinduction with Remicade, will check with pharmacy if she can receive 10 mg/kg tomorrow which will be 2 weeks from her last dose  Continue current dose of IV methylprednisolone  IV iron for further iron deficiency, ferritin 5.1 Continue Lovenox for DVT prophylaxis  Continue full liquid diet, will consider advancing to low residue soft diet if she continues to improve   The patient was provided an opportunity to ask questions and all were answered. The patient agreed with the plan and demonstrated an understanding of the instructions.  [name] , MD [phone]

## 2022-10-05 NOTE — Progress Notes (Signed)
   10/05/22 0945  Spiritual Encounters  Type of Visit Initial  Care provided to: Pt and family  Reason for visit Advance directives  OnCall Visit No  Advance Directives (For Healthcare)  Does Patient Have a Medical Advance Directive? No  Would patient like information on creating a medical advance directive? Yes (Inpatient - patient defers creating a medical advance directive at this time - Information given)   Responded to spiritual consult for patient requesting advance directive. Patient and spouse present. Patient indicated she requested advance directive because she thought she was going to have surgery however, since it was canceled the advance direct is no longer necessary. Explained the possibility of usage in the future, after discussion patient and spouse where happy to receive the education. Patient does not wish to complete at this time but states she will take home and complete and return. Spouse also requested a blank for to complete for self.

## 2022-10-05 NOTE — Progress Notes (Addendum)
MEDICATION RELATED CONSULT NOTE  Pharmacy Consult for Infliximab Indication: Crohn's disease  No Known Allergies  Patient Measurements: Height: 5\' 3"  (160 cm) IBW/kg (Calculated) : 52.4  Vital Signs: Temp: 98.1 F (36.7 C) (07/16 1624) Temp Source: Oral (07/16 1624) BP: 185/100 (07/16 1624) Pulse Rate: 64 (07/16 1624) Intake/Output from previous day: 07/15 0701 - 07/16 0700 In: 1002.6 [I.V.:1002.6] Out: -  Intake/Output from this shift: No intake/output data recorded.  Labs: Recent Labs    10/04/22 1019 10/05/22 0037  WBC 20.0* 13.2*  HGB 14.6 12.6  HCT 46.1* 40.4  PLT 400 348  CREATININE 0.99  --   MG 2.3  --   ALBUMIN 3.6  --   PROT 7.1  --   AST 28  --   ALT 19  --   ALKPHOS 92  --   BILITOT 1.8*  --    CrCl cannot be calculated (Unknown ideal weight.).   Microbiology: No results found for this or any previous visit (from the past 720 hour(s)).  Medical History: Past Medical History:  Diagnosis Date   Anal fissure    Arthritis    ? IBD related?   B12 deficiency    Crohn's disease (HCC)    External hemorrhoids    Gilbert's syndrome    Internal hemorrhoids    Iron deficiency anemia    Small bowel stricture (HCC)    multiple 07/05/00   TB (tuberculosis)    "latent"- was treated because pt was on Remicade; October 2016    Assessment: 57 y.o. female with past medical history significant for Crohn's disease s/p partial colectomy/bowel resection, recurrent small bowel obstruction, hx latent TB October 2016 s/p treatment w/ Dr. Ilsa Iha per chart review who presents with abdominal pain, nausea, and vomiting.   Per GI note, patient has been on Remicade since 2003, 01/2021 Remicade antibody undetectable, drug trough 20. Has been on 10mg /kg every 6 weeks. Due to insurance, she was unable to get Remicaide, transitioned to biosimilar, and recently missed a transfusion due to transition/insurance approval. She was sent in Entocort 3 mg daily on 06/07 and started  on 06/10 along with prednisone. Remicaide was reinitiated July 3rd. 7/16 hepatitis screen negative.   Previous order through [facility]: Remicade 536 mg IV on 09/22/2022. Per GI recommendations, will complete re-induction with Remicade, last dose 09/22/22, next dose 14 days 10/06/22 at 10 mg/kg.   Plan:  Remicade 500 mg IV x1 10/06/2022   Thank you for allowing pharmacy to be a part of this patient's care.  Thelma Barge, PharmD Clinical Pharmacist

## 2022-10-05 NOTE — Progress Notes (Signed)
Subjective/Chief Complaint: Pt doing well  Tol ice chips + large BM   Objective: Vital signs in last 24 hours: Temp:  [97.6 F (36.4 C)-99 F (37.2 C)] 99 F (37.2 C) (07/16 0718) Pulse Rate:  [60-68] 64 (07/16 0724) Resp:  [16-18] 18 (07/16 0718) BP: (120-150)/(68-98) 120/75 (07/16 0724) SpO2:  [95 %-100 %] 96 % (07/16 0718) Last BM Date : 10/03/22  Intake/Output from previous day: 07/15 0701 - 07/16 0700 In: 1002.6 [I.V.:1002.6] Out: -  Intake/Output this shift: No intake/output data recorded.  PE:  Constitutional: No acute distress, conversant, appears states age. Eyes: Anicteric sclerae, moist conjunctiva, no lid lag Lungs: Clear to auscultation bilaterally, normal respiratory effort CV: regular rate and rhythm, no murmurs, no peripheral edema, pedal pulses 2+ GI: Soft, no masses or hepatosplenomegaly, non-tender to palpation Skin: No rashes, palpation reveals normal turgor Psychiatric: appropriate judgment and insight, oriented to person, place, and time   Lab Results:  Recent Labs    10/04/22 1019 10/05/22 0037  WBC 20.0* 13.2*  HGB 14.6 12.6  HCT 46.1* 40.4  PLT 400 348   BMET Recent Labs    10/04/22 1019  NA 137  K 4.2  CL 101  CO2 26  GLUCOSE 105*  BUN 13  CREATININE 0.99  CALCIUM 9.1   PT/INR No results for input(s): "LABPROT", "INR" in the last 72 hours. ABG No results for input(s): "PHART", "HCO3" in the last 72 hours.  Invalid input(s): "PCO2", "PO2"  Studies/Results: CT ABDOMEN PELVIS W CONTRAST  Result Date: 10/04/2022 CLINICAL DATA:  Abdominal pain, vomiting, history of Crohn's disease EXAM: CT ABDOMEN AND PELVIS WITH CONTRAST TECHNIQUE: Multidetector CT imaging of the abdomen and pelvis was performed using the standard protocol following bolus administration of intravenous contrast. RADIATION DOSE REDUCTION: This exam was performed according to the departmental dose-optimization program which includes automated exposure  control, adjustment of the mA and/or kV according to patient size and/or use of iterative reconstruction technique. CONTRAST:  75mL OMNIPAQUE IOHEXOL 350 MG/ML SOLN COMPARISON:  04/08/2020 FINDINGS: Lower chest: Visualized lower lung fields are clear. Hepatobiliary: No focal abnormalities are seen in liver. There is no dilation of bile ducts. Surgical clips are seen in gallbladder fossa. Pancreas: No focal abnormalities are seen. Spleen: Unremarkable. Adrenals/Urinary Tract: Adrenals are unremarkable. There is no hydronephrosis. There are no renal or ureteral stones. Urinary bladder is not distended. Stomach/Bowel: Stomach is unremarkable. There is abnormal dilation of small bowel loops measuring up to 5.4 cm in right lower quadrant. There is fecalization in the dilated distal small bowel loops. Surgical staples are seen in distal ileum. There is narrowing and wall thickening in distal ileum close to the ileocolic anastomosis. Length of narrowed distal ileum is a proximally 5.7 cm. There is ileo ileal anastomosis in right lower quadrant. There is no significant dilation of colon. There is no pericolic stranding. Vascular/Lymphatic: Vascular structures are unremarkable. There are subcentimeter nodes in the mesentery with no change. Reproductive: Unremarkable. Other: There is no ascites or pneumoperitoneum. Musculoskeletal: No acute findings are seen. IMPRESSION: There is high-grade obstruction in distal small bowel. There is a 5.7 cm length distal ileum which is small in caliber with abnormal wall thickening extending from the area of ileo ileal anastomosis to the ileocolic anastomosis in right side of abdomen. Findings most likely suggest active Crohn's disease in distal ileum close to the ileocolic anastomosis with high-grade small bowel obstruction. There is no hydronephrosis. There is no ascites or pneumoperitoneum. Electronically Signed   By:  Ernie Avena M.D.   On: 10/04/2022 15:16     Anti-infectives: Anti-infectives (From admission, onward)    None       Assessment/Plan: Crohn's disease with SBO - CT with above and 5.7 cm length of distal ileum that appears thickened near prior ileocolic anastomosis - WBC down -Pt with large BM this AM - recommend GI consult - CCS will follow. No emergent role for surgery today   FEN: OK for fulls, IVF, would maximize IV meds and avoid PO meds as able VTE:SCD's, ok for lovenox ID: none currently   I reviewed ED provider notes, last 24 h vitals and pain scores, last 48 h intake and output, last 24 h labs and trends, and last 24 h imaging results.  LOS: 0 days    Axel Filler 10/05/2022

## 2022-10-06 DIAGNOSIS — K50012 Crohn's disease of small intestine with intestinal obstruction: Secondary | ICD-10-CM | POA: Diagnosis not present

## 2022-10-06 DIAGNOSIS — K56609 Unspecified intestinal obstruction, unspecified as to partial versus complete obstruction: Secondary | ICD-10-CM | POA: Diagnosis not present

## 2022-10-06 LAB — BASIC METABOLIC PANEL
Anion gap: 6 (ref 5–15)
BUN: 11 mg/dL (ref 6–20)
CO2: 23 mmol/L (ref 22–32)
Calcium: 8 mg/dL — ABNORMAL LOW (ref 8.9–10.3)
Chloride: 107 mmol/L (ref 98–111)
Creatinine, Ser: 0.62 mg/dL (ref 0.44–1.00)
GFR, Estimated: >60 mL/min (ref >60)
Glucose, Bld: 125 mg/dL — ABNORMAL HIGH (ref 70–99)
Potassium: 3.7 mmol/L (ref 3.5–5.1)
Sodium: 136 mmol/L (ref 135–145)

## 2022-10-06 MED ORDER — SODIUM CHLORIDE 0.9 % IV SOLN
250.0000 mg | Freq: Every day | INTRAVENOUS | Status: AC
  Administered 2022-10-06: 250 mg via INTRAVENOUS
  Filled 2022-10-06: qty 20

## 2022-10-06 MED ORDER — DIPHENHYDRAMINE HCL 25 MG PO CAPS
25.0000 mg | ORAL_CAPSULE | Freq: Once | ORAL | Status: AC
  Administered 2022-10-06: 25 mg via ORAL
  Filled 2022-10-06: qty 1

## 2022-10-06 MED ORDER — ACETAMINOPHEN 325 MG PO TABS
650.0000 mg | ORAL_TABLET | Freq: Four times a day (QID) | ORAL | Status: DC | PRN
  Administered 2022-10-06: 650 mg via ORAL
  Filled 2022-10-06: qty 2

## 2022-10-06 MED ORDER — ACETAMINOPHEN 325 MG PO TABS: 650.0000 mg | ORAL_TABLET | Freq: Four times a day (QID) | ORAL | Status: DC | PRN

## 2022-10-06 NOTE — Progress Notes (Signed)
Subjective/Chief Complaint: Feeling better overall, tolerating full liquids and had another bowel movement early this morning. However, she did have some nausea last night and feels more distended/tender than yesterday   Objective: Vital signs in last 24 hours: Temp:  [98.1 F (36.7 C)-99 F (37.2 C)] 98.2 F (36.8 C) (07/17 0425) Pulse Rate:  [56-64] 56 (07/17 0425) Resp:  [16-18] 16 (07/17 0425) BP: (112-185)/(72-100) 112/72 (07/17 0425) SpO2:  [94 %-100 %] 95 % (07/17 0425) Weight:  [52.2 kg] 52.2 kg (07/16 1624) Last BM Date : 10/05/22  Intake/Output from previous day: No intake/output data recorded. Intake/Output this shift: No intake/output data recorded.  PE:  Constitutional: No acute distress, conversant, appears states age. Eyes: Anicteric sclerae, moist conjunctiva, no lid lag Lungs: Clear to auscultation bilaterally, normal respiratory effort CV: regular rate and rhythm, no murmurs, no peripheral edema, pedal pulses 2+ GI: Soft, distended, TTP in RLQ which she states is worse than yesterday. Skin: No rashes, palpation reveals normal turgor Psychiatric: appropriate judgment and insight, oriented to person, place, and time   Lab Results:  Recent Labs    10/04/22 1019 10/05/22 0037  WBC 20.0* 13.2*  HGB 14.6 12.6  HCT 46.1* 40.4  PLT 400 348   BMET Recent Labs    10/04/22 1019 10/06/22 0116  NA 137 136  K 4.2 3.7  CL 101 107  CO2 26 23  GLUCOSE 105* 125*  BUN 13 11  CREATININE 0.99 0.62  CALCIUM 9.1 8.0*   PT/INR No results for input(s): "LABPROT", "INR" in the last 72 hours. ABG No results for input(s): "PHART", "HCO3" in the last 72 hours.  Invalid input(s): "PCO2", "PO2"  Studies/Results: CT ABDOMEN PELVIS W CONTRAST  Result Date: 10/04/2022 CLINICAL DATA:  Abdominal pain, vomiting, history of Crohn's disease EXAM: CT ABDOMEN AND PELVIS WITH CONTRAST TECHNIQUE: Multidetector CT imaging of the abdomen and pelvis was performed using  the standard protocol following bolus administration of intravenous contrast. RADIATION DOSE REDUCTION: This exam was performed according to the departmental dose-optimization program which includes automated exposure control, adjustment of the mA and/or kV according to patient size and/or use of iterative reconstruction technique. CONTRAST:  75mL OMNIPAQUE IOHEXOL 350 MG/ML SOLN COMPARISON:  04/08/2020 FINDINGS: Lower chest: Visualized lower lung fields are clear. Hepatobiliary: No focal abnormalities are seen in liver. There is no dilation of bile ducts. Surgical clips are seen in gallbladder fossa. Pancreas: No focal abnormalities are seen. Spleen: Unremarkable. Adrenals/Urinary Tract: Adrenals are unremarkable. There is no hydronephrosis. There are no renal or ureteral stones. Urinary bladder is not distended. Stomach/Bowel: Stomach is unremarkable. There is abnormal dilation of small bowel loops measuring up to 5.4 cm in right lower quadrant. There is fecalization in the dilated distal small bowel loops. Surgical staples are seen in distal ileum. There is narrowing and wall thickening in distal ileum close to the ileocolic anastomosis. Length of narrowed distal ileum is a proximally 5.7 cm. There is ileo ileal anastomosis in right lower quadrant. There is no significant dilation of colon. There is no pericolic stranding. Vascular/Lymphatic: Vascular structures are unremarkable. There are subcentimeter nodes in the mesentery with no change. Reproductive: Unremarkable. Other: There is no ascites or pneumoperitoneum. Musculoskeletal: No acute findings are seen. IMPRESSION: There is high-grade obstruction in distal small bowel. There is a 5.7 cm length distal ileum which is small in caliber with abnormal wall thickening extending from the area of ileo ileal anastomosis to the ileocolic anastomosis in right side of abdomen. Findings most  likely suggest active Crohn's disease in distal ileum close to the ileocolic  anastomosis with high-grade small bowel obstruction. There is no hydronephrosis. There is no ascites or pneumoperitoneum. Electronically Signed   By: Ernie Avena M.D.   On: 10/04/2022 15:16    Anti-infectives: Anti-infectives (From admission, onward)    None       Assessment/Plan: Crohn's disease with SBO - CT with above and 5.7 cm length of distal ileum that appears thickened near prior ileocolic anastomosis - GI following and pt getting steroids/reinitiation of remicade - continued bowel function but more distended/tender today - CCS will follow. No emergent role for surgery today   FEN: OK for fulls, IVF,  VTE:SCD's, ok for lovenox ID: none currently   I reviewed other provider notes, last 24 h vitals and pain scores, last 48 h intake and output, last 24 h labs and trends, and last 24 h imaging results.  LOS: 1 day    Berna Bue 10/06/2022   Mdm- mod

## 2022-10-06 NOTE — Progress Notes (Addendum)
Pt was told by Laurette Schimke that she was cleared to be discharged (also stated in notes), but there's no order in. This nurse notified Dr. Imogene Burn per above. Dr. Imogene Burn advised that "if GI consultant didn't communicate it to the attending....then that is on the GI doc and pt would have to wait until tomorrow morning." Relayed this to pt. Pt agreed to stay. Since pt cleared by Laurette Schimke, this nurse asked Dr. If LR could be d/c due to pt ambulating and drinking/eating. Dr. Rip Harbour to d/c LR. Pt asked for vitals to not be taken "in the middle of the night."  Pt ambulated off of floor with husband.

## 2022-10-06 NOTE — Progress Notes (Signed)
Pt ambulated off unit with husband ; ok per physician order.

## 2022-10-06 NOTE — Progress Notes (Signed)
[facility] ROUNDING NOTE   Subjective: Overall feeling better, she had 2 bowel movements this morning.  Tolerating full liquid diet well   Objective: Vital signs in last 24 hours: Temp:  [98.1 F (36.7 C)-98.6 F (37 C)] 98.3 F (36.8 C) (07/17 1312) Pulse Rate:  [53-64] 56 (07/17 1426) Resp:  [16-18] 18 (07/17 1312) BP: (110-185)/(72-100) 145/89 (07/17 1426) SpO2:  [94 %-100 %] 99 % (07/17 1426) Weight:  [52.2 kg] 52.2 kg (07/16 1624) Last BM Date : 10/05/22 General: NAD Abdomen: Soft, mild right lower quadrant distention with mild tenderness Ext: No edema    Intake/Output from previous day: 07/16 0701 - 07/17 0700 In: 720 [P.O.:720] Out: -  Intake/Output this shift: No intake/output data recorded.   Lab Results: Recent Labs    10/04/22 1019 10/05/22 0037  WBC 20.0* 13.2*  HGB 14.6 12.6  PLT 400 348  MCV 85.5 83.6   BMET Recent Labs    10/04/22 1019 10/06/22 0116  NA 137 136  K 4.2 3.7  CL 101 107  CO2 26 23  GLUCOSE 105* 125*  BUN 13 11  CREATININE 0.99 0.62  CALCIUM 9.1 8.0*   LFT Recent Labs    10/04/22 1019  PROT 7.1  ALBUMIN 3.6  AST 28  ALT 19  ALKPHOS 92  BILITOT 1.8*   PT/INR No results for input(s): "INR" in the last 72 hours.    Imaging/Other results: No results found.    Assessment &Plan  56 yr with longstanding history of Crohn's disease s/p partial colectomy with ileocolonic anastomosis, small bowel obstruction in distal ileum near the anastomosis Crohn's exacerbation due to change in insurance and missed 2 doses of Remicade  Plan to do additional dose of Remicade for induction to improve drug level She will undergo next infusion of Remicade in 4 weeks as scheduled   Ok to discharge home later today  Switch to oral prednisone on discharge 40 mg daily for 5 days followed by taper dose decrease by 5 mg every week  Will arrange for follow-up labs and GI office follow-up  Advance diet to soft low residue  as tolerated  K. Scherry Ran , MD [phone]  [facility]

## 2022-10-06 NOTE — TOC CM/SW Note (Signed)
Transition of Care [facility]) - Inpatient Brief Assessment   Patient Details  Name: Karen Knox MRN: [id number] Date of Birth: 05-May-1965  [facility]) CM/SW Contact:    Harriet Masson, RN Phone Number: 10/06/2022, 12:02 PM   Clinical Narrative: Crohn's disease with flare and SBO.  Patient receiving remicade.  TOC following.    Transition of Care Asessment: Insurance and Status: Insurance coverage has been reviewed Patient has primary care physician: Yes Home environment has been reviewed: safe to discharge home when medically stable Prior level of function:: independent Prior/Current Home Services: No current home services Social Determinants of Health Reivew: SDOH reviewed no interventions necessary Readmission risk has been reviewed: Yes Transition of care needs: no transition of care needs at this time

## 2022-10-06 NOTE — Progress Notes (Signed)
[facility] PROGRESS NOTE  Karen Knox (DOB: 02-05-1966) ZOX:[id number] PCP: Sheliah Hatch, MD  Brief Narrative: Karen Knox is a 57 y.o. female with a history of Crohn's disease, repeated small bowel obstruction status post partial colectomy/small bowel resection in 1993 and 2002 who presented to the ED on 10/04/2022 with abdominal pain and constipation, found to have high-grade distal SBO associated with distal ileal wall thickening on CT. She was admitted for SBO with general surgery consultation, did not require NG tube. GI consulted for Crohn's flare, started on IV steroids.  Subjective: Taken her diet back to water due to recurrence of abdominal pain and distention. Had BM last night. Wants to ambulate more, but restricted.  Objective: BP (!) 145/89   Pulse (!) 56   Temp 98.3 F (36.8 C) (Oral)   Resp 18   Ht 5\' 3"  (1.6 m)   Wt 52.2 kg   LMP 02/17/2020   SpO2 99%   BMI 20.37 kg/m   Gen: No distress Pulm: Clear, nonlabored  CV: RRR, no MRG or edema GI: Soft, tender in RLQ predominantly with some distention, +BS Neuro: Alert and oriented. No new focal deficits. Ext: Warm, no deformities. Skin: No rashes, lesions or ulcers on visualized skin   Assessment & Plan: Crohn's disease with flare and SBO: Flare precipitated by insurance noncoverage of remicade which the patient had been taking since 2003 with good disease control. Inflammatory markers reassuring currently. - Continue IV steroids per GI. Transitioning to oral prior to improvement in inflammation risks treatment failure due to poor absorption. Planning to continue through 7/17, possible deescalation 7/18. Long term management per GI, updating hepatitis B serology. May be able to (re)initiate remicade here. - Increasing distention but still a reasonable exam. Surgery advanced diet to soft. Continue IVF since she's having a possible setback in po intake.  - Continue lovenox VTE ppx and frequent OOB,  can leave unit. - Avoid narcotics if able.  Iron deficiency: Last ferritin was low at 5.1. Poor enteral absorption suspected. - IV iron per GI.   AKI: Improved with IVF.  - Avoid nephrotoxins.  Karen Nine, MD [facility] [email or web address] 10/06/2022, 2:48 PM

## 2022-10-07 ENCOUNTER — Other Ambulatory Visit: Payer: Self-pay

## 2022-10-07 DIAGNOSIS — K56609 Unspecified intestinal obstruction, unspecified as to partial versus complete obstruction: Secondary | ICD-10-CM | POA: Diagnosis not present

## 2022-10-07 MED ORDER — PREDNISONE 5 MG PO TABS: ORAL_TABLET | ORAL | 0 refills | Status: DC

## 2022-10-07 NOTE — Discharge Summary (Signed)
Physician Discharge Summary   Patient: Karen Knox MRN: [id number] DOB: Oct 11, 1965  Admit date:     10/04/2022  Discharge date: 10/07/22  Discharge Physician: Karen Knox   PCP: Karen Hatch, MD   Recommendations at discharge:  Continue prednisone taper and Knox follow up for remicade infusions for Crohn's disease. Admitted for SBO due to flare which resolved without surgical intervention.   Discharge Diagnoses: Principal Problem:   SBO (small bowel obstruction) (HCC) Active Problems:   Crohn's colitis (HCC)   Crohn's disease with intestinal obstruction [facility])  Hospital Course: Karen Knox is a 57 y.o. female with a history of Crohn's disease, repeated small bowel obstruction status post partial colectomy/small bowel resection in 1993 and 2002 who presented to the ED on 10/04/2022 with abdominal pain and constipation, found to have high-grade distal SBO associated with distal ileal wall thickening on CT. She was admitted for SBO with general surgery consultation, did not require NG tube. Knox consulted for Crohn's flare, started on IV steroids. Pt improved with this, also received remicade load. Cleared for discharge by Knox, surgical, and medical teams.   Assessment and Plan: Crohn's disease with flare and SBO: Flare precipitated by insurance noncoverage of remicade which the patient had been taking since 2003 with good disease control. Inflammatory markers reassuring currently. - Improved on IV steroids, transition to po today per Knox, taper of prednisone prescribed per their recommendations.  - Will follow up with Karen Knox for ongoing care. Continue IV steroids per Knox.  Received remicade load. She will undergo next infusion of Remicade in 4 weeks as scheduled     Iron deficiency: Last ferritin was low at 5.1. Poor enteral absorption suspected. - IV iron per Knox.    AKI: Improved with IVF.  - Avoid nephrotoxins.  Consultants: Knox, surgery Procedures performed: none   Disposition: Home Diet recommendation: Lactose free, low residue DISCHARGE MEDICATION: Allergies as of 10/07/2022   No Known Allergies      Medication List     STOP taking these medications    budesonide 3 MG 24 hr capsule Commonly known as: ENTOCORT EC   ofloxacin 0.3 % OTIC solution Commonly known as: Floxin Otic   Vitamin D (Ergocalciferol) 1.25 MG (50000 UNIT) Caps capsule Commonly known as: DRISDOL       TAKE these medications    cholestyramine light 4 GM/DOSE powder Commonly known as: Prevalite Take 1 packet (4 g total) by mouth daily.   cyanocobalamin 1000 MCG/ML injection Commonly known as: VITAMIN B12 INJECT 1 ML (1,000 MCG TOTAL) INTO THE MUSCLE EVERY 30 DAYS. What changed:  See the new instructions. Another medication with the same name was removed. Continue taking this medication, and follow the directions you see here.   inFLIXimab 100 MG injection Commonly known as: Remicade 10mg /kg every 6 weeks What changed:  how much to take how to take this when to take this   MAGNESIUM MALATE PO Take 4,050 mg by mouth daily. 1350 mg each capsule   potassium citrate 5 MEQ (540 MG) SR tablet Commonly known as: UROCIT-K Take 5 mEq by mouth daily.   predniSONE 5 MG tablet Commonly known as: DELTASONE Take 8 tablets (40 mg total) by mouth daily with breakfast for 5 days, THEN 7 tablets (35 mg total) daily with breakfast for 7 days, THEN 6 tablets (30 mg total) daily with breakfast for 7 days, THEN 5 tablets (25 mg total) daily with breakfast for 7 days, THEN 4 tablets (20  mg total) daily with breakfast for 7 days, THEN 3 tablets (15 mg total) daily with breakfast for 7 days, THEN 2 tablets (10 mg total) daily with breakfast for 7 days, THEN 1 tablet (5 mg total) daily with breakfast for 7 days. Start taking on: October 07, 2022 What changed:  medication strength See the new instructions. Another medication with the same name was removed. Continue taking this  medication, and follow the directions you see here.   Vitamin D 125 MCG (5000 UT) Caps Take 5,000 Units by mouth daily.        Follow-up Information     Karen Hatch, MD Follow up.   Specialty: Family Medicine Contact information: [address] [place] [address] [phone]         Karen Form, MD. Schedule an appointment as soon as possible for a visit.   Specialty: Gastroenterology Contact information: [address] [place] [address] [phone]                Discharge Exam: Karen Knox Weights   10/05/22 1624  Weight: 52.2 kg  BP (!) 143/86 (BP Location: Right Arm)   Pulse (!) 51   Temp (!) 97.5 F (36.4 C) (Oral)   Resp 18   Ht 5\' 3"  (1.6 m)   Wt 52.2 kg   LMP 02/17/2020   SpO2 99%   BMI 20.37 kg/m   No distress, well-appearing Nonlabored Soft, mildly tender RLQ without rebound, improved. ++BS  Condition at discharge: stable  The results of significant diagnostics from this hospitalization (including imaging, microbiology, ancillary and laboratory) are listed below for reference.   Imaging Studies: CT ABDOMEN PELVIS W CONTRAST  Result Date: 10/04/2022 CLINICAL DATA:  Abdominal pain, vomiting, history of Crohn's disease EXAM: CT ABDOMEN AND PELVIS WITH CONTRAST TECHNIQUE: Multidetector CT imaging of the abdomen and pelvis was performed using the standard protocol following bolus administration of intravenous contrast. RADIATION DOSE REDUCTION: This exam was performed according to the departmental dose-optimization program which includes automated exposure control, adjustment of the mA and/or kV according to patient size and/or use of iterative reconstruction technique. CONTRAST:  75mL OMNIPAQUE IOHEXOL 350 MG/ML SOLN COMPARISON:  04/08/2020 FINDINGS: Lower chest: Visualized lower lung fields are clear. Hepatobiliary: No focal abnormalities are seen in liver. There is no dilation of bile ducts. Surgical clips are seen in  gallbladder fossa. Pancreas: No focal abnormalities are seen. Spleen: Unremarkable. Adrenals/Urinary Tract: Adrenals are unremarkable. There is no hydronephrosis. There are no renal or ureteral stones. Urinary bladder is not distended. Stomach/Bowel: Stomach is unremarkable. There is abnormal dilation of small bowel loops measuring up to 5.4 cm in right lower quadrant. There is fecalization in the dilated distal small bowel loops. Surgical staples are seen in distal ileum. There is narrowing and wall thickening in distal ileum close to the ileocolic anastomosis. Length of narrowed distal ileum is a proximally 5.7 cm. There is ileo ileal anastomosis in right lower quadrant. There is no significant dilation of colon. There is no pericolic stranding. Vascular/Lymphatic: Vascular structures are unremarkable. There are subcentimeter nodes in the mesentery with no change. Reproductive: Unremarkable. Other: There is no ascites or pneumoperitoneum. Musculoskeletal: No acute findings are seen. IMPRESSION: There is high-grade obstruction in distal small bowel. There is a 5.7 cm length distal ileum which is small in caliber with abnormal wall thickening extending from the area of ileo ileal anastomosis to the ileocolic anastomosis in right side of abdomen. Findings most likely suggest  active Crohn's disease in distal ileum close to the ileocolic anastomosis with high-grade small bowel obstruction. There is no hydronephrosis. There is no ascites or pneumoperitoneum. Electronically Signed   By: Ernie Avena M.D.   On: 10/04/2022 15:16    Microbiology: Results for orders placed or performed in visit on 05/28/19  Novel Coronavirus, NAA ([facility])     Status: None   Collection Time: 05/28/19  1:04 PM   Specimen: Nasopharyngeal(NP) swabs in vial transport medium   NASOPHARYNGE  TESTING  Result Value Ref Range Status   SARS-CoV-2, NAA Not Detected Not Detected Final    Comment: This nucleic acid amplification test was  developed and its performance characteristics determined by [employer]. Nucleic acid amplification tests include RT-PCR and TMA. This test has not been FDA cleared or approved. This test has been authorized by FDA under an Emergency Use Authorization (EUA). This test is only authorized for the duration of time the declaration that circumstances exist justifying the authorization of the emergency use of in vitro diagnostic tests for detection of SARS-CoV-2 virus and/or diagnosis of COVID-19 infection under section 564(b)(1) of the Act, 21 U.S.C. 956OZH-0(Q) (1), unless the authorization is terminated or revoked sooner. When diagnostic testing is negative, the possibility of a false negative result should be considered in the context of a patient's recent exposures and the presence of clinical signs and symptoms consistent with COVID-19. An individual without symptoms of COVID-19 and who is not shedding SARS-CoV-2 virus wo uld expect to have a negative (not detected) result in this assay.     Labs: CBC: Recent Labs  Lab 10/04/22 1019 10/05/22 0037  WBC 20.0* 13.2*  NEUTROABS 16.2*  --   HGB 14.6 12.6  HCT 46.1* 40.4  MCV 85.5 83.6  PLT 400 348   Basic Metabolic Panel: Recent Labs  Lab 10/04/22 1019 10/06/22 0116  NA 137 136  K 4.2 3.7  CL 101 107  CO2 26 23  GLUCOSE 105* 125*  BUN 13 11  CREATININE 0.99 0.62  CALCIUM 9.1 8.0*  MG 2.3  --    Liver Function Tests: Recent Labs  Lab 10/04/22 1019  AST 28  ALT 19  ALKPHOS 92  BILITOT 1.8*  PROT 7.1  ALBUMIN 3.6   CBG: Recent Labs  Lab 10/04/22 1117  GLUCAP 110*    Discharge time spent: greater than 30 minutes.  Signed: Tyrone Nine, MD [facility] 10/07/2022

## 2022-10-07 NOTE — Progress Notes (Signed)
   Subjective/Chief Complaint: Doing well. Tolerating soft diet without n/v. Still some pain in RLQ after po intake but much improved since admission. Passing flatus. BM yesterday and this am. Received Remicade while here.   Objective: Vital signs in last 24 hours: Temp:  [97.5 F (36.4 C)-98.6 F (37 C)] 97.5 F (36.4 C) (07/18 0739) Pulse Rate:  [51-63] 51 (07/18 0739) Resp:  [18] 18 (07/18 0739) BP: (127-156)/(70-90) 143/86 (07/18 0739) SpO2:  [96 %-100 %] 99 % (07/18 0739) Last BM Date : 10/06/22  Intake/Output from previous day: 07/17 0701 - 07/18 0700 In: 1080 [P.O.:1080] Out: -  Intake/Output this shift: No intake/output data recorded.  PE: Gen: Awake and alert, nad Abd: Soft, mild lower abdominal distension, mild RLQ ttp without rigidity or guarding. +BS  Lab Results:  Recent Labs    10/04/22 1019 10/05/22 0037  WBC 20.0* 13.2*  HGB 14.6 12.6  HCT 46.1* 40.4  PLT 400 348   BMET Recent Labs    10/04/22 1019 10/06/22 0116  NA 137 136  K 4.2 3.7  CL 101 107  CO2 26 23  GLUCOSE 105* 125*  BUN 13 11  CREATININE 0.99 0.62  CALCIUM 9.1 8.0*   PT/INR No results for input(s): "LABPROT", "INR" in the last 72 hours. ABG No results for input(s): "PHART", "HCO3" in the last 72 hours.  Invalid input(s): "PCO2", "PO2"  Studies/Results: No results found.  Anti-infectives: Anti-infectives (From admission, onward)    None       Assessment/Plan: Crohn's disease with SBO - CT with above and 5.7 cm length of distal ileum that appears thickened near prior ileocolic anastomosis - GI following. Patient on steroids and has been restarted on remicade - No indication for emergency surgery - Agree with GI recs that patient is okay for d/c with f/u in GI office.     I reviewed other provider notes, last 24 h vitals and pain scores, last 48 h intake and output, last 24 h labs and trends, and last 24 h imaging results.   LOS: 2 days    Elmer Sow  [facility] 10/07/2022   Mdm- mod

## 2022-10-10 LAB — CALPROTECTIN, FECAL: Calprotectin, Fecal: 146 ug/g — ABNORMAL HIGH (ref 0–120)

## 2022-11-05 ENCOUNTER — Other Ambulatory Visit: Payer: Self-pay

## 2022-11-05 DIAGNOSIS — K50812 Crohn's disease of both small and large intestine with intestinal obstruction: Secondary | ICD-10-CM

## 2022-11-05 DIAGNOSIS — R1031 Right lower quadrant pain: Secondary | ICD-10-CM

## 2022-11-08 ENCOUNTER — Ambulatory Visit
Admission: RE | Admit: 2022-11-08 | Discharge: 2022-11-08 | Disposition: A | Discharge status: Home / Self Care | Payer: No Typology Code available for payment source | Source: Ambulatory Visit | Attending: Physician Assistant | Admitting: Physician Assistant

## 2022-11-08 ENCOUNTER — Other Ambulatory Visit (INDEPENDENT_AMBULATORY_CARE_PROVIDER_SITE_OTHER): Payer: No Typology Code available for payment source

## 2022-11-08 DIAGNOSIS — K50812 Crohn's disease of both small and large intestine with intestinal obstruction: Secondary | ICD-10-CM | POA: Diagnosis not present

## 2022-11-08 DIAGNOSIS — R1031 Right lower quadrant pain: Secondary | ICD-10-CM | POA: Diagnosis not present

## 2022-11-08 LAB — CBC WITH DIFFERENTIAL/PLATELET
Basophils Absolute: 0.1 10*3/uL (ref 0.0–0.1)
Basophils Relative: 0.4 % (ref 0.0–3.0)
Eosinophils Absolute: 0.1 10*3/uL (ref 0.0–0.7)
Eosinophils Relative: 0.5 % (ref 0.0–5.0)
HCT: 44.1 % (ref 36.0–46.0)
Hemoglobin: 13.9 g/dL (ref 12.0–15.0)
Lymphocytes Relative: 39.1 % (ref 12.0–46.0)
Lymphs Abs: 5.2 10*3/uL — ABNORMAL HIGH (ref 0.7–4.0)
MCHC: 31.6 g/dL (ref 30.0–36.0)
MCV: 86 fl (ref 78.0–100.0)
Monocytes Absolute: 1.2 10*3/uL — ABNORMAL HIGH (ref 0.1–1.0)
Monocytes Relative: 9.3 % (ref 3.0–12.0)
Neutro Abs: 6.7 10*3/uL (ref 1.4–7.7)
Neutrophils Relative %: 50.7 % (ref 43.0–77.0)
Platelets: 405 10*3/uL — ABNORMAL HIGH (ref 150.0–400.0)
RBC: 5.13 Mil/uL — ABNORMAL HIGH (ref 3.87–5.11)
RDW: 18 % — ABNORMAL HIGH (ref 11.5–15.5)
WBC: 13.3 10*3/uL — ABNORMAL HIGH (ref 4.0–10.5)

## 2022-11-08 LAB — COMPREHENSIVE METABOLIC PANEL
ALT: 13 U/L (ref 0–35)
AST: 11 U/L (ref 0–37)
Albumin: 3.9 g/dL (ref 3.5–5.2)
Alkaline Phosphatase: 72 U/L (ref 39–117)
BUN: 14 mg/dL (ref 6–23)
CO2: 31 mEq/L (ref 19–32)
Calcium: 9.3 mg/dL (ref 8.4–10.5)
Chloride: 100 mEq/L (ref 96–112)
Creatinine, Ser: 0.76 mg/dL (ref 0.40–1.20)
GFR: 88.55 mL/min (ref >60.00)
Glucose, Bld: 91 mg/dL (ref 70–99)
Potassium: 3.7 mEq/L (ref 3.5–5.1)
Sodium: 139 mEq/L (ref 135–145)
Total Bilirubin: 1.3 mg/dL — ABNORMAL HIGH (ref 0.2–1.2)
Total Protein: 6.8 g/dL (ref 6.0–8.3)

## 2022-11-08 LAB — C-REACTIVE PROTEIN: CRP: <1 mg/dL (ref 0.5–20.0)

## 2022-11-08 LAB — SEDIMENTATION RATE: Sed Rate: 16 mm/hr (ref 0–30)

## 2022-11-18 ENCOUNTER — Other Ambulatory Visit: Payer: Self-pay

## 2022-11-19 ENCOUNTER — Ambulatory Visit: Payer: No Typology Code available for payment source | Admitting: Physician Assistant

## 2022-11-23 MED ORDER — PREDNISONE 5 MG PO TABS: ORAL_TABLET | ORAL | 0 refills | Status: DC

## 2022-11-24 MED ORDER — ONDANSETRON 8 MG PO TBDP: 8.0000 mg | ORAL_TABLET | Freq: Three times a day (TID) | ORAL | 1 refills | Status: AC | PRN

## 2022-11-24 NOTE — Addendum Note (Signed)
Addended by: Quentin Mulling on: 11/24/2022 10:13 AM   Modules accepted: Orders

## 2022-12-12 ENCOUNTER — Other Ambulatory Visit: Payer: Self-pay | Admitting: Gastroenterology

## 2022-12-22 DIAGNOSIS — R7612 Nonspecific reaction to cell mediated immunity measurement of gamma interferon antigen response without active tuberculosis: Secondary | ICD-10-CM | POA: Insufficient documentation

## 2022-12-30 NOTE — Progress Notes (Signed)
12/31/2022 Karen Knox [id number] [birth year]  Referring provider: Sheliah Hatch, MD Primary GI doctor: Dr. Lavon Paganini  ASSESSMENT AND PLAN:   Crohn's disease of both small and large intestine with intestinal obstruction (HCC) s/p partial colectomy with ileocolonic anastomosis, small bowel obstruction in distal ileum near the anastomosis  Recent exacerbation due to insurance and missing 2 doses.  Has tapered off prednisone 5 mg Oct 4th for birthday remicaide 10mg /kg q 6 weeks, last dose 09/25 Doing well on symptoms, soft AB today, no discomfort, no distention, no nausea, vomiting.  Will get antibody and drug level before next infusion Will get labs with hsCRP, Sed rate, and FC  Consider cross sectional imaging if still with inflammation Follow up with Dr. Lavon Paganini 4 months  Abdominal gas/burping, no obstructive symptoms, benign AB today Patient with previous small bowel surgery, has increase in gas, will do trial of xifaxin as patient is at increased risk for SIBO Possible from increase GERD with prednisone use, has stopped prednisone see if this improves, can do pepcid as needed, information given  Irritable bowel syndrome with diarrhea -     rifaximin (XIFAXAN) 550 MG TABS tablet; Take 1 tablet (550 mg total) by mouth 3 (three) times daily for 14 days.  Iron deficiency anemia/b12 def Check CBC, continue on iron and B12  Vitamin D deficiency Start back on vitamin D, check next OV   Patient Care Team: Sheliah Hatch, MD as PCP - General (Family Medicine) Donnetta Hail, MD as Consulting Physician (Rheumatology) Mitchel Honour, DO as Consulting Physician (Obstetrics and Gynecology) Napoleon Form, MD as Consulting Physician (Gastroenterology)  HISTORY OF PRESENT ILLNESS: 57 y.o. female presents for evaluation of Crohn's ileitis. Last seen in the office on 10/20/2021.   IBD history  Diagnosed 1992 after exploratory lap. Remicade every 6 weeks x  2003 04/10/2020 Crohn's disease flare treated with prednisone and budesonide taper 01/2021  Remicade antibody level undetectable and trough 20 (Remicade infusion every 6 weeks) June 2024 hospitalized patient was started on biosimilar instead of Remicade missed a transfusion with send and Entocort 3 mg daily 6/7 and prednisone restarted on Remicade 0 7/03. 10/05/2022 Remicade drug level 54 no antibody  Last colonoscopy: 04/18/2020 colonoscopy, medication patient was on remicaide, treated with budeosinide for 30 days - Patent end-to-side ileo-colonic anastomosis, characterized by ulceration. - A few ulcers in the terminal ileum. Biopsied. - Normal mucosa in the entire examined colon. - The examination was otherwise normal.   Surgical [P], small bowel, terminal ileum ulcers - SEVERELY ACTIVE CHRONIC ILEITIS WITH ULCERATION, CONSISTENT WITH PATIENT'S CLINICAL HISTORY OF CROHN'S DISEASE - NEGATIVE FOR GRANULOMAS OR DYSPLASIA - VIRAL CYTOPATHIC CHANGES ARE NOT SEENSurgical [P], small bowel, terminal ileum ulcers - SEVERELY ACTIVE CHRONIC ILEITIS WITH ULCERATION, CONSISTENT WITH PATIENT'S CLINICAL HISTORY OF CROHN'S DISEASE - NEGATIVE FOR GRANULOMAS OR DYSPLASIA - VIRAL CYTOPATHIC CHANGES ARE NOT SEEN  Last small bowel imaging:   10/04/2022 CT AB and pelvis  with contrast for abdominal pain vomiting history of Crohn's disease shows high-grade obstruction distal small bowel, 5.7 cm length distal ileum small in caliber abnormal wall thickening extending from area of ileal ileal anastomosis to the ileocolonic anastomosis in the right side of the abdomen consistent with active Crohn's disease in distal ileum close to the ileocolonic anastomosis with high-grade small bowel obstruction Extraintestinal manifestations: The patient has not had any extraintestinal symptoms Surgical history:  1993 and 2002 terminal ileal resection with ileocolonic anastomosis.  Other significant medical history:  Latent TB  treated Oct 2016 IDA IV iron infusions-still low recheck this visit Vitamin D deficiency on 50,000 units, not taking, check next OV B 12 deficiency on injections History of anal fissure History of arthritis unknown if IBD related Gilbert syndrome  Current History She is back on the remicaide, every 6 weeks at 10mg /kg, has not missed any doses, last dose 09/25 at [facility]  She tapered off her prednisone 10/04 without any further symptoms.  She states her symptoms have resolved but she has burping for the past year since Jan and being on prednisone, very rare GERD. No nausea, vomiting.  She has AB bloating, discomfort.  She has diarrhea, loose stools since having her surgery, 1-2 x a day. No weight loss, no fever, chills, no hematocheiza.  10/05/2022 Remicade drug level 54 no antibody  Recent labs: 11/08/2022 CRP <1.0 11/08/2022 SED RATE 16 10/05/2022 Fecal calprotectin 146 11/08/2022 WBC 13.3 HGB 13.9 MCV 86.0 Platelets 405.0 10/20/2021 Iron 45 Ferritin 5.1  B12 649 FOLATE 15.4 11/08/2022 AST 11 ALT 13 Alkphos 72 TBili 1.3 12/03/2021 VITAMIN D 28.36  TB GOLD 10/20/2021 POSITIVE or TB skin if indeterminate.  Chest x-ray unremarkable HepBsAG 10/05/2022 NON REACTIVE   IBD Health Care Maintenance: Immunization History  Administered Date(s) Administered   DTaP 02/10/2011   Influenza,inj,Quad PF,6+ Mos 11/22/2018, 11/28/2019, 03/31/2021   PFIZER(Purple Top)SARS-COV-2 Vaccination 06/09/2019, 07/01/2019   PPD Test 12/14/2010, 12/22/2012, 12/25/2013   Pneumococcal Polysaccharide-23 02/11/2012, 02/14/2018   Tdap 10/28/2016   Zoster Recombinant(Shingrix) 12/03/2021, 02/19/2022    She  reports that she has never smoked. She has never used smokeless tobacco. She reports current alcohol use. She reports that she does not use drugs.  RELEVANT LABS AND IMAGING:   CBC    Component Value Date/Time   WBC 13.3 (H) 11/08/2022 0804   RBC 5.13 (H) 11/08/2022 0804   HGB 13.9 11/08/2022  0804   HCT 44.1 11/08/2022 0804   PLT 405.0 (H) 11/08/2022 0804   MCV 86.0 11/08/2022 0804   MCH 26.1 10/05/2022 0037   MCHC 31.6 11/08/2022 0804   RDW 18.0 (H) 11/08/2022 0804   LYMPHSABS 5.2 (H) 11/08/2022 0804   MONOABS 1.2 (H) 11/08/2022 0804   EOSABS 0.1 11/08/2022 0804   BASOSABS 0.1 11/08/2022 0804   Recent Labs    10/04/22 1019 10/05/22 0037 11/08/22 0804  HGB 14.6 12.6 13.9    CMP     Component Value Date/Time   NA 139 11/08/2022 0804   NA 142 02/02/2018 0000   K 3.7 11/08/2022 0804   CL 100 11/08/2022 0804   CO2 31 11/08/2022 0804   GLUCOSE 91 11/08/2022 0804   BUN 14 11/08/2022 0804   BUN 11 02/02/2018 0000   CREATININE 0.76 11/08/2022 0804   CREATININE 0.60 10/22/2011 1737   CALCIUM 9.3 11/08/2022 0804   PROT 6.8 11/08/2022 0804   ALBUMIN 3.9 11/08/2022 0804   AST 11 11/08/2022 0804   ALT 13 11/08/2022 0804   ALKPHOS 72 11/08/2022 0804   BILITOT 1.3 (H) 11/08/2022 0804   GFRNONAA >60 10/06/2022 0116   GFRAA  05/14/2009 1342    >60        The eGFR has been calculated using the MDRD equation. This calculation has not been validated in all clinical situations. eGFR's persistently <60 mL/min signify possible Chronic Kidney Disease.      Latest Ref Rng & Units 11/08/2022    8:04 AM 10/04/2022   10:19 AM 12/03/2021    8:10 AM  Hepatic Function  Total Protein 6.0 - 8.3 g/dL 6.8  7.1  6.9   Albumin 3.5 - 5.2 g/dL 3.9  3.6  3.8   AST 0 - 37 U/L 11  28  18    ALT 0 - 35 U/L 13  19  15    Alk Phosphatase 39 - 117 U/L 72  92  80   Total Bilirubin 0.2 - 1.2 mg/dL 1.3  1.8  1.5   Bilirubin, Direct 0.0 - 0.3 mg/dL   0.2       Current Medications:    Current Outpatient Medications (Cardiovascular):    cholestyramine light (PREVALITE) 4 GM/DOSE powder, Take 1 packet (4 g total) by mouth daily.    Current Outpatient Medications (Hematological):    cyanocobalamin (VITAMIN B12) 1000 MCG/ML injection, Inject 1 mL (1,000 mcg total) into the muscle every  30 (thirty) days.  Current Outpatient Medications (Other):    inFLIXimab (REMICADE) 100 MG injection, 10mg /kg every 6 weeks (Patient taking differently: Inject 100 mg into the muscle See admin instructions. 10mg /kg every 6 weeks)   MAGNESIUM MALATE PO, Take 4,050 mg by mouth daily. 1350 mg each capsule   ondansetron (ZOFRAN-ODT) 8 MG disintegrating tablet, Take 1 tablet (8 mg total) by mouth every 8 (eight) hours as needed.   rifaximin (XIFAXAN) 550 MG TABS tablet, Take 1 tablet (550 mg total) by mouth 3 (three) times daily for 14 days.   Cholecalciferol (VITAMIN D) 125 MCG (5000 UT) CAPS, Take 5,000 Units by mouth daily. (Patient not taking: Reported on 12/31/2022)   potassium citrate (UROCIT-K) 5 MEQ (540 MG) SR tablet, Take 5 mEq by mouth daily. (Patient not taking: Reported on 12/31/2022)  Medical History:  Past Medical History:  Diagnosis Date   Anal fissure    Arthritis    ? IBD related?   B12 deficiency    Crohn's disease (HCC)    External hemorrhoids    Gilbert's syndrome    Internal hemorrhoids    Iron deficiency anemia    Small bowel stricture (HCC)    multiple 07/05/00   TB (tuberculosis)    "latent"- was treated because pt was on Remicade; October 2016   Allergies: No Known Allergies   Surgical History:  She  has a past surgical history that includes Exploratory laparotomy (2002); Appendectomy; Colon resection (1993, 2002); Cholecystectomy; Colonoscopy; Upper gastrointestinal endoscopy; and Breast biopsy (Right, 06/04/2022). Family History:  Her family history includes Arthritis in her father; Breast cancer (age of onset: 52) in her mother; Colon cancer (age of onset: 73) in her father; Lung cancer in her father; Melanoma in her mother; Multiple sclerosis in her father.  REVIEW OF SYSTEMS  : All other systems reviewed and negative except where noted in the History of Present Illness.  PHYSICAL EXAM: BP 118/80 (BP Location: Left Arm, Patient Position: Sitting, Cuff Size:  Normal)   Pulse 70   Ht 5\' 3"  (1.6 m)   Wt 122 lb 6 oz (55.5 kg)   LMP 02/17/2020   BMI 21.68 kg/m  General Appearance: Well nourished, in no apparent distress. Head:   Normocephalic and atraumatic. Eyes:  sclerae anicteric,conjunctive pink  Respiratory: Respiratory effort normal, BS equal bilaterally without rales, rhonchi, wheezing. Cardio: RRR with no MRGs. Peripheral pulses intact.  Abdomen: Soft,  Non-distended ,active bowel sounds. No tenderness . Without guarding and Without rebound. No masses. Rectal: Not evaluated Musculoskeletal: Full ROM, Normal gait. Without edema. Skin:  Dry and intact without significant lesions or rashes Neuro: Alert and  oriented x4;  No focal deficits. Psych:  Cooperative. Normal mood and affect.    Doree Albee, PA-C 2:16 PM

## 2022-12-31 ENCOUNTER — Encounter: Payer: Self-pay | Admitting: Physician Assistant

## 2022-12-31 ENCOUNTER — Ambulatory Visit: Payer: No Typology Code available for payment source | Admitting: Physician Assistant

## 2022-12-31 VITALS — BP 118/80 | HR 70 | Ht 63.0 in | Wt 122.4 lb

## 2022-12-31 DIAGNOSIS — E538 Deficiency of other specified B group vitamins: Secondary | ICD-10-CM

## 2022-12-31 DIAGNOSIS — R143 Flatulence: Secondary | ICD-10-CM

## 2022-12-31 DIAGNOSIS — D509 Iron deficiency anemia, unspecified: Secondary | ICD-10-CM

## 2022-12-31 DIAGNOSIS — D508 Other iron deficiency anemias: Secondary | ICD-10-CM

## 2022-12-31 DIAGNOSIS — E559 Vitamin D deficiency, unspecified: Secondary | ICD-10-CM | POA: Diagnosis not present

## 2022-12-31 DIAGNOSIS — R1031 Right lower quadrant pain: Secondary | ICD-10-CM

## 2022-12-31 DIAGNOSIS — K50812 Crohn's disease of both small and large intestine with intestinal obstruction: Secondary | ICD-10-CM

## 2022-12-31 DIAGNOSIS — K580 Irritable bowel syndrome with diarrhea: Secondary | ICD-10-CM

## 2022-12-31 MED ORDER — RIFAXIMIN 550 MG PO TABS
550.0000 mg | ORAL_TABLET | Freq: Three times a day (TID) | ORAL | 0 refills | Status: AC
Start: 2022-12-31 — End: 2023-01-14

## 2022-12-31 NOTE — Patient Instructions (Addendum)
Your provider has requested that you go to the basement level for lab work before leaving today. Press "B" on the elevator. The lab is located at the first door on the left as you exit the elevator.   We are treating you for small intestinal bacterial overgrowth, this can cause increase gas, bloating, loose stools or constipation.  We will treat a patient with an antibiotic to see if it helps.   Can do pepcid or famotidine over the counter since you have been on the prednisone.  Avoid spicy and acidic foods Avoid fatty foods Limit your intake of coffee, tea, alcohol, and carbonated drinks Work to maintain a healthy weight Keep the head of the bed elevated at least 3 inches with blocks or a wedge pillow if you are having any nighttime symptoms Stay upright for 2 hours after eating Avoid meals and snacks three to four hours before bedtime  Get on vitamin D once weekly 50,000 international units     We have you scheduled for a follow up on 04/28/2023 at 8:30am with Dr Lavon Paganini.   _______________________________________________________  If your blood pressure at your visit was 140/90 or greater, please contact your primary care physician to follow up on this.  _______________________________________________________  If you are age 86 or older, your body mass index should be between 23-30. Your Body mass index is 21.68 kg/m. If this is out of the aforementioned range listed, please consider follow up with your Primary Care Provider.  If you are age 36 or younger, your body mass index should be between 19-25. Your Body mass index is 21.68 kg/m. If this is out of the aformentioned range listed, please consider follow up with your Primary Care Provider.   ________________________________________________________  The [facility] providers would like to encourage you to use [facility] to communicate with providers for non-urgent requests or questions.  Due to long hold times on the telephone,  sending your provider a message by [facility] may be a faster and more efficient way to get a response.  Please allow 48 business hours for a response.  Please remember that this is for non-urgent requests.  _______________________________________________________ It was a pleasure to see you today!  Thank you for trusting me with your gastrointestinal care!

## 2023-01-05 ENCOUNTER — Ambulatory Visit: Payer: No Typology Code available for payment source | Admitting: Physician Assistant

## 2023-01-21 ENCOUNTER — Other Ambulatory Visit (INDEPENDENT_AMBULATORY_CARE_PROVIDER_SITE_OTHER): Payer: No Typology Code available for payment source

## 2023-01-21 DIAGNOSIS — K50812 Crohn's disease of both small and large intestine with intestinal obstruction: Secondary | ICD-10-CM

## 2023-01-21 DIAGNOSIS — R1031 Right lower quadrant pain: Secondary | ICD-10-CM

## 2023-01-21 LAB — BASIC METABOLIC PANEL
BUN: 14 mg/dL (ref 6–23)
CO2: 24 meq/L (ref 19–32)
Calcium: 8.9 mg/dL (ref 8.4–10.5)
Chloride: 106 meq/L (ref 96–112)
Creatinine, Ser: 0.63 mg/dL (ref 0.40–1.20)
GFR: 100.11 mL/min (ref >60.00)
Glucose, Bld: 94 mg/dL (ref 70–99)
Potassium: 3.7 meq/L (ref 3.5–5.1)
Sodium: 140 meq/L (ref 135–145)

## 2023-01-21 LAB — CBC WITH DIFFERENTIAL/PLATELET
Basophils Absolute: 0.1 10*3/uL (ref 0.0–0.1)
Basophils Relative: 0.9 % (ref 0.0–3.0)
Eosinophils Absolute: 0.2 10*3/uL (ref 0.0–0.7)
Eosinophils Relative: 2.5 % (ref 0.0–5.0)
HCT: 42.5 % (ref 36.0–46.0)
Hemoglobin: 13.4 g/dL (ref 12.0–15.0)
Lymphocytes Relative: 33.4 % (ref 12.0–46.0)
Lymphs Abs: 2.8 10*3/uL (ref 0.7–4.0)
MCHC: 31.6 g/dL (ref 30.0–36.0)
MCV: 83.5 fL (ref 78.0–100.0)
Monocytes Absolute: 0.7 10*3/uL (ref 0.1–1.0)
Monocytes Relative: 8.6 % (ref 3.0–12.0)
Neutro Abs: 4.6 10*3/uL (ref 1.4–7.7)
Neutrophils Relative %: 54.6 % (ref 43.0–77.0)
Platelets: 371 10*3/uL (ref 150.0–400.0)
RBC: 5.09 Mil/uL (ref 3.87–5.11)
RDW: 15.3 % (ref 11.5–15.5)
WBC: 8.4 10*3/uL (ref 4.0–10.5)

## 2023-01-21 LAB — HEPATIC FUNCTION PANEL
ALT: 24 U/L (ref 0–35)
AST: 19 U/L (ref 0–37)
Albumin: 3.7 g/dL (ref 3.5–5.2)
Alkaline Phosphatase: 89 U/L (ref 39–117)
Bilirubin, Direct: 0.1 mg/dL (ref 0.0–0.3)
Total Bilirubin: 1 mg/dL (ref 0.2–1.2)
Total Protein: 6.8 g/dL (ref 6.0–8.3)

## 2023-01-21 LAB — SEDIMENTATION RATE: Sed Rate: 21 mm/h (ref 0–30)

## 2023-01-21 LAB — HIGH SENSITIVITY CRP: CRP, High Sensitivity: 1.32 mg/L (ref 0.000–5.000)

## 2023-02-04 LAB — INFLIXIMAB+AB (SERIAL MONITOR)
Anti-Infliximab Antibody: <22 ng/mL
Infliximab Drug Level: 26 ug/mL

## 2023-02-19 ENCOUNTER — Emergency Department (HOSPITAL_BASED_OUTPATIENT_CLINIC_OR_DEPARTMENT_OTHER)
Admission: EM | Admit: 2023-02-19 | Discharge: 2023-02-19 | Disposition: A | Discharge status: Home / Self Care | Payer: No Typology Code available for payment source | Attending: Emergency Medicine | Admitting: Emergency Medicine

## 2023-02-19 ENCOUNTER — Emergency Department (HOSPITAL_BASED_OUTPATIENT_CLINIC_OR_DEPARTMENT_OTHER): Payer: No Typology Code available for payment source

## 2023-02-19 ENCOUNTER — Other Ambulatory Visit: Payer: Self-pay

## 2023-02-19 ENCOUNTER — Encounter (HOSPITAL_BASED_OUTPATIENT_CLINIC_OR_DEPARTMENT_OTHER): Payer: Self-pay | Admitting: Emergency Medicine

## 2023-02-19 DIAGNOSIS — N200 Calculus of kidney: Secondary | ICD-10-CM | POA: Insufficient documentation

## 2023-02-19 DIAGNOSIS — R109 Unspecified abdominal pain: Secondary | ICD-10-CM | POA: Diagnosis present

## 2023-02-19 LAB — URINALYSIS, ROUTINE W REFLEX MICROSCOPIC
Bacteria, UA: NONE SEEN
Bilirubin Urine: NEGATIVE
Glucose, UA: NEGATIVE mg/dL
Ketones, ur: NEGATIVE mg/dL
Leukocytes,Ua: NEGATIVE
Nitrite: NEGATIVE
Specific Gravity, Urine: 1.026 (ref 1.005–1.030)
pH: 5 (ref 5.0–8.0)

## 2023-02-19 LAB — CBC WITH DIFFERENTIAL/PLATELET
Abs Immature Granulocytes: 0.01 10*3/uL (ref 0.00–0.07)
Basophils Absolute: 0.1 10*3/uL (ref 0.0–0.1)
Basophils Relative: 1 %
Eosinophils Absolute: 0.2 10*3/uL (ref 0.0–0.5)
Eosinophils Relative: 2 %
HCT: 43 % (ref 36.0–46.0)
Hemoglobin: 13.9 g/dL (ref 12.0–15.0)
Immature Granulocytes: 0 %
Lymphocytes Relative: 50 %
Lymphs Abs: 4.3 10*3/uL — ABNORMAL HIGH (ref 0.7–4.0)
MCH: 27.1 pg (ref 26.0–34.0)
MCHC: 32.3 g/dL (ref 30.0–36.0)
MCV: 84 fL (ref 80.0–100.0)
Monocytes Absolute: 0.6 10*3/uL (ref 0.1–1.0)
Monocytes Relative: 7 %
Neutro Abs: 3.4 10*3/uL (ref 1.7–7.7)
Neutrophils Relative %: 40 %
Platelets: 372 10*3/uL (ref 150–400)
RBC: 5.12 MIL/uL — ABNORMAL HIGH (ref 3.87–5.11)
RDW: 14.6 % (ref 11.5–15.5)
WBC: 8.7 10*3/uL (ref 4.0–10.5)
nRBC: 0 % (ref 0.0–0.2)

## 2023-02-19 LAB — COMPREHENSIVE METABOLIC PANEL
ALT: 30 U/L (ref 0–44)
AST: 26 U/L (ref 15–41)
Albumin: 3.9 g/dL (ref 3.5–5.0)
Alkaline Phosphatase: 88 U/L (ref 38–126)
Anion gap: 10 (ref 5–15)
BUN: 11 mg/dL (ref 6–20)
CO2: 22 mmol/L (ref 22–32)
Calcium: 8.6 mg/dL — ABNORMAL LOW (ref 8.9–10.3)
Chloride: 106 mmol/L (ref 98–111)
Creatinine, Ser: 0.67 mg/dL (ref 0.44–1.00)
GFR, Estimated: >60 mL/min (ref >60)
Glucose, Bld: 117 mg/dL — ABNORMAL HIGH (ref 70–99)
Potassium: 3.6 mmol/L (ref 3.5–5.1)
Sodium: 138 mmol/L (ref 135–145)
Total Bilirubin: 1.3 mg/dL — ABNORMAL HIGH (ref <1.2)
Total Protein: 6.6 g/dL (ref 6.5–8.1)

## 2023-02-19 LAB — LIPASE, BLOOD: Lipase: 50 U/L (ref 11–51)

## 2023-02-19 MED ORDER — ONDANSETRON HCL 4 MG PO TABS: 4.0000 mg | ORAL_TABLET | Freq: Four times a day (QID) | ORAL | 0 refills | Status: DC

## 2023-02-19 MED ORDER — HYDROMORPHONE HCL 1 MG/ML IJ SOLN
1.0000 mg | Freq: Once | INTRAMUSCULAR | Status: AC
  Administered 2023-02-19: 1 mg via INTRAVENOUS
  Filled 2023-02-19: qty 1

## 2023-02-19 MED ORDER — ONDANSETRON HCL 4 MG/2ML IJ SOLN
4.0000 mg | Freq: Once | INTRAMUSCULAR | Status: AC
  Administered 2023-02-19: 4 mg via INTRAVENOUS
  Filled 2023-02-19: qty 2

## 2023-02-19 MED ORDER — OXYCODONE-ACETAMINOPHEN 5-325 MG PO TABS: 1.0000 | ORAL_TABLET | Freq: Four times a day (QID) | ORAL | 0 refills | Status: DC | PRN

## 2023-02-19 MED ORDER — LACTATED RINGERS IV BOLUS
1000.0000 mL | Freq: Once | INTRAVENOUS | Status: AC
  Administered 2023-02-19: 1000 mL via INTRAVENOUS

## 2023-02-19 MED ORDER — ONDANSETRON HCL 4 MG PO TABS: 4.0000 mg | ORAL_TABLET | Freq: Four times a day (QID) | ORAL | 0 refills | Status: AC

## 2023-02-19 MED ORDER — ONDANSETRON 4 MG PO TBDP: 4.0000 mg | ORAL_TABLET | Freq: Once | ORAL | Status: DC

## 2023-02-19 MED ORDER — TAMSULOSIN HCL 0.4 MG PO CAPS: 0.4000 mg | ORAL_CAPSULE | Freq: Every day | ORAL | 0 refills | Status: DC

## 2023-02-19 MED ORDER — IOHEXOL 300 MG/ML  SOLN
80.0000 mL | Freq: Once | INTRAMUSCULAR | Status: AC | PRN
  Administered 2023-02-19: 80 mL via INTRAVENOUS

## 2023-02-19 MED ORDER — MORPHINE SULFATE (PF) 4 MG/ML IV SOLN
4.0000 mg | Freq: Once | INTRAVENOUS | Status: AC
  Administered 2023-02-19: 4 mg via INTRAVENOUS
  Filled 2023-02-19: qty 1

## 2023-02-19 MED ORDER — KETOROLAC TROMETHAMINE 15 MG/ML IJ SOLN
15.0000 mg | Freq: Once | INTRAMUSCULAR | Status: AC
  Administered 2023-02-19: 15 mg via INTRAVENOUS
  Filled 2023-02-19: qty 1

## 2023-02-19 MED ORDER — TAMSULOSIN HCL 0.4 MG PO CAPS: 0.4000 mg | ORAL_CAPSULE | Freq: Every day | ORAL | 0 refills | Status: AC

## 2023-02-19 NOTE — Discharge Instructions (Addendum)
You were seen in ER today for evaluation of your pain. It was discovered that you have a kidney stone which is likely causing your pain. I'm glad that you are feeling better! For pain, I recommend taking Tylenol 650mg  every 6 hours as needed. I have also prescribed you some narcotic pain medication to take for breakthrough pain. Please do not drive or operate any heavy machinery while on this medication as it will make you sleepy.  I have also prescribed you medication called Flomax which you will take daily.  This helps open the tubes to allow the kidney stone come out.  I have also prescribed you some nausea medication to take as needed.  Please make sure you are drinking plenty of fluids and staying well-hydrated.  I have also included information for [facility] into the discharge paperwork.  Please call to schedule an appointment.  Additionally, I have sent in a culture of your urine.  It should result in the next few days.  Please make sure to answer your phone if you a phone call just in case the result is positive as you may to be placed on antibiotics.  If you have any concerns, new or worsening symptoms, please return to your nearest emerged part for evaluation.  Contact a doctor if: You have pain that gets worse or does not get better with medicine. Get help right away if: You have a fever or chills. You get very bad pain. You get new pain in your belly. You faint. You cannot pee.

## 2023-02-19 NOTE — ED Triage Notes (Signed)
2 days ago, frequent urination and urge to go. Today left back pain, not her Crohn's

## 2023-02-19 NOTE — ED Provider Notes (Signed)
[place] EMERGENCY DEPARTMENT AT [facility] Provider Note   CSN: [id number] Arrival date & time: 02/19/23  1509     History {Add pertinent medical, surgical, social history, OB history to HPI:1} Chief Complaint  Patient presents with   Back Pain    Karen Knox is a 57 y.o. female.   Back Pain      Home Medications Prior to Admission medications   Medication Sig Start Date End Date Taking? Authorizing Provider  Cholecalciferol (VITAMIN D) 125 MCG (5000 UT) CAPS Take 5,000 Units by mouth daily. Patient not taking: Reported on 12/31/2022    [provider]  cholestyramine light (PREVALITE) 4 GM/DOSE powder Take 1 packet (4 g total) by mouth daily. 01/23/21   Napoleon Form, MD  cyanocobalamin (VITAMIN B12) 1000 MCG/ML injection Inject 1 mL (1,000 mcg total) into the muscle every 30 (thirty) days. 12/13/22   Napoleon Form, MD  inFLIXimab (REMICADE) 100 MG injection 10mg /kg every 6 weeks Patient taking differently: Inject 100 mg into the muscle See admin instructions. 10mg /kg every 6 weeks 09/06/22   Napoleon Form, MD  MAGNESIUM MALATE PO Take 4,050 mg by mouth daily. 1350 mg each capsule    [provider]  ondansetron (ZOFRAN-ODT) 8 MG disintegrating tablet Take 1 tablet (8 mg total) by mouth every 8 (eight) hours as needed. 11/24/22   Doree Albee, PA-C  potassium citrate (UROCIT-K) 5 MEQ (540 MG) SR tablet Take 5 mEq by mouth daily. Patient not taking: Reported on 12/31/2022    [provider]      Allergies    Patient has no known allergies.    Review of Systems   Review of Systems  Musculoskeletal:  Positive for back pain.    Physical Exam Updated Vital Signs BP (!) 179/107   Pulse 62   Temp 98 F (36.7 C)   Resp 20   Wt 53.5 kg   LMP 02/17/2020   SpO2 100%   BMI 20.90 kg/m  Physical Exam  ED Results / Procedures / Treatments   Labs (all labs ordered are listed, but only abnormal results are  displayed) Labs Reviewed  CBC WITH DIFFERENTIAL/PLATELET - Abnormal; Notable for the following components:      Result Value   RBC 5.12 (*)    Lymphs Abs 4.3 (*)    All other components within normal limits  COMPREHENSIVE METABOLIC PANEL - Abnormal; Notable for the following components:   Glucose, Bld 117 (*)    Calcium 8.6 (*)    Total Bilirubin 1.3 (*)    All other components within normal limits  URINALYSIS, ROUTINE W REFLEX MICROSCOPIC - Abnormal; Notable for the following components:   APPearance HAZY (*)    Hgb urine dipstick MODERATE (*)    Protein, ur TRACE (*)    Non Squamous Epithelial 0-5 (*)    All other components within normal limits  URINE CULTURE  LIPASE, BLOOD    EKG None  Radiology CT ABDOMEN PELVIS W CONTRAST  Result Date: 02/19/2023 CLINICAL DATA:  Flank pain.  Crohn disease. EXAM: CT ABDOMEN AND PELVIS WITH CONTRAST TECHNIQUE: Multidetector CT imaging of the abdomen and pelvis was performed using the standard protocol following bolus administration of intravenous contrast. RADIATION DOSE REDUCTION: This exam was performed according to the departmental dose-optimization program which includes automated exposure control, adjustment of the mA and/or kV according to patient size and/or use of iterative reconstruction technique. CONTRAST:  80mL OMNIPAQUE IOHEXOL 300 MG/ML  SOLN  COMPARISON:  CT 10/04/2022 and older FINDINGS: Lower chest: Slight linear opacity lung bases likely scar or atelectasis. No pleural effusion. Hepatobiliary: Previous cholecystectomy. Stable mild biliary duct ectasia. Patent portal vein. No space-occupying liver lesion Pancreas: Unremarkable. No pancreatic ductal dilatation or surrounding inflammatory changes. Spleen: Normal in size without focal abnormality. Adrenals/Urinary Tract: Adrenal glands are preserved. No enhancing renal mass. There is mild ectasia of the left renal collecting system, new from previous. There is some perinephric stranding  and fluid identified as well on the left the ectatic ureter seen down to pelvis where there is a distal left ureteral stone measuring 3 mm on series 2, image 72 just proximal to the UVJ. The urinary bladder is contracted. No additional renal or ureteral stones identified. The left ureter has a normal course and caliber Stomach/Bowel: Surgical changes seen from partial right hemicolectomy and primary anastomosis. More distally the large bowel has a normal course and caliber with scattered stool. Stomach is nondilated. Small bowel overall is nondilated. Of the surgical margin is some ectasia of the loop of small bowel but less so than previous. Slight mucosal thickening in this location. No significant inflammatory changes Vascular/Lymphatic: No significant vascular findings are present. No enlarged abdominal or pelvic lymph nodes. Few prominent right hemi abdominal mesenteric nodes. Reproductive: Uterus and bilateral adnexa are unremarkable. Other: No free air or free fluid. Musculoskeletal: Slight degenerative changes Schmorl's node deformities. IMPRESSION: New 3 mm left distal ureteral stone just proximal to the UVJ with mild collecting system dilatation and perinephric stranding. Surgical changes from partial right hemicolectomy with primary anastomosis. Decreasing dilatation of the small bowel. No inflammatory changes. There is some mild mucosal thickening of the distal small bowel but no hyperenhancement or inflammatory change Previous cholecystectomy. Electronically Signed   By: Karen Kays M.D.   On: 02/19/2023 16:56    Procedures Procedures  {Document cardiac monitor, telemetry assessment procedure when appropriate:1}  Medications Ordered in ED Medications  ketorolac (TORADOL) 15 MG/ML injection 15 mg (has no administration in time range)  lactated ringers bolus 1,000 mL (has no administration in time range)  morphine (PF) 4 MG/ML injection 4 mg (4 mg Intravenous Given 02/19/23 1555)  ondansetron  (ZOFRAN) injection 4 mg (4 mg Intravenous Given 02/19/23 1555)  HYDROmorphone (DILAUDID) injection 1 mg (1 mg Intravenous Given 02/19/23 1630)  iohexol (OMNIPAQUE) 300 MG/ML solution 80 mL (80 mLs Intravenous Contrast Given 02/19/23 1637)    ED Course/ Medical Decision Making/ A&P Clinical Course as of 02/19/23 1930  Sat Feb 19, 2023  1732 Spoke with Dr. Arita Miss with urology. Discussed CT findings and urinalysis. Does not think antibiotics are needed. Can follow up outpatient with flomax and pain medications. [RR]    Clinical Course User Index [RR] Achille Rich, PA-C   {   Click here for ABCD2, HEART and other calculatorsREFRESH Note before signing :1}                              Medical Decision Making Amount and/or Complexity of Data Reviewed Labs: ordered. Radiology: ordered.  Risk Prescription drug management.   ***  {Document critical care time when appropriate:1} {Document review of labs and clinical decision tools ie heart score, Chads2Vasc2 etc:1}  {Document your independent review of radiology images, and any outside records:1} {Document your discussion with family members, caretakers, and with consultants:1} {Document social determinants of health affecting pt's care:1} {Document your decision making why or why not admission,  treatments were needed:1} Final Clinical Impression(s) / ED Diagnoses Final diagnoses:  None    Rx / DC Orders ED Discharge Orders     None

## 2023-02-19 NOTE — ED Notes (Signed)
 RN reviewed discharge instructions with pt. Pt verbalized understanding and had no further questions. VSS upon discharge.  

## 2023-02-20 LAB — URINE CULTURE: Culture: <10000 — AB

## 2023-03-15 LAB — HM PAP SMEAR: HPV, high-risk: NEGATIVE

## 2023-04-13 ENCOUNTER — Encounter: Payer: Self-pay | Admitting: Family Medicine

## 2023-04-13 ENCOUNTER — Ambulatory Visit: Payer: No Typology Code available for payment source | Admitting: Family Medicine

## 2023-04-13 VITALS — BP 110/64 | HR 65 | Temp 97.8°F | Ht 63.0 in | Wt 119.0 lb

## 2023-04-13 DIAGNOSIS — J0100 Acute maxillary sinusitis, unspecified: Secondary | ICD-10-CM

## 2023-04-13 MED ORDER — PREDNISONE 10 MG PO TABS: ORAL_TABLET | ORAL | 0 refills | Status: DC

## 2023-04-13 MED ORDER — AZELASTINE HCL 0.1 % NA SOLN: 1.0000 | Freq: Two times a day (BID) | NASAL | 5 refills | Status: DC

## 2023-04-13 MED ORDER — MOMETASONE FUROATE 50 MCG/ACT NA SUSP: 2.0000 | Freq: Every day | NASAL | 12 refills | Status: DC

## 2023-04-13 NOTE — Patient Instructions (Addendum)
Follow up as needed or as scheduled START the Prednisone as directed- 3 pills at the same time x3 days, then 2 pills at the same time x3 days, then 1 pill daily.  Take w/ food  USE the Nasonex spray daily USE the Azelastine spray TWICE daily ADD a daily Claritin or Zyrtec until feeling better (store brand works just as well) Call with any questions or concerns Hang in there!

## 2023-04-13 NOTE — Progress Notes (Signed)
   Subjective:    Patient ID: Karen Knox, female    DOB: September 26, 1965, 58 y.o.   MRN: [id number]  HPI Sinus problem- pt reports she was sick weeks ago and now feels that she is not sick but that sinuses remain swollen and inflamed.  She will having facial swelling.  No relief w/ OTC meds.  Denies HA.  Feels breathing is mildly obstructed.  Took some Prednisone last week- 50mg  --> 10mg .  Sxs improved while on the Prednisone but returned once she stopped medication.  Has been taking Dayquil since Christmas until starting Prednisone.   Review of Systems For ROS see HPI     Objective:   Physical Exam Vitals reviewed.  Constitutional:      General: She is not in acute distress.    Appearance: Normal appearance. She is not ill-appearing.  HENT:     Head: Normocephalic and atraumatic.     Right Ear: Tympanic membrane is retracted.     Left Ear: Tympanic membrane and ear canal normal.     Nose:     Comments: No TTP over frontal or maxillary sinuses Mild swelling of L maxillary sinus Musculoskeletal:     Cervical back: Neck supple.  Lymphadenopathy:     Cervical: No cervical adenopathy.  Skin:    General: Skin is warm and dry.  Neurological:     General: No focal deficit present.     Mental Status: She is alert and oriented to person, place, and time.  Psychiatric:        Mood and Affect: Mood normal.        Behavior: Behavior normal.        Thought Content: Thought content normal.           Assessment & Plan:  Sinusitis- new.  Pt has inflammation but not infection of her sinuses.  Some improvement with Prednisone last week but this was likely too rapid of a taper.  Will do a lower dose but longer Prednisone taper, add a nasal steroid, nasal antihistamine, and daily oral antihistamine to improve sxs.  Pt expressed understanding and is in agreement w/ plan.

## 2023-04-28 ENCOUNTER — Ambulatory Visit: Payer: No Typology Code available for payment source | Admitting: Gastroenterology

## 2023-04-28 ENCOUNTER — Encounter: Payer: Self-pay | Admitting: Gastroenterology

## 2023-04-28 ENCOUNTER — Ambulatory Visit (INDEPENDENT_AMBULATORY_CARE_PROVIDER_SITE_OTHER)
Admission: RE | Admit: 2023-04-28 | Discharge: 2023-04-28 | Disposition: A | Discharge status: Home / Self Care | Payer: No Typology Code available for payment source | Source: Ambulatory Visit | Attending: Gastroenterology | Admitting: Gastroenterology

## 2023-04-28 ENCOUNTER — Other Ambulatory Visit (INDEPENDENT_AMBULATORY_CARE_PROVIDER_SITE_OTHER): Payer: No Typology Code available for payment source

## 2023-04-28 VITALS — BP 122/80 | HR 67 | Ht 63.0 in | Wt 118.0 lb

## 2023-04-28 DIAGNOSIS — K588 Other irritable bowel syndrome: Secondary | ICD-10-CM

## 2023-04-28 DIAGNOSIS — Z7952 Long term (current) use of systemic steroids: Secondary | ICD-10-CM | POA: Diagnosis not present

## 2023-04-28 DIAGNOSIS — Z227 Latent tuberculosis: Secondary | ICD-10-CM | POA: Diagnosis not present

## 2023-04-28 DIAGNOSIS — K50812 Crohn's disease of both small and large intestine with intestinal obstruction: Secondary | ICD-10-CM

## 2023-04-28 LAB — HIGH SENSITIVITY CRP: CRP, High Sensitivity: 1.1 mg/L (ref 0.000–5.000)

## 2023-04-28 LAB — CBC WITH DIFFERENTIAL/PLATELET
Basophils Absolute: 0 10*3/uL (ref 0.0–0.1)
Basophils Relative: 0.7 % (ref 0.0–3.0)
Eosinophils Absolute: 0.1 10*3/uL (ref 0.0–0.7)
Eosinophils Relative: 1.4 % (ref 0.0–5.0)
HCT: 40.7 % (ref 36.0–46.0)
Hemoglobin: 13.2 g/dL (ref 12.0–15.0)
Lymphocytes Relative: 41 % (ref 12.0–46.0)
Lymphs Abs: 2.4 10*3/uL (ref 0.7–4.0)
MCHC: 32.4 g/dL (ref 30.0–36.0)
MCV: 84 fL (ref 78.0–100.0)
Monocytes Absolute: 0.6 10*3/uL (ref 0.1–1.0)
Monocytes Relative: 9.9 % (ref 3.0–12.0)
Neutro Abs: 2.8 10*3/uL (ref 1.4–7.7)
Neutrophils Relative %: 47 % (ref 43.0–77.0)
Platelets: 332 10*3/uL (ref 150.0–400.0)
RBC: 4.84 Mil/uL (ref 3.87–5.11)
RDW: 15.1 % (ref 11.5–15.5)
WBC: 5.9 10*3/uL (ref 4.0–10.5)

## 2023-04-28 LAB — IBC + FERRITIN
Ferritin: 6.7 ng/mL — ABNORMAL LOW (ref 10.0–291.0)
Iron: 60 ug/dL (ref 42–145)
Saturation Ratios: 12.4 % — ABNORMAL LOW (ref 20.0–50.0)
TIBC: 484.4 ug/dL — ABNORMAL HIGH (ref 250.0–450.0)
Transferrin: 346 mg/dL (ref 212.0–360.0)

## 2023-04-28 LAB — B12 AND FOLATE PANEL
Folate: >25.2 ng/mL (ref >5.9)
Vitamin B-12: 428 pg/mL (ref 211–911)

## 2023-04-28 LAB — COMPREHENSIVE METABOLIC PANEL
ALT: 17 U/L (ref 0–35)
AST: 19 U/L (ref 0–37)
Albumin: 3.6 g/dL (ref 3.5–5.2)
Alkaline Phosphatase: 77 U/L (ref 39–117)
BUN: 11 mg/dL (ref 6–23)
CO2: 29 meq/L (ref 19–32)
Calcium: 8.6 mg/dL (ref 8.4–10.5)
Chloride: 104 meq/L (ref 96–112)
Creatinine, Ser: 0.66 mg/dL (ref 0.40–1.20)
GFR: 98.81 mL/min (ref >60.00)
Glucose, Bld: 68 mg/dL — ABNORMAL LOW (ref 70–99)
Potassium: 3.8 meq/L (ref 3.5–5.1)
Sodium: 142 meq/L (ref 135–145)
Total Bilirubin: 1.2 mg/dL (ref 0.2–1.2)
Total Protein: 6.1 g/dL (ref 6.0–8.3)

## 2023-04-28 LAB — VITAMIN D 25 HYDROXY (VIT D DEFICIENCY, FRACTURES): VITD: 14.21 ng/mL — ABNORMAL LOW (ref 30.00–100.00)

## 2023-04-28 MED ORDER — SUFLAVE 178.7 G PO SOLR: 1.0000 | Freq: Once | ORAL | 0 refills | Status: AC

## 2023-04-28 NOTE — Progress Notes (Signed)
 Karen Knox    [id number]    March 29, 1965  Primary Care Physician:Tabori, Comer FORBES, MD  Referring Physician: Mahlon Comer FORBES, MD [id number]  [place] [address]   Chief complaint:  Crohn's  Discussed the use of AI scribe software for clinical note transcription with the patient, who gave verbal consent to proceed.  History of Present Illness   Karen Knox is a 58 year old very pleasant female with Crohn's disease who presents for follow-up regarding her condition.  Her Crohn's disease symptoms have been stable with the current Remicade  treatment at a dose of 10 mg/kg. Previously, pain and discomfort were major issues, but she now maintains a limited diet to manage symptoms. Chocolate has been identified as a trigger for stomach upset and is avoided. As long as she eats within her acceptable range, her stomach feels good.  She experiences bowel movements twice a day, which she considers normal. No episodes of blood, dark stool, or mucus are reported. She can differentiate between symptoms caused by dietary choices and those caused by stress, noting that stress at work can affect her symptoms. No prolonged episodes of loss of control over her symptoms have occurred.  She is trying to incorporate exercise into her routine to help manage stress, which she identifies as a factor influencing her symptoms. She has not had any recent episodes where she felt she lost control of her symptoms.  She recalls long-term use of prednisone  approximately 25 years ago and expresses concern about bone density, as she was previously on Fosamax and underwent bone density scans. She mentions a history of latent TB and recalls undergoing chest x-rays and blood tests for monitoring.     GI history: Hospitalized in July 2024 with small bowel obstruction, spontaneously improved with steroids and increase in dose of infliximab   She had an acute flare of Crohn's disease in  January 2022, was initially treated with prednisone  taper and followed by budesonide  taper dose.  She did not tolerate 6-MP, stopped it after 3 weeks.    Colonoscopy April 18, 2020: - Patent end-to-side ileo-colonic anastomosis, characterized by ulceration. - A few ulcers in the terminal ileum. Biopsied. - Normal mucosa in the entire examined colon. - The examination was otherwise normal.   Surgical [P], small bowel, terminal ileum ulcers - SEVERELY ACTIVE CHRONIC ILEITIS WITH ULCERATION, CONSISTENT WITH PATIENT'S CLINICAL HISTORY OF CROHN'S DISEASE - NEGATIVE FOR GRANULOMAS OR DYSPLASIA - VIRAL CYTOPATHIC CHANGES ARE NOT SEENSurgical [P], small bowel, terminal ileum ulcers - SEVERELY ACTIVE CHRONIC ILEITIS WITH ULCERATION, CONSISTENT WITH PATIENT'S CLINICAL HISTORY OF CROHN'S DISEASE - NEGATIVE FOR GRANULOMAS OR DYSPLASIA - VIRAL CYTOPATHIC CHANGES ARE NOT SEEN    Colonoscopy July 2018 showed terminal ileal stricture with erosions and friable tissue, biopsies showed evidence of active ileitis.     After colonoscopy she was treated with Entocort for 30 days with improvement of symptoms.   October 07, 2016: Infliximab   drug trough of 21 with undetectable antibody.  She was getting infliximab  infusions every 6 weeks   Repeat on April 19, 2017 :infliximab  drug trough was 11 with undetectable antibodies    Colonoscopy 04/18/20: - Patent end-to-side ileo-colonic anastomosis, characteri zed by ulceration. - A few ulcers in the terminal ileum. Biopsied. - Normal mucosa in the entire examined colon. - The examination was otherwise normal.   Surgical [P], small bowel, terminal ileum ulcers - SEVERELY ACTIVE CHRONIC ILEITIS WITH  ULCERATION, CONSISTENT WITH PATIENT'S CLINICAL HISTORY OF CROHN'S DISEASE - NEGATIVE FOR GRANULOMAS OR DYSPLASIA - VIRAL CYTOPATHIC CHANGES ARE NOT SEEN     Outpatient Encounter Medications as of 04/28/2023  Medication Sig   azelastine  (ASTELIN ) 0.1 % nasal  spray Place 1 spray into both nostrils 2 (two) times daily. Use in each nostril as directed   Cholecalciferol (VITAMIN D ) 125 MCG (5000 UT) CAPS Take 5,000 Units by mouth daily.   cholestyramine  light (PREVALITE ) 4 GM/DOSE powder Take 1 packet (4 g total) by mouth daily.   cyanocobalamin  (VITAMIN B12) 1000 MCG/ML injection Inject 1 mL (1,000 mcg total) into the muscle every 30 (thirty) days.   inFLIXimab  (REMICADE ) 100 MG injection 10mg /kg every 6 weeks (Patient taking differently: Inject 100 mg into the muscle See admin instructions. 10mg /kg every 6 weeks)   MAGNESIUM MALATE PO Take 4,050 mg by mouth daily. 1350 mg each capsule   mometasone  (NASONEX ) 50 MCG/ACT nasal spray Place 2 sprays into the nose daily.   ondansetron  (ZOFRAN ) 4 MG tablet Take 1 tablet (4 mg total) by mouth every 6 (six) hours.   ondansetron  (ZOFRAN -ODT) 8 MG disintegrating tablet Take 1 tablet (8 mg total) by mouth every 8 (eight) hours as needed.   SUFLAVE  178.7 g SOLR Take 1 kit by mouth once for 1 dose.   predniSONE  (DELTASONE ) 10 MG tablet 3 tabs x3 days and then 2 tabs x3 days and then 1 tab x3 days.  Take w/ food. (Patient not taking: Reported on 04/28/2023)   No facility-administered encounter medications on file as of 04/28/2023.    Allergies as of 04/28/2023   (No Known Allergies)    Past Medical History:  Diagnosis Date   Anal fissure    Arthritis    ? IBD related?   B12 deficiency    Crohn's disease (HCC)    External hemorrhoids    Gilbert's syndrome    Internal hemorrhoids    Iron  deficiency anemia    Small bowel stricture (HCC)    multiple 07/05/00   TB (tuberculosis)    latent- was treated because pt was on Remicade ; October 2016    Past Surgical History:  Procedure Laterality Date   APPENDECTOMY     with initial colon resection including terminal ileum   BREAST BIOPSY Right 06/04/2022   MM RT BREAST BX W LOC DEV 1ST LESION IMAGE BX SPEC STEREO GUIDE 06/04/2022 [facility]    CHOLECYSTECTOMY     COLON RESECTION  1993, 2002   x2   COLONOSCOPY     EXPLORATORY LAPAROTOMY  2002   with extensive lysis of adhesions, resection of ileocolonic anastomosis,resection segment small bowel; anastomosis of small bowel  to small bowel; ileocolic anastomosis   UPPER GASTROINTESTINAL ENDOSCOPY      Family History  Problem Relation Age of Onset   Melanoma Mother    Breast cancer Mother 54   Arthritis Father        and on mothers side   Multiple sclerosis Father    Colon cancer Father 10   Lung cancer Father        smoker   Esophageal cancer Neg Hx    Rectal cancer Neg Hx    Stomach cancer Neg Hx     Social History   Socioeconomic History   Marital status: Married    Spouse name: tom   Number of children: 2   Years of education: Not on file   Highest education level: Not on file  Occupational  History   Occupation: Field Seismologist   Tobacco Use   Smoking status: Never   Smokeless tobacco: Never  Vaping Use   Vaping status: Never Used  Substance and Sexual Activity   Alcohol use: Yes    Comment: occasional   Drug use: No   Sexual activity: Yes    Birth control/protection: Other-see comments  Other Topics Concern   Not on file  Social History Narrative   Not on file   Social Drivers of Health   Financial Resource Strain: Not on file  Food Insecurity: No Food Insecurity (10/04/2022)   Hunger Vital Sign    Worried About Running Out of Food in the Last Year: Never true    Ran Out of Food in the Last Year: Never true  Transportation Needs: No Transportation Needs (10/04/2022)   PRAPARE - Administrator, Civil Service (Medical): No    Lack of Transportation (Non-Medical): No  Physical Activity: Not on file  Stress: Not on file  Social Connections: Not on file  Intimate Partner Violence: Not At Risk (10/04/2022)   Humiliation, Afraid, Rape, and Kick questionnaire    Fear of Current or Ex-Partner: No    Emotionally Abused: No    Physically  Abused: No    Sexually Abused: No      Review of systems: All other review of systems negative except as mentioned in the HPI.   Physical Exam: Vitals:   04/28/23 0826  BP: 122/80  Pulse: 67   Body mass index is 20.9 kg/m. Gen:      No acute distress HEENT:  sclera anicteric CV: s1s2 rrr, no murmur Lungs: B/l clear. Abd:      soft, non-tender; no palpable masses, no distension Ext:    No edema Neuro: alert and oriented x 3 Psych: normal mood and affect  Data Reviewed:  Reviewed labs, radiology imaging, old records and pertinent past GI work up     Assessment and Plan    Crohn's Disease Remicade  (10 mg/kg) effectively managing symptoms. Limited diet avoiding triggers like chocolate. Bowel movements twice daily without blood, dark stool, or mucus. Stress at work exacerbates symptoms, but she differentiates between dietary and stress-related triggers. No recent episodes of loss of control. Inflammatory markers (CRP, ESR) within normal range, though she may not always reflect inflammation extent. Colonoscopy recommended to assess inflammation and disease status. Discussed risks and benefits of colonoscopy, including potential need for biopsies and drug trough level monitoring. - Order CBC, CMP, Vitamin D , B12, and TB test - Schedule colonoscopy with biopsies - Check drug trough levels later this year - Continue current Remicade  dosage  Irritable Bowel Syndrome (IBS) Overlapping symptoms of IBS fluctuate with diet and stress. No prolonged episodes indicative of Crohn's flare-ups. Emphasized importance of differentiating between IBS and Crohn's symptoms during prolonged episodes. - Monitor symptoms and differentiate between IBS and Crohn's flare-ups - Encourage stress management and dietary monitoring  Latent Tuberculosis Latent TB with periodic chest x-rays and blood tests. Concern about recent TB outbreak and potential reactivation due to immunosuppressed state. Discussed  risks of TB reactivation and need for monitoring. - Order TB blood test - If TB test is positive, order chest x-ray  General Health Maintenance Concerned about long-term effects of prednisone  on bone density. Agreed that a DEXA scan is warranted given history and age. - Order DEXA scan to assess bone density  Follow-up - Schedule colonoscopy for March 21st at 8:00 AM - Perform blood work today -  Follow-up appointment in six months.       The patient was provided an opportunity to ask questions and all were answered. The patient agreed with the plan and demonstrated an understanding of the instructions.  Karen Knox , MD    CC: Tabori, Katherine E, MD

## 2023-04-28 NOTE — Patient Instructions (Addendum)
 VISIT SUMMARY:  Karen Knox, during today's visit, we discussed the management of your Crohn's disease, your overlapping symptoms of Irritable Bowel Syndrome (IBS), and your history of latent tuberculosis. We also addressed your concerns about bone density due to long-term prednisone  use in the past.  YOUR PLAN:  -CROHN'S DISEASE: Crohn's disease is a chronic inflammatory condition of the gastrointestinal tract. Your symptoms are currently well-managed with Remicade  at 10 mg/kg, and you are avoiding dietary triggers like chocolate. We will continue with your current treatment and have scheduled a colonoscopy to assess inflammation and disease status. We will also perform blood tests (CBC, CMP, CRP, Vit D, TB gold, B12, folate and iron  panel)  -IRRITABLE BOWEL SYNDROME (IBS): IBS is a disorder that affects the large intestine, causing symptoms like cramping, abdominal pain, bloating, gas, and diarrhea or constipation. Your IBS symptoms fluctuate with diet and stress. It is important to differentiate between IBS and Crohn's symptoms, especially during prolonged episodes. Continue to manage stress and monitor your diet.  -LATENT TUBERCULOSIS: Latent TB is a condition where the tuberculosis bacteria are present in the body but inactive. Given your immunosuppressed state, there is a risk of reactivation. We will perform a TB blood test and, if positive, follow up with a chest x-ray.  -GENERAL HEALTH MAINTENANCE: We discussed your concern about the long-term effects of prednisone  on your bone density. A DEXA scan has been ordered to assess your bone density, given your history and age.  INSTRUCTIONS:  Please schedule your colonoscopy for March 21st at 8:00 AM. Perform the blood work today as discussed. We will have a follow-up appointment in six months.  You will receive your bowel preparation through [facility], which ensures the lowest copay and home delivery, with outreach via text or call from an [phone]  number. Please respond promptly to avoid rescheduling. If you are interested in alternative options or have any questions please contact them at [phone]  Your Provider Has Sent Your Bowel Prep Regimen To [facility] What to expect. [facility] will contact you to verify your information and collect your copay, if applicable. Enjoy the comfort of your home while we deliver your prescription to you, free of any shipping charges. Fast, FREE delivery or shipping. [facility] accepts all major insurance benefits and applies discounts & coupons  Have additional questions? [facility]'s patient care team is always here to help.  Chat: [email or web address] Call: [phone] Email: [email or web address] NCPDP: [id number] How will we contact you? Welcome Phone call  a Welcome text and a Checkout link in a text Texts you receive from [phone] Are Not Spam.   *To set up delivery, you must complete the checkout process via link or speak to one of our patient care representatives. If we are unable to reach you, your prescription may be delayed.  To avoid waiting on hold if you call. Utilize the secure chat feature and request [facility] call you to complete the transaction or expedite your concerns.  You have been scheduled for a bone density test on TODAY at [facility] level . Please arrive 15 minutes prior to your scheduled appointment to radiology on the basement floor of [facility] location for this test. If you need to cancel or reschedule for any reason, please contact radiology at [phone].  Preparation for test is as follows:  If you are taking calcium, discontinue this 24-48 hours prior to your appointment.  Wear pants with an elastic waistband (or without any metal such as a zipper).  Do not wear  an underwire bra.  We do have gowns if you are unable to find appropriate clothing without metal.  Please bring a list of all current medications. You have  been scheduled for a colonoscopy. Please follow written instructions given to you at your visit today.   If you use inhalers (even only as needed), please bring them with you on the day of your procedure.  DO NOT TAKE 7 DAYS PRIOR TO TEST- Trulicity (dulaglutide) Ozempic, Wegovy (semaglutide) Mounjaro (tirzepatide) Bydureon Bcise (exanatide extended release)  DO NOT TAKE 1 DAY PRIOR TO YOUR TEST Rybelsus (semaglutide) Adlyxin (lixisenatide) Victoza (liraglutide) Byetta (exanatide) ___________________________________________________________________________  Karen Knox will receive your bowel preparation through [facility], which ensures the lowest copay and home delivery, with outreach via text or call from an [phone] number. Please respond promptly to avoid rescheduling of your procedure. If you are interested in alternative options or have any questions regarding your prep, please contact them at [phone] ____________________________________________________________________________  Your Provider Has Sent Your Bowel Prep Regimen To [facility]   [facility] will contact you to verify your information and collect your copay, if applicable. Enjoy the comfort of your home while your prescription is mailed to you, FREE of any shipping charges.   [facility] accepts all major insurance benefits and applies discounts & coupons.  Have additional questions?   Chat: [email or web address] Call: [phone] Email: [email or web address] NCPDP: [id number]  How will [facility] contact you?  With a Welcome phone call,  a Welcome text and a checkout link in text form.  Texts you receive from [phone] Are NOT Spam.  *To set up delivery, you must complete the checkout process via link or speak to one of the patient care representatives. If [facility] is unable to reach you, your prescription may be delayed.  To avoid long hold times on the phone, you may also utilize the secure chat feature  on the [facility] website to request that they call you back for transaction completion or to expedite your concerns.      Due to recent changes in healthcare laws, you may see the results of your imaging and laboratory studies on MyChart before your provider has had a chance to review them.  We understand that in some cases there may be results that are confusing or concerning to you. Not all laboratory results come back in the same time frame and the provider may be waiting for multiple results in order to interpret others.  Please give us  48 hours in order for your provider to thoroughly review all the results before contacting the office for clarification of your results.    I appreciate the  opportunity to care for you  Thank You   Kavitha Nandigam , MD

## 2023-04-29 ENCOUNTER — Encounter: Payer: Self-pay | Admitting: Gastroenterology

## 2023-04-29 ENCOUNTER — Other Ambulatory Visit: Payer: Self-pay

## 2023-04-29 MED ORDER — ERGOCALCIFEROL 1.25 MG (50000 UT) PO CAPS: 50000.0000 [IU] | ORAL_CAPSULE | ORAL | 0 refills | Status: DC

## 2023-04-30 LAB — QUANTIFERON-TB GOLD PLUS
Mitogen-NIL: 7.42 [IU]/mL
NIL: 0.49 [IU]/mL
QuantiFERON-TB Gold Plus: NEGATIVE
TB1-NIL: 0.21 [IU]/mL
TB2-NIL: <0 [IU]/mL

## 2023-05-12 ENCOUNTER — Encounter: Payer: Self-pay | Admitting: Family Medicine

## 2023-05-19 ENCOUNTER — Other Ambulatory Visit: Payer: Self-pay | Admitting: Gastroenterology

## 2023-06-03 ENCOUNTER — Encounter: Payer: Self-pay | Admitting: Gastroenterology

## 2023-06-10 ENCOUNTER — Encounter: Payer: Self-pay | Admitting: Gastroenterology

## 2023-06-10 ENCOUNTER — Ambulatory Visit: Payer: No Typology Code available for payment source | Admitting: Gastroenterology

## 2023-06-10 VITALS — BP 111/65 | HR 67 | Temp 98.0°F | Resp 13 | Ht 63.0 in | Wt 118.0 lb

## 2023-06-10 DIAGNOSIS — K644 Residual hemorrhoidal skin tags: Secondary | ICD-10-CM

## 2023-06-10 DIAGNOSIS — K648 Other hemorrhoids: Secondary | ICD-10-CM

## 2023-06-10 DIAGNOSIS — Z980 Intestinal bypass and anastomosis status: Secondary | ICD-10-CM

## 2023-06-10 DIAGNOSIS — Z1211 Encounter for screening for malignant neoplasm of colon: Secondary | ICD-10-CM | POA: Diagnosis present

## 2023-06-10 DIAGNOSIS — K50812 Crohn's disease of both small and large intestine with intestinal obstruction: Secondary | ICD-10-CM

## 2023-06-10 DIAGNOSIS — K633 Ulcer of intestine: Secondary | ICD-10-CM | POA: Diagnosis not present

## 2023-06-10 DIAGNOSIS — K529 Noninfective gastroenteritis and colitis, unspecified: Secondary | ICD-10-CM | POA: Diagnosis not present

## 2023-06-10 MED ORDER — SODIUM CHLORIDE 0.9 % IV SOLN: 500.0000 mL | INTRAVENOUS | Status: DC

## 2023-06-10 NOTE — Progress Notes (Signed)
 Pt sedate, gd SR's, VSS, report to RN

## 2023-06-10 NOTE — Patient Instructions (Signed)

## 2023-06-10 NOTE — Progress Notes (Signed)
 Called to room to assist during endoscopic procedure.  Patient ID and intended procedure confirmed with present staff. Received instructions for my participation in the procedure from the performing physician.

## 2023-06-10 NOTE — Op Note (Signed)
 [facility] Patient Name: Aliesha Dolata Procedure Date: 06/10/2023 9:08 AM MRN: [id number] Endoscopist: Napoleon Form , MD, [id number] Age: 58 Referring MD:  Date of Birth: 1965-08-19 Gender: Female Account #: [id number] Procedure:                Colonoscopy Indications:              High risk colon cancer surveillance: Crohn's                            colitis of 8 (or more) years duration with                            one-third (or more) of the colon involved Medicines:                Monitored Anesthesia Care Procedure:                Pre-Anesthesia Assessment:                           - Prior to the procedure, a History and Physical                            was performed, and patient medications and                            allergies were reviewed. The patient's tolerance of                            previous anesthesia was also reviewed. The risks                            and benefits of the procedure and the sedation                            options and risks were discussed with the patient.                            All questions were answered, and informed consent                            was obtained. Prior Anticoagulants: The patient has                            taken no anticoagulant or antiplatelet agents. ASA                            Grade Assessment: II - A patient with mild systemic                            disease. After reviewing the risks and benefits,                            the patient was deemed in satisfactory condition to  undergo the procedure.                           After obtaining informed consent, the colonoscope                            was passed under direct vision. Throughout the                            procedure, the patient's blood pressure, pulse, and                            oxygen saturations were monitored continuously. The                            Olympus Scope SN:  [id number] was introduced through                            the anus and advanced to the the cecum, identified                            by appendiceal orifice and ileocecal valve. The                            colonoscopy was performed without difficulty. The                            patient tolerated the procedure well. The quality                            of the bowel preparation was adequate to identify                            polyps greater than 5 mm in size. The terminal                            ileum, ileocecal valve, appendiceal orifice, and                            rectum were photographed. Scope In: 9:20:17 AM Scope Out: 9:33:07 AM Scope Withdrawal Time: 0 hours 10 minutes 33 seconds  Total Procedure Duration: 0 hours 12 minutes 50 seconds  Findings:                 The perianal and digital rectal examinations were                            normal.                           The Simple Endoscopic Score for Crohn's Disease was                            determined based on the endoscopic appearance of  the mucosa in the following segments:                           - Ileum: Findings include no ulcers present in the                            distal ileum except ulceration at ileocolonic                            anastomosis, less than 10% ulcerated surfaces, less                            than 50% of surfaces affected and no narrowings.                            Segment score: 2.                           - Right Colon: Findings include no ulcers present,                            no ulcerated surfaces, less than 50% of surfaces                            affected and no narrowings. Segment score: 1.                           - Transverse Colon: Findings include no ulcers                            present, no ulcerated surfaces, less than 50% of                            surfaces affected and no narrowings. Segment score:                             1.                           - Left Colon: Findings include no ulcers present,                            no ulcerated surfaces, less than 50% of surfaces                            affected and no narrowings. Segment score: 1.                           - Rectum: Findings include no ulcers present, no                            ulcerated surfaces, less than 50% of surfaces  affected and no narrowings. Segment score: 1.                           - Total SES-CD aggregate score: 6. Biopsies were                            taken with a cold forceps for histology.                           There was evidence of a prior end-to-side                            ileo-colonic anastomosis in the ascending colon.                            This was patent and was characterized by friable                            mucosa, moderate stenosis and ulceration. The                            anastomosis was traversed. Biopsies were taken with                            a cold forceps for histology.                           Non-bleeding external and internal hemorrhoids were                            found during retroflexion. Complications:            No immediate complications. Estimated Blood Loss:     Estimated blood loss was minimal. Impression:               - Simple Endoscopic Score for Crohn's Disease: 6,                            mucosal inflammatory changes secondary to Crohn's                            disease, with ileitis and colitis. Biopsied.                           - Patent end-to-side ileo-colonic anastomosis,                            characterized by friable mucosa, ulceration and                            moderate stenosis. Biopsied.                           - Non-bleeding external and internal hemorrhoids. Recommendation:           - Resume previous diet.                           -  Continue present medications.                           -  Await pathology results.                           - Repeat colonoscopy in 3 years for surveillance.                           - Return to GI clinic in 3 months.                           - Refer to a colo-rectal surgeon Dr Drue Dun at                            appointment to be scheduled. Napoleon Form, MD 06/10/2023 9:42:48 AM This report has been signed electronically.

## 2023-06-10 NOTE — Progress Notes (Signed)
 [facility] History and Physical   Primary Care Physician:  Sheliah Hatch, MD   Reason for Procedure:  Crohn's ileocolitis  Plan:    Surveillance colonoscopy with possible interventions as needed     HPI: Karen Knox is a very pleasant 58 y.o. female here for surveillance colonoscopy for Crohn's ileo colitis.   The risks and benefits as well as alternatives of endoscopic procedure(s) have been discussed and reviewed. All questions answered. The patient agrees to proceed.    Past Medical History:  Diagnosis Date   Anal fissure    Arthritis    ? IBD related?   B12 deficiency    Crohn's disease (HCC)    External hemorrhoids    Gilbert's syndrome    Internal hemorrhoids    Iron deficiency anemia    Small bowel stricture (HCC)    multiple 07/05/00   TB (tuberculosis)    "latent"- was treated because pt was on Remicade; October 2016    Past Surgical History:  Procedure Laterality Date   APPENDECTOMY     with initial colon resection including terminal ileum   BREAST BIOPSY Right 06/04/2022   MM RT BREAST BX W LOC DEV 1ST LESION IMAGE BX SPEC STEREO GUIDE 06/04/2022 [facility]   CHOLECYSTECTOMY     COLON RESECTION  1993, 2002   x2   COLONOSCOPY     EXPLORATORY LAPAROTOMY  2002   with extensive lysis of adhesions, resection of ileocolonic anastomosis,resection segment small bowel; anastomosis of small bowel  to small bowel; ileocolic anastomosis   UPPER GASTROINTESTINAL ENDOSCOPY      Prior to Admission medications   Medication Sig Start Date End Date Taking? Authorizing Provider  azelastine (ASTELIN) 0.1 % nasal spray Place 1 spray into both nostrils 2 (two) times daily. Use in each nostril as directed 04/13/23   Sheliah Hatch, MD  cholestyramine light (PREVALITE) 4 GM/DOSE powder Take 1 packet (4 g total) by mouth daily. Patient not taking: Reported on 06/10/2023 01/23/21   Napoleon Form, MD  cyanocobalamin (VITAMIN B12) 1000  MCG/ML injection Inject 1 mL (1,000 mcg total) into the muscle every 30 (thirty) days. 12/13/22   Napoleon Form, MD  desonide (DESOWEN) 0.05 % cream Apply topically 2 (two) times daily. Patient not taking: Reported on 06/10/2023 05/18/23   [provider]  inFLIXimab (REMICADE) 100 MG injection 10mg /kg every 6 weeks Patient taking differently: Inject 100 mg into the muscle See admin instructions. 10mg /kg every 6 weeks 09/06/22   Napoleon Form, MD  ketoconazole (NIZORAL) 2 % cream SMARTSIG:sparingly Topical Daily Patient not taking: Reported on 06/10/2023 05/02/23   [provider]  MAGNESIUM MALATE PO Take 4,050 mg by mouth daily. 1350 mg each capsule Patient not taking: Reported on 06/10/2023    [provider]  mometasone (NASONEX) 50 MCG/ACT nasal spray Place 2 sprays into the nose daily. Patient not taking: Reported on 06/10/2023 04/13/23   Sheliah Hatch, MD  ondansetron (ZOFRAN) 4 MG tablet Take 1 tablet (4 mg total) by mouth every 6 (six) hours. Patient not taking: Reported on 06/10/2023 02/19/23   Achille Rich, PA-C  ondansetron (ZOFRAN-ODT) 8 MG disintegrating tablet Take 1 tablet (8 mg total) by mouth every 8 (eight) hours as needed. Patient not taking: Reported on 06/10/2023 11/24/22   Doree Albee, PA-C  Vitamin D, Ergocalciferol, (DRISDOL) 1.25 MG (50000 UNIT) CAPS capsule TAKE 1 CAPSULE BY MOUTH ONE TIME PER WEEK FOR 8 DOSES 05/20/23   Shanelle Clontz,  Eleonore Chiquito, MD    Current Outpatient Medications  Medication Sig Dispense Refill   azelastine (ASTELIN) 0.1 % nasal spray Place 1 spray into both nostrils 2 (two) times daily. Use in each nostril as directed 30 mL 5   cholestyramine light (PREVALITE) 4 GM/DOSE powder Take 1 packet (4 g total) by mouth daily. (Patient not taking: Reported on 06/10/2023) 120 packet 3   cyanocobalamin (VITAMIN B12) 1000 MCG/ML injection Inject 1 mL (1,000 mcg total) into the muscle every 30 (thirty) days. 1000 mL 11    desonide (DESOWEN) 0.05 % cream Apply topically 2 (two) times daily. (Patient not taking: Reported on 06/10/2023)     inFLIXimab (REMICADE) 100 MG injection 10mg /kg every 6 weeks (Patient taking differently: Inject 100 mg into the muscle See admin instructions. 10mg /kg every 6 weeks) 5 each 6   ketoconazole (NIZORAL) 2 % cream SMARTSIG:sparingly Topical Daily (Patient not taking: Reported on 06/10/2023)     MAGNESIUM MALATE PO Take 4,050 mg by mouth daily. 1350 mg each capsule (Patient not taking: Reported on 06/10/2023)     mometasone (NASONEX) 50 MCG/ACT nasal spray Place 2 sprays into the nose daily. (Patient not taking: Reported on 06/10/2023) 1 each 12   ondansetron (ZOFRAN) 4 MG tablet Take 1 tablet (4 mg total) by mouth every 6 (six) hours. (Patient not taking: Reported on 06/10/2023) 12 tablet 0   ondansetron (ZOFRAN-ODT) 8 MG disintegrating tablet Take 1 tablet (8 mg total) by mouth every 8 (eight) hours as needed. (Patient not taking: Reported on 06/10/2023) 60 tablet 1   Vitamin D, Ergocalciferol, (DRISDOL) 1.25 MG (50000 UNIT) CAPS capsule TAKE 1 CAPSULE BY MOUTH ONE TIME PER WEEK FOR 8 DOSES 8 capsule 0   Current Facility-Administered Medications  Medication Dose Route Frequency Provider Last Rate Last Admin   0.9 %  sodium chloride infusion  500 mL Intravenous Continuous Kiana Hollar, Eleonore Chiquito, MD        Allergies as of 06/10/2023   (No Known Allergies)    Family History  Problem Relation Age of Onset   Melanoma Mother    Breast cancer Mother 39   Arthritis Father        and on mothers side   Multiple sclerosis Father    Colon cancer Father 58   Lung cancer Father        smoker   Esophageal cancer Neg Hx    Rectal cancer Neg Hx    Stomach cancer Neg Hx     Social History   Socioeconomic History   Marital status: Married    Spouse name: tom   Number of children: 2   Years of education: Not on file   Highest education level: Not on file  Occupational History   Occupation:  [employer]   Tobacco Use   Smoking status: Never   Smokeless tobacco: Never  Vaping Use   Vaping status: Never Used  Substance and Sexual Activity   Alcohol use: Yes    Comment: occasional   Drug use: No   Sexual activity: Yes    Birth control/protection: Other-see comments  Other Topics Concern   Not on file  Social History Narrative   Not on file   Social Drivers of Health   Financial Resource Strain: Not on file  Food Insecurity: No Food Insecurity (10/04/2022)   Hunger Vital Sign    Worried About Running Out of Food in the Last Year: Never true    Ran Out of Food in the Last  Year: Never true  Transportation Needs: No Transportation Needs (10/04/2022)   PRAPARE - Administrator, Civil Service (Medical): No    Lack of Transportation (Non-Medical): No  Physical Activity: Not on file  Stress: Not on file  Social Connections: Not on file  Intimate Partner Violence: Not At Risk (10/04/2022)   Humiliation, Afraid, Rape, and Kick questionnaire    Fear of Current or Ex-Partner: No    Emotionally Abused: No    Physically Abused: No    Sexually Abused: No    Review of Systems:  All other review of systems negative except as mentioned in the HPI.  Physical Exam: Vital signs in last 24 hours: BP 107/75   Pulse 65   Temp 98 F (36.7 C) (Skin)   Ht 5\' 3"  (1.6 m)   Wt 118 lb (53.5 kg)   LMP 02/17/2020   SpO2 99%   BMI 20.90 kg/m  General:   Alert, NAD Lungs:  Clear .   Heart:  Regular rate and rhythm Abdomen:  Soft, nontender and nondistended. Neuro/Psych:  Alert and cooperative. Normal mood and affect. A and O x 3  Reviewed labs, radiology imaging, old records and pertinent past GI work up  Patient is appropriate for planned procedure(s) and anesthesia in an ambulatory setting   K. Scherry Ran , MD [phone]

## 2023-06-13 ENCOUNTER — Telehealth: Payer: Self-pay | Admitting: *Deleted

## 2023-06-13 ENCOUNTER — Encounter: Payer: Self-pay | Admitting: Gastroenterology

## 2023-06-13 NOTE — Telephone Encounter (Signed)
 Attempted post procedure follow up call.  No answer - LVM.

## 2023-06-14 LAB — SURGICAL PATHOLOGY

## 2023-06-20 ENCOUNTER — Telehealth: Payer: Self-pay | Admitting: *Deleted

## 2023-06-21 NOTE — Telephone Encounter (Signed)
 Faxed demographics and Insurance Information to Dr Alpha Gula office on 06/20/2023.

## 2023-06-28 ENCOUNTER — Encounter: Payer: Self-pay | Admitting: Gastroenterology

## 2023-06-29 NOTE — Telephone Encounter (Signed)
 Patient called stating that she had called about referral to Dr. Alpha Gula office and she stated they have not received anything. Patient is requesting a call back to discuss. Please advise.

## 2023-06-30 NOTE — Telephone Encounter (Signed)
 June 10 th at 8:30 am is patients appointment to see Dr Modesto Charon patient and Dr Ralph Leyden office claimed they received the referral with no coverage page so they just filed it away.... I have confirmation that the fax went with cover page

## 2023-08-02 ENCOUNTER — Ambulatory Visit: Payer: Self-pay | Admitting: Gastroenterology

## 2023-08-22 ENCOUNTER — Ambulatory Visit: Payer: Self-pay

## 2023-08-22 ENCOUNTER — Encounter: Payer: Self-pay | Admitting: Family Medicine

## 2023-08-22 ENCOUNTER — Ambulatory Visit (INDEPENDENT_AMBULATORY_CARE_PROVIDER_SITE_OTHER): Admitting: Family Medicine

## 2023-08-22 VITALS — BP 120/82 | HR 71 | Temp 98.6°F | Ht 63.0 in | Wt 118.0 lb

## 2023-08-22 DIAGNOSIS — S61409A Unspecified open wound of unspecified hand, initial encounter: Secondary | ICD-10-CM

## 2023-08-22 DIAGNOSIS — M79642 Pain in left hand: Secondary | ICD-10-CM

## 2023-08-22 DIAGNOSIS — M79641 Pain in right hand: Secondary | ICD-10-CM | POA: Diagnosis not present

## 2023-08-22 DIAGNOSIS — D849 Immunodeficiency, unspecified: Secondary | ICD-10-CM | POA: Diagnosis not present

## 2023-08-22 DIAGNOSIS — M79644 Pain in right finger(s): Secondary | ICD-10-CM | POA: Diagnosis not present

## 2023-08-22 MED ORDER — ITRACONAZOLE 100 MG PO CAPS: 200.0000 mg | ORAL_CAPSULE | Freq: Every day | ORAL | 0 refills | Status: DC

## 2023-08-22 MED ORDER — CEPHALEXIN 500 MG PO CAPS: 500.0000 mg | ORAL_CAPSULE | Freq: Three times a day (TID) | ORAL | 0 refills | Status: DC

## 2023-08-22 NOTE — Patient Instructions (Signed)
 Although insect or spider bite is possible I am concerned about possible puncture wound from gardening and secondary infection.  As we discussed infection can be bacterial but also fungal infection with rosebushes.  That is typically not as quick of onset but I am concerned with your immunosuppressive medication and possible increased risk of infection.  I did start you on an antibiotic called cephalexin, 3 times per day for the next 10 days along with an antifungal medication 2 pills/day for the next 2 weeks.  Recheck with me in 2 days, be seen sooner or urgent care/ER if acute worsening.  Tylenol  is fine if needed for pain throughout the day, keep areas clean, covered if they do start to open or draining.  Let me know if there are questions and hang in there!

## 2023-08-22 NOTE — Telephone Encounter (Signed)
 FYI appt today for spider bite

## 2023-08-22 NOTE — Progress Notes (Signed)
 Subjective:  Patient ID: Karen Knox, female    DOB: [birth year]  Age: 58 y.o. MRN: [id number]  CC:  Chief Complaint  Patient presents with   Insect Bite    One bite is on left inside of wrist. Other is on right thumb.     HPI [name] presents for   Insect bites? R thumb pain - started last night. No rash/wounds last night. Small red bump early this am, barely visible - 6:30 am., increasing  blister size since then. 1/4" at 10:30, bigger now. gardening yesterday with gloves, no known bite, no spider or bugs noted. Trimming rose bushes.  Left palm lesion noted at 8:30am. Small blister initially, now reddish/purple appearance.  No fever/systemic sx's.  Tylenol  last night for thumb pain - thought to be from work in yard.  Tx: none  Painful, not itching.    Immunization History  Administered Date(s) Administered   DTaP 02/10/2011   Influenza,inj,Quad PF,6+ Mos 11/22/2018, 11/28/2019, 03/31/2021   PFIZER(Purple Top)SARS-COV-2 Vaccination 06/09/2019, 07/01/2019   PPD Test 12/14/2010, 12/22/2012, 12/25/2013   Pneumococcal Polysaccharide-23 02/11/2012, 02/14/2018   Tdap 10/28/2016   Zoster Recombinant(Shingrix ) 12/03/2021, 02/19/2022   PMH noted, on Remicade  for Crohn's disease. Remicade  scheduled this Wednesday, every 6 weeks. - may need to defer with current issue.    History Patient Active Problem List   Diagnosis Date Noted   Nonspecific reaction to cell mediated immunity measurement of gamma interferon antigen response without active tuberculosis 12/22/2022   Crohn's disease with intestinal obstruction (HCC) 10/05/2022   SBO (small bowel obstruction) (HCC) 10/04/2022   IDA (iron  deficiency anemia) 04/03/2021   Hypertriglyceridemia 02/17/2021   Anemia 12/05/2019   Irritable bowel syndrome 12/05/2019   Physical exam 02/14/2018   Vitamin D  deficiency 02/14/2018   Crohn's colitis (HCC) 01/29/2015   Past Medical History:  Diagnosis Date   Anal fissure     Arthritis    ? IBD related?   B12 deficiency    Crohn's disease (HCC)    External hemorrhoids    Gilbert's syndrome    Internal hemorrhoids    Iron  deficiency anemia    Small bowel stricture (HCC)    multiple 07/05/00   TB (tuberculosis)    "latent"- was treated because pt was on Remicade ; October 2016   Past Surgical History:  Procedure Laterality Date   APPENDECTOMY     with initial colon resection including terminal ileum   BREAST BIOPSY Right 06/04/2022   MM RT BREAST BX W LOC DEV 1ST LESION IMAGE BX SPEC STEREO GUIDE 06/04/2022 [facility]   CHOLECYSTECTOMY     COLON RESECTION  1993, 2002   x2   COLONOSCOPY     EXPLORATORY LAPAROTOMY  2002   with extensive lysis of adhesions, resection of ileocolonic anastomosis,resection segment small bowel; anastomosis of small bowel  to small bowel; ileocolic anastomosis   UPPER GASTROINTESTINAL ENDOSCOPY     No Known Allergies Prior to Admission medications   Medication Sig Start Date End Date Taking? Authorizing Provider  azelastine  (ASTELIN ) 0.1 % nasal spray Place 1 spray into both nostrils 2 (two) times daily. Use in each nostril as directed 04/13/23  Yes Jess Morita, MD  cyanocobalamin  (VITAMIN B12) 1000 MCG/ML injection Inject 1 mL (1,000 mcg total) into the muscle every 30 (thirty) days. 12/13/22  Yes Nandigam, Kavitha V, MD  inFLIXimab  (REMICADE ) 100 MG injection 10mg /kg every 6 weeks Patient taking differently: Inject 100 mg into the muscle See admin instructions. 10mg /kg every  6 weeks 09/06/22  Yes Nandigam, Kavitha V, MD  Vitamin D , Ergocalciferol , (DRISDOL ) 1.25 MG (50000 UNIT) CAPS capsule TAKE 1 CAPSULE BY MOUTH ONE TIME PER WEEK FOR 8 DOSES 05/20/23  Yes Nandigam, Kavitha V, MD  cholestyramine  light (PREVALITE ) 4 GM/DOSE powder Take 1 packet (4 g total) by mouth daily. Patient not taking: Reported on 06/10/2023 01/23/21   Nandigam, Kavitha V, MD  desonide (DESOWEN) 0.05 % cream Apply topically 2 (two) times  daily. Patient not taking: Reported on 08/22/2023 05/18/23   [provider]  ketoconazole (NIZORAL) 2 % cream SMARTSIG:sparingly Topical Daily Patient not taking: Reported on 08/22/2023 05/02/23   [provider]  MAGNESIUM MALATE PO Take 4,050 mg by mouth daily. 1350 mg each capsule Patient not taking: Reported on 06/10/2023    [provider]  mometasone  (NASONEX ) 50 MCG/ACT nasal spray Place 2 sprays into the nose daily. Patient not taking: Reported on 08/22/2023 04/13/23   Jess Morita, MD  ondansetron  (ZOFRAN ) 4 MG tablet Take 1 tablet (4 mg total) by mouth every 6 (six) hours. Patient not taking: Reported on 08/22/2023 02/19/23   Spence Dux, PA-C  ondansetron  (ZOFRAN -ODT) 8 MG disintegrating tablet Take 1 tablet (8 mg total) by mouth every 8 (eight) hours as needed. Patient not taking: Reported on 08/22/2023 11/24/22   Edmonia Gottron, PA-C   Social History   Socioeconomic History   Marital status: Married    Spouse name: tom   Number of children: 2   Years of education: Not on file   Highest education level: Not on file  Occupational History   Occupation: [employer]   Tobacco Use   Smoking status: Never   Smokeless tobacco: Never  Vaping Use   Vaping status: Never Used  Substance and Sexual Activity   Alcohol use: Yes    Comment: occasional   Drug use: No   Sexual activity: Yes    Birth control/protection: Other-see comments  Other Topics Concern   Not on file  Social History Narrative   Not on file   Social Drivers of Health   Financial Resource Strain: Not on file  Food Insecurity: No Food Insecurity (10/04/2022)   Hunger Vital Sign    Worried About Running Out of Food in the Last Year: Never true    Ran Out of Food in the Last Year: Never true  Transportation Needs: No Transportation Needs (10/04/2022)   PRAPARE - Administrator, Civil Service (Medical): No    Lack of Transportation (Non-Medical): No  Physical Activity:  Not on file  Stress: Not on file  Social Connections: Not on file  Intimate Partner Violence: Not At Risk (10/04/2022)   Humiliation, Afraid, Rape, and Kick questionnaire    Fear of Current or Ex-Partner: No    Emotionally Abused: No    Physically Abused: No    Sexually Abused: No    Review of Systems Per HPI   Objective:   Vitals:   08/22/23 1310  BP: 120/82  Pulse: 71  Temp: 98.6 F (37 C)  TempSrc: Oral  SpO2: 97%  Weight: 118 lb (53.5 kg)  Height: 5\' 3"  (1.6 m)     Physical Exam Vitals reviewed.  Constitutional:      General: She is not in acute distress.    Appearance: Normal appearance. She is well-developed.  HENT:     Head: Normocephalic and atraumatic.  Cardiovascular:     Rate and Rhythm: Normal rate.  Pulmonary:  Effort: Pulmonary effort is normal.  Skin:    Comments: Rounded enlarged erythematous bullous appearing lesion at the right thumb, 6 mm across.  See photo.  Soft tissue swelling of the thumb noted with some surrounding erythema.  Small 2 to 3 mm vesicular appearing lesion at the left vulvar wrist, ulnar aspect.  See photo.  Neurological:     Mental Status: She is alert and oriented to person, place, and time.  Psychiatric:        Mood and Affect: Mood normal.          Assessment & Plan:  SARAIH LORTON is a 58 y.o. female . Open wound of hand, foreign body presence unspecified, unspecified laterality, unspecified wound type, initial encounter - Plan: cephALEXin (KEFLEX) 500 MG capsule, itraconazole (SPORANOX) 100 MG capsule  Pain of right thumb - Plan: cephALEXin (KEFLEX) 500 MG capsule, itraconazole (SPORANOX) 100 MG capsule  Pain in both hands - Plan: cephALEXin (KEFLEX) 500 MG capsule, itraconazole (SPORANOX) 100 MG capsule  Immunosuppression (HCC) - Plan: cephALEXin (KEFLEX) 500 MG capsule, itraconazole (SPORANOX) 100 MG capsule Initial right thumb pain last night after gardening but no known injury.  Was working with  roses, was wearing gloves, possible puncture injury and is immunosuppressed with use of Remicade .  Progressive swelling, redness, early bulla appearance today with redness of thumb, suspicious for possible cellulitis vs. localized reaction from either bite or puncture.  Did not see a spider or other insect but differential does include spider bite as well. Similar but smaller lesion noted on left hand, again possible puncture with secondary early infection..  Given acute symptoms unlikely sporotrichosis although she was working with roses, immunosuppressed with meds above.  No central ulceration at this time.  No apparent foreign body.  Discussed and plan developed with patient's PCP.  - Up-to-date on tetanus  - Start Keflex 500mg  tid for possible cellulitis  - itraconazole 200 mg daily for 2 weeks for treatment of possible sporotrichosis   - plans on deferring remicade  injection temporarily  - Wound care/symptomatic care with Tylenol  if needed, close follow-up in the next 24 to 48 hours with ER precautions given.  All questions answered      Meds ordered this encounter  Medications   cephALEXin (KEFLEX) 500 MG capsule    Sig: Take 1 capsule (500 mg total) by mouth 3 (three) times daily.    Dispense:  30 capsule    Refill:  0   itraconazole (SPORANOX) 100 MG capsule    Sig: Take 2 capsules (200 mg total) by mouth daily.    Dispense:  14 capsule    Refill:  0   Patient Instructions  Although insect or spider bite is possible I am concerned about possible puncture wound from gardening and secondary infection.  As we discussed infection can be bacterial but also fungal infection with rosebushes.  That is typically not as quick of onset but I am concerned with your immunosuppressive medication and possible increased risk of infection.  I did start you on an antibiotic called cephalexin, 3 times per day for the next 10 days along with an antifungal medication 2 pills/day for the next 2 weeks.   Recheck with me in 2 days, be seen sooner or urgent care/ER if acute worsening.  Tylenol  is fine if needed for pain throughout the day, keep areas clean, covered if they do start to open or draining.  Let me know if there are questions and hang in there!  Signed,   Caro Christmas, MD [facility], [facility] 08/22/23 2:01 PM

## 2023-08-22 NOTE — Telephone Encounter (Signed)
 Copied from CRM [id number]. Topic: Clinical - Red Word Triage >> Aug 22, 2023 10:29 AM [place] A wrote: [facility] that prompted transfer to Nurse Triage: Patient has spider bite on right on hand -bite is swollen, has pain  Chief Complaint: insect bite Symptoms: rapidly-changing bite to thumb, significant swelling and pain to thumb, redness 1/4 inch Frequency: continual Pertinent Negatives: Patient denies SOB, swollen tongue, fever, abdominal pain, chest tightness, changes to urine, vomiting, other rash, hoarseness Disposition: [] 911 / [] ED /[] Urgent Care (no appt availability in office) / [x] Appointment(In office/virtual)/ []  [facility]/ [] Home Care/ [] Refused Recommended Disposition /[] [facility]/ []  Follow-up with PCP Additional Notes: Pt reporting that yesterday she got bitten by an insect that she thinks was a spider, bite on top of thumb, reporting that the bite started out small but has changed drastically over the last 24 hours, now thumb swollen "like a sausage," and pain is significant enough to hinder use of the hand, also redness and big bump now. Pt reporting another bite on other hand but "not quite caught up" to the other though still changing like the worse one. Pt confirms no other symptoms. Advised pt be examined in next 4 hours for symptoms, scheduled with PCP office for today, advised call back if any worsening or new symptoms. Pt verbalized understanding.  Reason for Disposition  [1] SEVERE bite pain AND [2] not improved after 2 hours of pain medicine  Answer Assessment - Initial Assessment Questions 1. TYPE of INSECT: "What type of insect was it?"      Spider? 2. ONSET: "When did you get bitten?"      yesterday 3. LOCATION: "Where is the insect bite located?"      Top of thumb between hand and first knuckle, also bite on left hand does hurt but not as bad and not quite caught up but changing, was wearing garden gloves yesterday 4. REDNESS: "Is the area  red or pink?" If Yes, ask: "What size is area of redness?" (inches or cm). "When did the redness start?"     Redness 1/4 inch maybe slightly less, but started as just a little spot 5. PAIN: "Is there any pain?" If Yes, ask: "How bad is it?"  (Scale 1-10; or mild, moderate, severe)     Was hurting by evening, now swollen, can't bend it, now there's a sore that started as little bump now big bump and red, hurts to pick up a cup, high pain tolerance 7/10 6. ITCHING: "Does it itch?" If Yes, ask: "How bad is the itch?"    - MILD: doesn't interfere with normal activities   - MODERATE-SEVERE: interferes with work, school, sleep, or other activities      No hurts too bad to itch 7. SWELLING: "How big is the swelling?" (inches, cm, or compare to coins)     Can't bend it, like a sausage, if put pressure on it to bend it it turns totally white, it's that tight can't bend it at all 8. OTHER SYMPTOMS: "Do you have any other symptoms?"  (e.g., difficulty breathing, hives)     no  Protocols used: Insect Bite-A-AH

## 2023-08-24 ENCOUNTER — Ambulatory Visit: Admitting: Family Medicine

## 2023-08-24 ENCOUNTER — Encounter: Payer: Self-pay | Admitting: Family Medicine

## 2023-08-24 VITALS — BP 130/80 | HR 63 | Temp 98.0°F | Ht 63.0 in | Wt 118.0 lb

## 2023-08-24 DIAGNOSIS — S61409A Unspecified open wound of unspecified hand, initial encounter: Secondary | ICD-10-CM | POA: Diagnosis not present

## 2023-08-24 DIAGNOSIS — M79644 Pain in right finger(s): Secondary | ICD-10-CM

## 2023-08-24 NOTE — Progress Notes (Signed)
 Subjective:  Patient ID: Karen Knox, female    DOB: October 19, 1965  Age: 58 y.o. MRN: [id number]  CC:  Chief Complaint  Patient presents with   Wound Check    R thumb; doing a lot better, no concerns    HPI MALASIA TORAIN presents for  Follow-up of hand wounds.  See office visit 2 days ago.  Getting much better. Still sore on top of thumb, but swelling has improved. Able to press on area better, and moving thumb., soaking in Epsom salts BID.  Left hand improving - less sore, getting smaller.  No fevers. Minimal clear fluid expressed. No pus.  No side effects with abx, antifungal.     History Patient Active Problem List   Diagnosis Date Noted   Nonspecific reaction to cell mediated immunity measurement of gamma interferon antigen response without active tuberculosis 12/22/2022   Crohn's disease with intestinal obstruction (HCC) 10/05/2022   SBO (small bowel obstruction) (HCC) 10/04/2022   IDA (iron  deficiency anemia) 04/03/2021   Hypertriglyceridemia 02/17/2021   Anemia 12/05/2019   Irritable bowel syndrome 12/05/2019   Physical exam 02/14/2018   Vitamin D  deficiency 02/14/2018   Crohn's colitis (HCC) 01/29/2015   Past Medical History:  Diagnosis Date   Anal fissure    Arthritis    ? IBD related?   B12 deficiency    Crohn's disease (HCC)    External hemorrhoids    Gilbert's syndrome    Internal hemorrhoids    Iron  deficiency anemia    Small bowel stricture (HCC)    multiple 07/05/00   TB (tuberculosis)    "latent"- was treated because pt was on Remicade ; October 2016   Past Surgical History:  Procedure Laterality Date   APPENDECTOMY     with initial colon resection including terminal ileum   BREAST BIOPSY Right 06/04/2022   MM RT BREAST BX W LOC DEV 1ST LESION IMAGE BX SPEC STEREO GUIDE 06/04/2022 [facility]   CHOLECYSTECTOMY     COLON RESECTION  1993, 2002   x2   COLONOSCOPY     EXPLORATORY LAPAROTOMY  2002   with extensive lysis of adhesions,  resection of ileocolonic anastomosis,resection segment small bowel; anastomosis of small bowel  to small bowel; ileocolic anastomosis   UPPER GASTROINTESTINAL ENDOSCOPY     No Known Allergies Prior to Admission medications   Medication Sig Start Date End Date Taking? Authorizing Provider  azelastine  (ASTELIN ) 0.1 % nasal spray Place 1 spray into both nostrils 2 (two) times daily. Use in each nostril as directed 04/13/23  Yes Tabori, Katherine E, MD  cephALEXin (KEFLEX) 500 MG capsule Take 1 capsule (500 mg total) by mouth 3 (three) times daily. 08/22/23  Yes Benjiman Bras, MD  desonide (DESOWEN) 0.05 % cream Apply topically 2 (two) times daily. 05/18/23  Yes [provider]  inFLIXimab  (REMICADE ) 100 MG injection 10mg /kg every 6 weeks Patient taking differently: Inject 100 mg into the muscle See admin instructions. 10mg /kg every 6 weeks 09/06/22  Yes Nandigam, Kavitha V, MD  itraconazole (SPORANOX) 100 MG capsule Take 2 capsules (200 mg total) by mouth daily. 08/22/23  Yes Benjiman Bras, MD  Vitamin D , Ergocalciferol , (DRISDOL ) 1.25 MG (50000 UNIT) CAPS capsule TAKE 1 CAPSULE BY MOUTH ONE TIME PER WEEK FOR 8 DOSES 05/20/23  Yes Nandigam, Kavitha V, MD  cholestyramine  light (PREVALITE ) 4 GM/DOSE powder Take 1 packet (4 g total) by mouth daily. Patient not taking: Reported on 06/10/2023 01/23/21   Nandigam, Kavitha V, MD  cyanocobalamin  (VITAMIN B12) 1000 MCG/ML injection Inject 1 mL (1,000 mcg total) into the muscle every 30 (thirty) days. Patient not taking: Reported on 08/24/2023 12/13/22   Nandigam, Kavitha V, MD  ketoconazole (NIZORAL) 2 % cream SMARTSIG:sparingly Topical Daily Patient not taking: Reported on 06/10/2023 05/02/23   [provider]  MAGNESIUM MALATE PO Take 4,050 mg by mouth daily. 1350 mg each capsule Patient not taking: Reported on 06/10/2023    [provider]  mometasone  (NASONEX ) 50 MCG/ACT nasal spray Place 2 sprays into the nose daily. Patient not  taking: Reported on 06/10/2023 04/13/23   Jess Morita, MD  ondansetron  (ZOFRAN ) 4 MG tablet Take 1 tablet (4 mg total) by mouth every 6 (six) hours. Patient not taking: Reported on 06/10/2023 02/19/23   Spence Dux, PA-C  ondansetron  (ZOFRAN -ODT) 8 MG disintegrating tablet Take 1 tablet (8 mg total) by mouth every 8 (eight) hours as needed. Patient not taking: Reported on 06/10/2023 11/24/22   Edmonia Gottron, PA-C   Social History   Socioeconomic History   Marital status: Married    Spouse name: tom   Number of children: 2   Years of education: Not on file   Highest education level: Not on file  Occupational History   Occupation: [employer]   Tobacco Use   Smoking status: Never   Smokeless tobacco: Never  Vaping Use   Vaping status: Never Used  Substance and Sexual Activity   Alcohol use: Yes    Comment: occasional   Drug use: No   Sexual activity: Yes    Birth control/protection: Other-see comments  Other Topics Concern   Not on file  Social History Narrative   Not on file   Social Drivers of Health   Financial Resource Strain: Not on file  Food Insecurity: No Food Insecurity (10/04/2022)   Hunger Vital Sign    Worried About Running Out of Food in the Last Year: Never true    Ran Out of Food in the Last Year: Never true  Transportation Needs: No Transportation Needs (10/04/2022)   PRAPARE - Administrator, Civil Service (Medical): No    Lack of Transportation (Non-Medical): No  Physical Activity: Not on file  Stress: Not on file  Social Connections: Not on file  Intimate Partner Violence: Not At Risk (10/04/2022)   Humiliation, Afraid, Rape, and Kick questionnaire    Fear of Current or Ex-Partner: No    Emotionally Abused: No    Physically Abused: No    Sexually Abused: No    Review of Systems Per HPI  Objective:   Vitals:   08/24/23 1411  BP: 130/80  Pulse: 63  Temp: 98 F (36.7 C)  SpO2: 98%  Weight: 118 lb (53.5 kg)  Height: 5'  3" (1.6 m)     Physical Exam Constitutional:      General: She is not in acute distress.    Appearance: Normal appearance. She is well-developed.  HENT:     Head: Normocephalic and atraumatic.  Cardiovascular:     Rate and Rhythm: Normal rate.  Pulmonary:     Effort: Pulmonary effort is normal.  Musculoskeletal:     Comments: Right thumb -similar-appearing bullous lesion on dorsum of thumb with slight erythema distally but able to flex and extend IP, able to press on distal thumb without significant discomfort.  She does report this is improved compared to her visit 2 days ago.  No proximal erythema noted.  No vascular streaks.  No other rash or wounds noted on right hand.  Minimal soft tissue swelling of right versus left thumb, see photos.  Similar-appearing lesion of left hand, no surrounding erythema  Skin:    General: Skin is warm and dry.  Neurological:     Mental Status: She is alert and oriented to person, place, and time.  Psychiatric:        Mood and Affect: Mood normal.           Assessment & Plan:  TRAMAINE SNELL is a 58 y.o. female . Open wound of hand, foreign body presence unspecified, unspecified laterality, unspecified wound type, initial encounter  Pain of right thumb  Wounds of right thumb, left hand.  Improved.  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  Differential included insect/spider bite.   - With improvement, and tolerating current med regimen we will continue cephalexin, Itraconazole, will advise PCP of plan and status.  Follow-up as needed at this point as long as she continues to improve but I did request that she send me an update through MyChart in 2 days with picture if possible.  If any new or worsening symptoms, or not continuing to improve, follow-up for recheck.  Understanding expressed.   No orders of  the defined types were placed in this encounter.  Patient Instructions  Thanks for coming in today.  Glad to hear the soreness has improved and no apparent worsening of the wound.  I do think we are on the right track with the current medications.  Continue the antibiotics same dose for now, antifungal same dose for now and if possible send me an update in 2 days on how your thumb and wound is doing.  Upload a picture if possible.  No follow-up appointment needed at this time as long as you are continuing to improve.  However we are happy to see you if any changes or worsening.  Let me know if there are questions and take care.      Signed,   Caro Christmas, MD [facility], [facility] 08/24/23 2:53 PM

## 2023-08-24 NOTE — Patient Instructions (Addendum)
 Thanks for coming in today.  Glad to hear the soreness has improved and no apparent worsening of the wound.  I do think we are on the right track with the current medications.  Continue the antibiotics same dose for now, antifungal same dose for now and if possible send me an update in 2 days on how your thumb and wound is doing.  Upload a picture if possible.  No follow-up appointment needed at this time as long as you are continuing to improve.  However, we are happy to see you if any changes or worsening.  Let me know if there are questions and take care.

## 2023-08-26 ENCOUNTER — Encounter: Payer: Self-pay | Admitting: Family Medicine

## 2023-08-29 NOTE — Telephone Encounter (Signed)
 Patient wanted to inform you on the improvement of her thumb is having tremendous improvement and the swelling has improved as well. Top of knuckle is still red and tender. Pictures were sent in [employer] message.

## 2023-08-30 DIAGNOSIS — K5080 Crohn's disease of both small and large intestine without complications: Secondary | ICD-10-CM | POA: Insufficient documentation

## 2023-08-30 DIAGNOSIS — R1031 Right lower quadrant pain: Secondary | ICD-10-CM | POA: Insufficient documentation

## 2023-10-04 DIAGNOSIS — E538 Deficiency of other specified B group vitamins: Secondary | ICD-10-CM | POA: Insufficient documentation

## 2023-10-09 ENCOUNTER — Other Ambulatory Visit: Payer: Self-pay | Admitting: Family Medicine

## 2023-10-11 ENCOUNTER — Other Ambulatory Visit: Payer: Self-pay | Admitting: Gastroenterology

## 2023-11-01 ENCOUNTER — Other Ambulatory Visit: Payer: Self-pay | Admitting: Family Medicine

## 2023-11-15 ENCOUNTER — Encounter: Payer: Self-pay | Admitting: Family Medicine

## 2023-11-18 ENCOUNTER — Encounter: Payer: Self-pay | Admitting: Family Medicine

## 2023-11-18 ENCOUNTER — Ambulatory Visit: Admitting: Family Medicine

## 2023-11-18 VITALS — BP 102/68 | HR 64 | Temp 97.9°F | Wt 113.0 lb

## 2023-11-18 DIAGNOSIS — R0683 Snoring: Secondary | ICD-10-CM

## 2023-11-18 DIAGNOSIS — R0981 Nasal congestion: Secondary | ICD-10-CM | POA: Diagnosis not present

## 2023-11-18 DIAGNOSIS — J343 Hypertrophy of nasal turbinates: Secondary | ICD-10-CM

## 2023-11-18 MED ORDER — MUPIROCIN 2 % EX OINT: 1.0000 | TOPICAL_OINTMENT | Freq: Two times a day (BID) | CUTANEOUS | 1 refills | Status: AC

## 2023-11-18 NOTE — Progress Notes (Signed)
   Subjective:    Patient ID: Karen Knox, female    DOB: 1965-10-22, 58 y.o.   MRN: [id number]  HPI Sinus congestion- 'my sinuses are still swollen'.  Reports that glasses will leave a notable imprint on nasal bridge.  Also having R ear fullness/itching.  Using Clobetasol  as needed for itching.  Will intermittently have nasal sores- has hx of UC on Remicade .  Began snoring ~3 yrs ago.  Taking Benadryl  at night but not a daily antihistamine.     Review of Systems For ROS see HPI     Objective:   Physical Exam Vitals reviewed.  Constitutional:      General: She is not in acute distress.    Appearance: Normal appearance. She is not ill-appearing.  HENT:     Head: Normocephalic and atraumatic.     Right Ear: Tympanic membrane and ear canal normal.     Left Ear: Tympanic membrane and ear canal normal.     Nose: Mucosal edema and congestion present.     Right Turbinates: Enlarged and swollen.     Left Turbinates: Not enlarged or swollen.     Right Sinus: No maxillary sinus tenderness or frontal sinus tenderness.     Left Sinus: No maxillary sinus tenderness or frontal sinus tenderness.  Neurological:     Mental Status: She is alert.           Assessment & Plan:  Sinus congestion/hypertrophy of nasal turbinates- new.  Will start daily antihistamine to improve congestion.  Add mupirocin  for any nasal sores as they arise (none seen today on exam), and refer to ENT.  Pt expressed understanding and is in agreement w/ plan.

## 2023-11-18 NOTE — Patient Instructions (Addendum)
 Follow up as needed or as scheduled USE the Mupirocin  ointment as needed for nasal sores START Claritin or Zyrtec daily (whichever is on sale) We'll call you to schedule your ENT appt Call with any questions or concerns Stay Safe!  Stay Healthy! Hang in there!!!

## 2023-11-24 ENCOUNTER — Ambulatory Visit: Admitting: Family Medicine

## 2023-12-21 ENCOUNTER — Encounter: Payer: Self-pay | Admitting: Family Medicine

## 2023-12-21 ENCOUNTER — Ambulatory Visit: Admitting: Family Medicine

## 2023-12-21 VITALS — BP 112/70 | Temp 98.2°F | Resp 15 | Ht 62.25 in | Wt 113.4 lb

## 2023-12-21 DIAGNOSIS — E559 Vitamin D deficiency, unspecified: Secondary | ICD-10-CM | POA: Diagnosis not present

## 2023-12-21 DIAGNOSIS — Z23 Encounter for immunization: Secondary | ICD-10-CM

## 2023-12-21 DIAGNOSIS — E781 Pure hyperglyceridemia: Secondary | ICD-10-CM

## 2023-12-21 DIAGNOSIS — E538 Deficiency of other specified B group vitamins: Secondary | ICD-10-CM

## 2023-12-21 DIAGNOSIS — Z114 Encounter for screening for human immunodeficiency virus [HIV]: Secondary | ICD-10-CM

## 2023-12-21 DIAGNOSIS — Z0000 Encounter for general adult medical examination without abnormal findings: Secondary | ICD-10-CM

## 2023-12-21 LAB — HEPATIC FUNCTION PANEL
ALT: 25 U/L (ref 0–35)
AST: 15 U/L (ref 0–37)
Albumin: 3.8 g/dL (ref 3.5–5.2)
Alkaline Phosphatase: 99 U/L (ref 39–117)
Bilirubin, Direct: 0.2 mg/dL (ref 0.0–0.3)
Total Bilirubin: 1.4 mg/dL — ABNORMAL HIGH (ref 0.2–1.2)
Total Protein: 7 g/dL (ref 6.0–8.3)

## 2023-12-21 LAB — LIPID PANEL
Cholesterol: 125 mg/dL (ref 0–200)
HDL: 69.9 mg/dL (ref >39.00)
LDL Cholesterol: 36 mg/dL (ref 0–99)
NonHDL: 54.96
Total CHOL/HDL Ratio: 2
Triglycerides: 96 mg/dL (ref 0.0–149.0)
VLDL: 19.2 mg/dL (ref 0.0–40.0)

## 2023-12-21 LAB — B12 AND FOLATE PANEL
Folate: 9 ng/mL (ref >5.9)
Vitamin B-12: 299 pg/mL (ref 211–911)

## 2023-12-21 LAB — CBC WITH DIFFERENTIAL/PLATELET
Basophils Absolute: 0.1 K/uL (ref 0.0–0.1)
Basophils Relative: 0.5 % (ref 0.0–3.0)
Eosinophils Absolute: 0.6 K/uL (ref 0.0–0.7)
Eosinophils Relative: 4.8 % (ref 0.0–5.0)
HCT: 37.6 % (ref 36.0–46.0)
Hemoglobin: 12.2 g/dL (ref 12.0–15.0)
Lymphocytes Relative: 19.4 % (ref 12.0–46.0)
Lymphs Abs: 2.4 K/uL (ref 0.7–4.0)
MCHC: 32.4 g/dL (ref 30.0–36.0)
MCV: 82.7 fl (ref 78.0–100.0)
Monocytes Absolute: 1.3 K/uL — ABNORMAL HIGH (ref 0.1–1.0)
Monocytes Relative: 10.3 % (ref 3.0–12.0)
Neutro Abs: 8 K/uL — ABNORMAL HIGH (ref 1.4–7.7)
Neutrophils Relative %: 65 % (ref 43.0–77.0)
Platelets: 304 K/uL (ref 150.0–400.0)
RBC: 4.54 Mil/uL (ref 3.87–5.11)
RDW: 14.5 % (ref 11.5–15.5)
WBC: 12.4 K/uL — ABNORMAL HIGH (ref 4.0–10.5)

## 2023-12-21 LAB — BASIC METABOLIC PANEL WITH GFR
BUN: 6 mg/dL (ref 6–23)
CO2: 27 meq/L (ref 19–32)
Calcium: 9.3 mg/dL (ref 8.4–10.5)
Chloride: 102 meq/L (ref 96–112)
Creatinine, Ser: 0.62 mg/dL (ref 0.40–1.20)
GFR: 99.85 mL/min (ref >60.00)
Glucose, Bld: 106 mg/dL — ABNORMAL HIGH (ref 70–99)
Potassium: 3.7 meq/L (ref 3.5–5.1)
Sodium: 138 meq/L (ref 135–145)

## 2023-12-21 LAB — VITAMIN D 25 HYDROXY (VIT D DEFICIENCY, FRACTURES): VITD: 15.33 ng/mL — ABNORMAL LOW (ref 30.00–100.00)

## 2023-12-21 LAB — TSH: TSH: 1.02 u[IU]/mL (ref 0.35–5.50)

## 2023-12-21 NOTE — Assessment & Plan Note (Signed)
 Pt's PE WNL.  UTD on pap, mammo, colonoscopy, Tdap.  Pneumonia shot given.  Check labs.  Anticipatory guidance provided.

## 2023-12-21 NOTE — Patient Instructions (Addendum)
 Follow up in 1 year or as needed We'll notify you of your lab results and make any changes if needed Keep up the good work on healthy diet and regular exercise- you look AMAZING!! Call with any questions or concerns Stay Safe!  Stay Healthy! HAPPY BIRTHDAY!!!

## 2023-12-21 NOTE — Progress Notes (Signed)
   Subjective:    Patient ID: Karen Knox, female    DOB: 05-18-1965, 58 y.o.   MRN: [id number]  HPI CPE- UTD on pap (requested records), UTD on mammo, colonoscopy, Tdap.  Patient Care Team    Relationship Specialty Notifications Start End  Mahlon Comer FORBES, MD PCP - General Family Medicine  02/14/18   Mai Lynwood FALCON, MD Consulting Physician Rheumatology  02/14/18   Dannielle Bouchard, DO Consulting Physician Obstetrics and Gynecology  02/14/18   Shila Gustav GAILS, MD Consulting Physician Gastroenterology  02/14/18     Health Maintenance  Topic Date Due   HIV Screening  Never done   Hepatitis B Vaccines 19-59 Average Risk (1 of 3 - 19+ 3-dose series) Never done   Pneumococcal Vaccine: 50+ Years (3 of 3 - PCV) 02/15/2019   Cervical Cancer Screening (HPV/Pap Cotest)  05/16/2022   COVID-19 Vaccine (3 - Pfizer risk series) 01/06/2024 (Originally 07/29/2019)   Influenza Vaccine  06/19/2024 (Originally 10/21/2023)   Mammogram  05/18/2024   Colonoscopy  06/10/2026   DTaP/Tdap/Td (3 - Td or Tdap) 10/29/2026   Hepatitis C Screening  Completed   Zoster Vaccines- Shingrix   Completed   HPV VACCINES  Aged Out   Meningococcal B Vaccine  Aged Out      Review of Systems Patient reports no vision/ hearing changes, adenopathy,fever, weight change,  persistant/recurrent hoarseness , swallowing issues, chest pain, palpitations, edema, persistant/recurrent cough, hemoptysis, dyspnea (rest/exertional/paroxysmal nocturnal), gastrointestinal bleeding (melena, rectal bleeding), abdominal pain, significant heartburn, bowel changes, GU symptoms (dysuria, hematuria, incontinence), Gyn symptoms (abnormal  bleeding, pain),  syncope, focal weakness, memory loss, numbness & tingling, skin/hair/nail changes, abnormal bruising or bleeding, anxiety, or depression.     Objective:   Physical Exam General Appearance:    Alert, cooperative, no distress, appears stated age  Head:    Normocephalic, without obvious  abnormality, atraumatic  Eyes:    PERRL, conjunctiva/corneas clear, EOM's intact both eyes  Ears:    Normal TM's and external ear canals, both ears  Nose:   Nares normal, septum midline, mucosa normal, no drainage    or sinus tenderness  Throat:   Lips, mucosa, and tongue normal; teeth and gums normal  Neck:   Supple, symmetrical, trachea midline, no adenopathy;    Thyroid : no enlargement/tenderness/nodules  Back:     Symmetric, no curvature, ROM normal, no CVA tenderness  Lungs:     Clear to auscultation bilaterally, respirations unlabored  Chest Wall:    No tenderness or deformity   Heart:    Regular rate and rhythm, S1 and S2 normal, no murmur, rub   or gallop  Breast Exam:    Deferred to GYN  Abdomen:     Soft, non-tender, bowel sounds active all four quadrants,    no masses, no organomegaly  Genitalia:    Deferred to GYN  Rectal:    Extremities:   Extremities normal, atraumatic, no cyanosis or edema  Pulses:   2+ and symmetric all extremities  Skin:   Skin color, texture, turgor normal, no rashes or lesions  Lymph nodes:   Cervical, supraclavicular, and axillary nodes normal  Neurologic:   CNII-XII intact, normal strength, sensation and reflexes    throughout          Assessment & Plan:

## 2023-12-22 ENCOUNTER — Ambulatory Visit: Payer: Self-pay | Admitting: Family Medicine

## 2023-12-22 LAB — HIV ANTIBODY (ROUTINE TESTING W REFLEX)
HIV 1&2 Ab, 4th Generation: NONREACTIVE
HIV FINAL INTERPRETATION: NEGATIVE

## 2023-12-22 MED ORDER — VITAMIN D (ERGOCALCIFEROL) 1.25 MG (50000 UNIT) PO CAPS: 50000.0000 [IU] | ORAL_CAPSULE | ORAL | 0 refills | Status: AC

## 2023-12-22 NOTE — Telephone Encounter (Signed)
-----   Message from Karen Knox sent at 12/22/2023  7:40 AM EDT ----- Labs look good w/ exception of low Vit D.  Based on this, we need to start 50,000 units weekly x12 weeks in addition to daily OTC supplement of at least 2000 units.   Your white blood cells are elevated but this is likely due to your post-op cellulitis ----- Message ----- From: Interface, Lab In Three Zero One Sent: 12/21/2023   9:27 PM EDT To: Karen FORBES Greet, MD

## 2023-12-22 NOTE — Telephone Encounter (Signed)
 Called patient and left vm to return call   Sent in Vit D to pharmacy

## 2023-12-23 NOTE — Progress Notes (Signed)
Patient viewed results on mychart.

## 2023-12-26 ENCOUNTER — Encounter: Payer: Self-pay | Admitting: Gastroenterology

## 2023-12-28 NOTE — Telephone Encounter (Signed)
 Please send prescription for vitamin B12 1000 mcg injection every 2 weeks with refills for 1 year.  Thanks

## 2023-12-29 MED ORDER — CYANOCOBALAMIN 1000 MCG/ML IJ SOLN: 1000.0000 ug | INTRAMUSCULAR | 26 refills | Status: DC

## 2024-01-18 ENCOUNTER — Ambulatory Visit (INDEPENDENT_AMBULATORY_CARE_PROVIDER_SITE_OTHER)

## 2024-01-18 ENCOUNTER — Telehealth: Payer: Self-pay | Admitting: Family Medicine

## 2024-01-18 ENCOUNTER — Encounter (INDEPENDENT_AMBULATORY_CARE_PROVIDER_SITE_OTHER): Payer: Self-pay

## 2024-01-18 VITALS — BP 126/87 | HR 70 | Temp 98.4°F | Ht 63.0 in | Wt 115.0 lb

## 2024-01-18 DIAGNOSIS — R0683 Snoring: Secondary | ICD-10-CM | POA: Diagnosis not present

## 2024-01-18 DIAGNOSIS — R0981 Nasal congestion: Secondary | ICD-10-CM

## 2024-01-18 DIAGNOSIS — J342 Deviated nasal septum: Secondary | ICD-10-CM | POA: Diagnosis not present

## 2024-01-18 DIAGNOSIS — H608X3 Other otitis externa, bilateral: Secondary | ICD-10-CM | POA: Diagnosis not present

## 2024-01-18 MED ORDER — FLUOCINOLONE ACETONIDE 0.01 % EX CREA: TOPICAL_CREAM | Freq: Two times a day (BID) | CUTANEOUS | 0 refills | Status: AC

## 2024-01-18 NOTE — Telephone Encounter (Signed)
 Placed in folder at nurse station

## 2024-01-18 NOTE — Progress Notes (Signed)
 Dear Dr. Mahlon, Here is my assessment for our mutual patient, Karen Knox. Thank you for allowing me the opportunity to care for your patient. Please do not hesitate to contact me should you have any other questions. Sincerely, Dr. Penne Croak  Otolaryngology Clinic Note Referring provider: Dr. Mahlon HPI:  Discussed the use of AI scribe software for clinical note transcription with the patient, who gave verbal consent to proceed.  History of Present Illness Karen Knox is a 58 year old female with Crohn's disease who presents with post-surgical snoring and ear irritation.  Snoring - Snoring began approximately two years ago, coinciding with early signs of bowel blockage related to Crohn's disease - Snoring was not a significant issue prior to this period - Snoring subsided after bowel surgery in August 2025 but has recently recurred - Currently unable to sleep on her stomach due to ongoing post-surgical healing, now sleeps on her back, which she believes contributes to snoring  Aural pruritus and xerosis - Dry, itchy ears with flaking skin - Regular use of Q-tips for ear cleaning - Uses Zyrtec daily, uncertain of its effectiveness for ear symptoms  Nasal congestion - History of nasal congestion - Previously used steroid and antihistamine nasal sprays, but discontinued post-surgery due to lack of perceived benefit - Prefers oral medications over nasal sprays  Post-surgical wound infection - Currently on antibiotics for a post-surgical infection, which is her second occurrence - Infection described as nearly healed before closing up and becoming inflamed again - Ongoing infection has impacted her ability to sleep on her stomach, her preferred sleeping position prior to surgery   Independent Review of Additional Tests or Records:  Reviewed external note from referring PCP, Tabori,describing relevant history incorporated into today's evaluation.     PMH/Meds/All/SocHx/FamHx/ROS:   Past Medical History:  Diagnosis Date   Anal fissure    Arthritis    ? IBD related?   B12 deficiency    Crohn's disease (HCC)    External hemorrhoids    Gilbert's syndrome    Internal hemorrhoids    Iron  deficiency anemia    Small bowel stricture (HCC)    multiple 07/05/00   TB (tuberculosis)    latent- was treated because pt was on Remicade ; October 2016     Past Surgical History:  Procedure Laterality Date   APPENDECTOMY     with initial colon resection including terminal ileum   BREAST BIOPSY Right 06/04/2022   MM RT BREAST BX W LOC DEV 1ST LESION IMAGE BX SPEC STEREO GUIDE 06/04/2022 [facility]   CHOLECYSTECTOMY     COLON RESECTION  1993, 2002   x2   COLONOSCOPY     EXPLORATORY LAPAROTOMY  2002   with extensive lysis of adhesions, resection of ileocolonic anastomosis,resection segment small bowel; anastomosis of small bowel  to small bowel; ileocolic anastomosis   UPPER GASTROINTESTINAL ENDOSCOPY      Family History  Problem Relation Age of Onset   Melanoma Mother    Breast cancer Mother 92   Arthritis Father        and on mothers side   Multiple sclerosis Father    Colon cancer Father 66   Lung cancer Father        smoker   Esophageal cancer Neg Hx    Rectal cancer Neg Hx    Stomach cancer Neg Hx      Social Connections: Moderately Isolated (12/17/2023)   Social Connection and Isolation Panel    Frequency of  Communication with Friends and Family: More than three times a week    Frequency of Social Gatherings with Friends and Family: Twice a week    Attends Religious Services: Patient declined    Database Administrator or Organizations: No    Attends Engineer, Structural: Not on file    Marital Status: Married      Current Outpatient Medications:    Azelastine  HCl 137 MCG/SPRAY SOLN, PLACE 1 SPRAY INTO BOTH NOSTRILS 2 (TWO) TIMES DAILY. USE IN EACH NOSTRIL AS DIRECTED, Disp: 137 mL, Rfl: 1    clobetasol  (TEMOVATE ) 0.05 % external solution, Apply 1 Application topically., Disp: , Rfl:    cyanocobalamin  (VITAMIN B12) 1000 MCG/ML injection, Inject 1 mL (1,000 mcg total) into the muscle every 14 (fourteen) days., Disp: 1 mL, Rfl: 26   enoxaparin  (LOVENOX ) 40 MG/0.4ML injection, Inject 40 mg into the skin daily., Disp: , Rfl:    erythromycin base (E-MYCIN) 500 MG tablet, Take by mouth., Disp: , Rfl:    fluocinolone 0.01 % cream, Apply topically 2 (two) times daily. Use for one week and then as needed, Disp: 30 g, Rfl: 0   inFLIXimab  (REMICADE ) 100 MG injection, 10mg /kg every 6 weeks (Patient taking differently: Inject 100 mg into the muscle See admin instructions. 10mg /kg every 6 weeks), Disp: 5 each, Rfl: 6   methocarbamol (ROBAXIN) 500 MG tablet, Take 500 mg by mouth 4 (four) times daily., Disp: , Rfl:    mupirocin  ointment (BACTROBAN ) 2 %, Apply 1 Application topically 2 (two) times daily., Disp: 30 g, Rfl: 1   ondansetron  (ZOFRAN ) 4 MG tablet, Take 1 tablet (4 mg total) by mouth every 6 (six) hours., Disp: 12 tablet, Rfl: 0   ondansetron  (ZOFRAN -ODT) 8 MG disintegrating tablet, Take 1 tablet (8 mg total) by mouth every 8 (eight) hours as needed., Disp: 60 tablet, Rfl: 1   Vitamin D , Ergocalciferol , (DRISDOL ) 1.25 MG (50000 UNIT) CAPS capsule, TAKE 1 CAPSULE BY MOUTH ONE TIME PER WEEK FOR 8 DOSES, Disp: 8 capsule, Rfl: 0   Vitamin D , Ergocalciferol , (DRISDOL ) 1.25 MG (50000 UNIT) CAPS capsule, Take 1 capsule (50,000 Units total) by mouth every 7 (seven) days., Disp: 12 capsule, Rfl: 0   Physical Exam:   BP 126/87 (BP Location: Left Arm, Patient Position: Sitting, Cuff Size: Normal)   Pulse 70   Temp 98.4 F (36.9 C) (Oral)   Ht 5' 3 (1.6 m)   Wt 115 lb (52.2 kg)   LMP 02/17/2020   SpO2 97%   BMI 20.37 kg/m   The patient was awake, alert, and appropriate. The external ears were inspected, and otoscopy was performed to evaluate the external auditory canals and tympanic membranes.  The nasal cavity and septum were examined for mucosal changes, obstruction, or discharge. The oral cavity and oropharynx were inspected for mucosal lesions, infection, or tonsillar hypertrophy. The neck was palpated for lymphadenopathy, thyroid  abnormalities, or other masses. Cranial nerve function was grossly intact.  Pertinent Findings: Physical Exam HEENT: Tympanic membranes healthy and intact, no infection. Ear canals dry, resembling eczema. Nasal septum slightly deviated. Turbinates normal. Sinus drainage normal. Oral cavity normal, dentition good.   Seprately Identifiable Procedures:  I personally ordered, reviewed and interpreted the following with the patient today  Procedure: Bilateral ear microscopy using microscope (CPT [phone]) Pre-procedure diagnosis: ear itch Post-procedure diagnosis: same Indication: see above; given patient's otologic complaints and history, for improved and comprehensive examination of external ear and tympanic membrane, bilateral otologic examination using microscope was  performed. Prior to proceeding, verbal consent was obtained after discussion of R/B/A  Procedure: Patient was placed semi-recumbent. Both ear canals were examined using the microscope with findings below. Patient tolerated the procedure well.  Right ear:  No significant lesions pinna. EAC: no significant lesions. Canal is clear. Eczematoid changes. minimal TM: Intact   Left ear:  No significant lesions pinna. EAC: no significant lesions. Canal is clear. Eczematoid changes. minimal TM: Intact    Impression & Plans:  Rikayla Makki is a 58 y.o. female  1. Chronic eczematous otitis externa of both ears   2. Nasal congestion   3. DNS (deviated nasal septum)     - Findings and diagnoses discussed in detail with the patient. - Risks, benefits, and alternatives were reviewed. Through shared decision making, the patient elects to proceed with below. Assessment & Plan Bilateral ear canal  eczema Chronic dryness and itching in both ear canals, consistent with eczema. No infection. Symptoms exacerbated by Q-tip use. - Prescribed fluocinolone 0.1% cream for application to ear canals twice daily for 5-7 days, then reduce frequency as symptoms improve. - Advised against Q-tip use in ear canals. - Recommended using baby oil, sweet oil, mineral oil, or olive oil on a Q-tip if necessary to maintain moisture.  Nasal congestion and deviated nasal septum Mild septal deviation with no significant obstruction. Nasal congestion not severe, previous nasal spray use was not sustained. - Discussed option of steroid nasal spray if congestion worsens, emphasizing consistent use over three weeks. - Consider referral to allergist if symptoms persist or worsen.  Snoring Snoring likely due to inability to sleep on stomach post-surgery. Nasal airflow adequate, not primarily due to nasal obstruction. - Advised on potential benefit of changing sleeping position once able to resume stomach sleeping post-recovery.  - Orders placed:  Orders Placed This Encounter  Procedures   Ambulatory referral to Audiology   - Medications prescribed/continued/adjusted:  Meds ordered this encounter  Medications   fluocinolone 0.01 % cream    Sig: Apply topically 2 (two) times daily. Use for one week and then as needed    Dispense:  30 g    Refill:  0   - Education materials provided to the patient. - Follow up: 6 months with hearing test. Patient instructed to return sooner or go to the ED if new/worsening symptoms develop.   Thank you for allowing me the opportunity to care for your patient. Please do not hesitate to contact me should you have any other questions.  Sincerely, Penne Croak, DO Otolaryngologist (ENT) [facility] Phone: [phone] Fax: [phone]  01/18/2024, 11:32 AM

## 2024-01-18 NOTE — Telephone Encounter (Signed)
 Type of form received: [employer] Wellness (Physical) Employee Form  Additional comments: CPE was completed on 12/21/23  Received by: Patient  Form should be Faxed/mailed to: (address/ fax #) N/A  Is patient requesting call for pickup: Yes  Form placed:  Labeled & placed in provider bin  Attach charge sheet.  Provider will determine charge.  Individual made aware of 3-5 business day turn around? Yes

## 2024-01-19 NOTE — Telephone Encounter (Signed)
 Form completed and returned to [place]

## 2024-01-19 NOTE — Telephone Encounter (Signed)
 Faxed and placed in scan bin

## 2024-01-20 NOTE — Telephone Encounter (Signed)
 Patient has been notified forms are ready to be picked up. Patient stated she will try to come by today. Forms are in pick up bin

## 2024-02-06 ENCOUNTER — Other Ambulatory Visit: Payer: Self-pay

## 2024-02-06 ENCOUNTER — Encounter: Payer: Self-pay | Admitting: Gastroenterology

## 2024-02-06 MED ORDER — CYANOCOBALAMIN 1000 MCG/ML IJ SOLN: 1000.0000 ug | INTRAMUSCULAR | 3 refills | Status: AC

## 2024-03-07 ENCOUNTER — Encounter: Admitting: Family Medicine

## 2024-03-13 ENCOUNTER — Other Ambulatory Visit: Payer: Self-pay | Admitting: Family Medicine

## 2024-03-13 NOTE — Telephone Encounter (Signed)
 Okay to refill?

## 2024-04-19 ENCOUNTER — Telehealth (INDEPENDENT_AMBULATORY_CARE_PROVIDER_SITE_OTHER): Payer: Self-pay

## 2024-04-19 NOTE — Telephone Encounter (Signed)
 Left voicemail to reschedule both appointments on 4/29 due to Anice being out of office

## 2024-05-04 ENCOUNTER — Ambulatory Visit: Admitting: Physician Assistant

## 2024-07-18 ENCOUNTER — Ambulatory Visit (INDEPENDENT_AMBULATORY_CARE_PROVIDER_SITE_OTHER): Admitting: Audiology

## 2024-07-18 ENCOUNTER — Ambulatory Visit (INDEPENDENT_AMBULATORY_CARE_PROVIDER_SITE_OTHER)

## 2024-12-21 ENCOUNTER — Encounter: Admitting: Family Medicine
